# Patient Record
Sex: Male | Born: 1955 | Race: White | Hispanic: No | Marital: Married | Smoking: Never smoker
Health system: Southern US, Community
[De-identification: ages and names within clinical notes are randomized; demographics above are authoritative.]

## PROBLEM LIST (undated history)

## (undated) DIAGNOSIS — I219 Acute myocardial infarction, unspecified: Secondary | ICD-10-CM

## (undated) DIAGNOSIS — Z889 Allergy status to unspecified drugs, medicaments and biological substances status: Secondary | ICD-10-CM

## (undated) DIAGNOSIS — E785 Hyperlipidemia, unspecified: Secondary | ICD-10-CM

## (undated) DIAGNOSIS — T7840XA Allergy, unspecified, initial encounter: Secondary | ICD-10-CM

## (undated) DIAGNOSIS — I10 Essential (primary) hypertension: Secondary | ICD-10-CM

## (undated) DIAGNOSIS — F419 Anxiety disorder, unspecified: Secondary | ICD-10-CM

## (undated) DIAGNOSIS — I2510 Atherosclerotic heart disease of native coronary artery without angina pectoris: Secondary | ICD-10-CM

## (undated) HISTORY — DX: Allergy, unspecified, initial encounter: T78.40XA

## (undated) HISTORY — DX: Hyperlipidemia, unspecified: E78.5

## (undated) HISTORY — PX: CORONARY ANGIOPLASTY: SHX604

---

## 2008-03-01 DIAGNOSIS — I219 Acute myocardial infarction, unspecified: Secondary | ICD-10-CM

## 2008-03-01 HISTORY — DX: Acute myocardial infarction, unspecified: I21.9

## 2009-01-27 ENCOUNTER — Encounter: Payer: Self-pay | Admitting: Emergency Medicine

## 2009-01-27 ENCOUNTER — Inpatient Hospital Stay (HOSPITAL_COMMUNITY): Admission: EM | Admit: 2009-01-27 | Discharge: 2009-01-29 | Payer: Self-pay | Admitting: Cardiovascular Disease

## 2009-01-27 HISTORY — PX: CARDIAC CATHETERIZATION: SHX172

## 2009-02-24 ENCOUNTER — Encounter (HOSPITAL_COMMUNITY): Admission: RE | Admit: 2009-02-24 | Discharge: 2009-02-28 | Payer: Self-pay | Admitting: Cardiovascular Disease

## 2009-03-03 ENCOUNTER — Encounter (HOSPITAL_COMMUNITY): Admission: RE | Admit: 2009-03-03 | Discharge: 2009-04-02 | Payer: Self-pay | Admitting: Cardiovascular Disease

## 2009-04-03 ENCOUNTER — Encounter (HOSPITAL_COMMUNITY): Admission: RE | Admit: 2009-04-03 | Discharge: 2009-05-03 | Payer: Self-pay | Admitting: Cardiovascular Disease

## 2009-04-16 HISTORY — PX: TRANSTHORACIC ECHOCARDIOGRAM: SHX275

## 2009-04-16 HISTORY — PX: CARDIOVASCULAR STRESS TEST: SHX262

## 2009-05-05 ENCOUNTER — Encounter (HOSPITAL_COMMUNITY): Admission: RE | Admit: 2009-05-05 | Discharge: 2009-06-04 | Payer: Self-pay | Admitting: Cardiovascular Disease

## 2010-06-02 LAB — CARDIAC PANEL(CRET KIN+CKTOT+MB+TROPI)
Relative Index: 4.3 — ABNORMAL HIGH (ref 0.0–2.5)
Troponin I: 5.99 ng/mL (ref 0.00–0.06)

## 2010-06-03 LAB — POCT CARDIAC MARKERS
CKMB, poc: 1.1 ng/mL (ref 1.0–8.0)
Myoglobin, poc: 81.9 ng/mL (ref 12–200)
Myoglobin, poc: 85.1 ng/mL (ref 12–200)
Troponin i, poc: <0.05 ng/mL (ref 0.00–0.09)

## 2010-06-03 LAB — COMPREHENSIVE METABOLIC PANEL
ALT: 40 U/L (ref 0–53)
AST: 98 U/L — ABNORMAL HIGH (ref 0–37)
Albumin: 3.7 g/dL (ref 3.5–5.2)
Albumin: 4.5 g/dL (ref 3.5–5.2)
BUN: 13 mg/dL (ref 6–23)
Calcium: 8.7 mg/dL (ref 8.4–10.5)
Calcium: 9.1 mg/dL (ref 8.4–10.5)
Chloride: 106 mEq/L (ref 96–112)
Creatinine, Ser: 0.98 mg/dL (ref 0.4–1.5)
GFR calc Af Amer: >60 mL/min (ref >60)
GFR calc Af Amer: >60 mL/min (ref >60)
GFR calc non Af Amer: >60 mL/min (ref >60)
Sodium: 140 mEq/L (ref 135–145)
Total Bilirubin: 1 mg/dL (ref 0.3–1.2)
Total Protein: 5.7 g/dL — ABNORMAL LOW (ref 6.0–8.3)

## 2010-06-03 LAB — CARDIAC PANEL(CRET KIN+CKTOT+MB+TROPI)
CK, MB: 134.3 ng/mL — ABNORMAL HIGH (ref 0.3–4.0)
CK, MB: 143 ng/mL — ABNORMAL HIGH (ref 0.3–4.0)
Relative Index: 13.3 — ABNORMAL HIGH (ref 0.0–2.5)
Relative Index: 13.4 — ABNORMAL HIGH (ref 0.0–2.5)
Total CK: 1010 U/L — ABNORMAL HIGH (ref 7–232)
Total CK: 768 U/L — ABNORMAL HIGH (ref 7–232)
Troponin I: 14.34 ng/mL (ref 0.00–0.06)

## 2010-06-03 LAB — CBC
MCHC: 34.6 g/dL (ref 30.0–36.0)
MCHC: 35.1 g/dL (ref 30.0–36.0)
MCV: 95.7 fL (ref 78.0–100.0)
Platelets: 189 10*3/uL (ref 150–400)
RBC: 4.34 MIL/uL (ref 4.22–5.81)
RDW: 12.6 % (ref 11.5–15.5)
WBC: 7.5 10*3/uL (ref 4.0–10.5)

## 2010-06-03 LAB — CK TOTAL AND CKMB (NOT AT ARMC)
CK, MB: 49.7 ng/mL — ABNORMAL HIGH (ref 0.3–4.0)
Relative Index: 11.8 — ABNORMAL HIGH (ref 0.0–2.5)
Total CK: 420 U/L — ABNORMAL HIGH (ref 7–232)

## 2010-06-03 LAB — HEMOGLOBIN A1C
Hgb A1c MFr Bld: 5.7 % (ref 4.6–6.1)
Mean Plasma Glucose: 117 mg/dL

## 2010-06-03 LAB — BASIC METABOLIC PANEL
BUN: 16 mg/dL (ref 6–23)
Calcium: 8.6 mg/dL (ref 8.4–10.5)
Chloride: 105 mEq/L (ref 96–112)
Creatinine, Ser: 1.07 mg/dL (ref 0.4–1.5)
GFR calc Af Amer: >60 mL/min (ref >60)

## 2010-06-03 LAB — APTT: aPTT: 26 seconds (ref 24–37)

## 2010-06-03 LAB — DIFFERENTIAL
Basophils Relative: 0 % (ref 0–1)
Eosinophils Absolute: 0.1 10*3/uL (ref 0.0–0.7)
Lymphs Abs: 2.2 10*3/uL (ref 0.7–4.0)
Neutro Abs: 4.6 10*3/uL (ref 1.7–7.7)
Neutrophils Relative %: 61 % (ref 43–77)

## 2010-06-03 LAB — LIPID PANEL
HDL: 35 mg/dL — ABNORMAL LOW (ref >39)
Total CHOL/HDL Ratio: 5.3 RATIO
VLDL: 14 mg/dL (ref 0–40)

## 2010-06-03 LAB — TSH: TSH: 2.161 u[IU]/mL (ref 0.350–4.500)

## 2010-12-09 ENCOUNTER — Emergency Department (HOSPITAL_COMMUNITY)
Admission: EM | Admit: 2010-12-09 | Discharge: 2010-12-09 | Disposition: A | Discharge status: Home / Self Care | Payer: PRIVATE HEALTH INSURANCE | Attending: Emergency Medicine | Admitting: Emergency Medicine

## 2010-12-09 ENCOUNTER — Encounter: Payer: Self-pay | Admitting: *Deleted

## 2010-12-09 DIAGNOSIS — W268XXA Contact with other sharp object(s), not elsewhere classified, initial encounter: Secondary | ICD-10-CM | POA: Insufficient documentation

## 2010-12-09 DIAGNOSIS — I2510 Atherosclerotic heart disease of native coronary artery without angina pectoris: Secondary | ICD-10-CM | POA: Insufficient documentation

## 2010-12-09 DIAGNOSIS — S0100XA Unspecified open wound of scalp, initial encounter: Secondary | ICD-10-CM | POA: Insufficient documentation

## 2010-12-09 DIAGNOSIS — Z7982 Long term (current) use of aspirin: Secondary | ICD-10-CM | POA: Insufficient documentation

## 2010-12-09 DIAGNOSIS — S0101XA Laceration without foreign body of scalp, initial encounter: Secondary | ICD-10-CM

## 2010-12-09 DIAGNOSIS — I252 Old myocardial infarction: Secondary | ICD-10-CM | POA: Insufficient documentation

## 2010-12-09 DIAGNOSIS — R079 Chest pain, unspecified: Secondary | ICD-10-CM | POA: Insufficient documentation

## 2010-12-09 HISTORY — DX: Acute myocardial infarction, unspecified: I21.9

## 2010-12-09 HISTORY — DX: Atherosclerotic heart disease of native coronary artery without angina pectoris: I25.10

## 2010-12-09 MED ORDER — TETANUS-DIPHTH-ACELL PERTUSSIS 5-2.5-18.5 LF-MCG/0.5 IM SUSP
0.5000 mL | Freq: Once | INTRAMUSCULAR | Status: AC
  Administered 2010-12-09: 0.5 mL via INTRAMUSCULAR
  Filled 2010-12-09 (×2): qty 0.5

## 2010-12-09 NOTE — ED Provider Notes (Signed)
Medical screening examination/treatment/procedure(s) were performed by non-physician practitioner and as supervising physician I was immediately available for consultation/collaboration. Devoria Albe, MD, FACEP   Ward Givens, MD 12/09/10 [phone]

## 2010-12-09 NOTE — ED Provider Notes (Signed)
History     CSN: [id number] Arrival date & time: 12/09/2010  6:04 PM  Chief Complaint  Patient presents with  . Head Laceration    (Consider location/radiation/quality/duration/timing/severity/associated sxs/prior treatment) Patient is a 55 y.o. male presenting with scalp laceration. The history is provided by the patient.  Head Laceration This is a new problem. The current episode started today. The problem has been unchanged. Associated symptoms include chest pain. Pertinent negatives include no abdominal pain, arthralgias, coughing or neck pain. Associated symptoms comments: bleeding. The symptoms are aggravated by nothing. Treatments tried: direct pressure. The treatment provided mild relief.    Past Medical History  Diagnosis Date  . MI (myocardial infarction)   . Coronary artery disease     Past Surgical History  Procedure Date  . Coronary angioplasty with stent placement     No family history on file.  History  Substance Use Topics  . Smoking status: Never Smoker   . Smokeless tobacco: Not on file  . Alcohol Use: No      Review of Systems  Constitutional: Negative for activity change.       All ROS Neg except as noted in HPI  HENT: Negative for nosebleeds and neck pain.   Eyes: Negative for photophobia and discharge.  Respiratory: Negative for cough, shortness of breath and wheezing.   Cardiovascular: Positive for chest pain. Negative for palpitations.  Gastrointestinal: Negative for abdominal pain and blood in stool.  Genitourinary: Negative for dysuria, frequency and hematuria.  Musculoskeletal: Negative for back pain and arthralgias.  Skin: Negative.   Neurological: Negative for dizziness, seizures and speech difficulty.  Psychiatric/Behavioral: Negative for hallucinations and confusion.    Allergies  Review of patient's allergies indicates no known allergies.  Home Medications   Current Outpatient Rx  Name Route Sig Dispense Refill  . ASPIRIN EC  81 MG PO TBEC Oral Take 81 mg by mouth daily.      Marland Kitchen CETIRIZINE HCL 10 MG PO TABS Oral Take 10 mg by mouth daily.      Marland Kitchen CLOPIDOGREL BISULFATE 75 MG PO TABS Oral Take 75 mg by mouth at bedtime.      Marland Kitchen EZETIMIBE-SIMVASTATIN 10-40 MG PO TABS Oral Take 1 tablet by mouth at bedtime.      . IBUPROFEN 200 MG PO TABS Oral Take 400 mg by mouth as needed. For pain     . METOPROLOL TARTRATE 25 MG PO TABS Oral Take 6.25 mg by mouth 2 (two) times daily.      . CENTRUM PO LIQD Oral Take 30 mLs by mouth daily.        BP 136/81  Pulse 71  Temp(Src) 97.9 F (36.6 C) (Oral)  Resp 20  Ht 5\' 10"  (1.778 m)  Wt 212 lb (96.163 kg)  BMI 30.42 kg/m2  SpO2 98%  Physical Exam  Nursing note and vitals reviewed. Constitutional: He is oriented to person, place, and time. He appears well-developed and well-nourished.  Non-toxic appearance.  HENT:  Head: Normocephalic.  Right Ear: Tympanic membrane and external ear normal.  Left Ear: Tympanic membrane and external ear normal.       Laceration to the scalp (2.7cm)  Eyes: EOM and lids are normal. Pupils are equal, round, and reactive to light.  Neck: Normal range of motion. Neck supple. Carotid bruit is not present.  Cardiovascular: Normal rate, regular rhythm, normal heart sounds, intact distal pulses and normal pulses.   Pulmonary/Chest: Breath sounds normal. No respiratory distress.  Abdominal: Soft. Bowel  sounds are normal. There is no tenderness. There is no guarding.  Musculoskeletal: Normal range of motion.  Lymphadenopathy:       Head (right side): No submandibular adenopathy present.       Head (left side): No submandibular adenopathy present.    He has no cervical adenopathy.  Neurological: He is alert and oriented to person, place, and time. He has normal strength. No cranial nerve deficit or sensory deficit.  Skin: Skin is warm and dry.  Psychiatric: He has a normal mood and affect. His speech is normal.    ED Course  LACERATION  REPAIR Date/Time: 12/09/2010 7:28 PM Performed by: Kathie Dike Authorized by: Kathie Dike Consent: Verbal consent obtained. Risks and benefits: risks, benefits and alternatives were discussed Consent given by: patient Required items: required blood products, implants, devices, and special equipment available Patient identity confirmed: verbally with patient Time out: Immediately prior to procedure a "time out" was called to verify the correct patient, procedure, equipment, support staff and site/side marked as required. Body area: head/neck Location details: scalp Laceration length: 2.7 cm Vascular damage: no Patient sedated: no Preparation: Patient was prepped and draped in the usual sterile fashion. Irrigation solution: tap water Amount of cleaning: standard Debridement: none Degree of undermining: none Skin closure: glue Approximation: close Patient tolerance: Patient tolerated the procedure well with no immediate complications.   (including critical care time)  Labs Reviewed - No data to display No results found.   1. Laceration of scalp       MDM  I have reviewed nursing notes, vital signs, and all appropriate lab and imaging results for this patient.        Kathie Dike, [place] 12/09/10 [phone]

## 2010-12-09 NOTE — ED Notes (Signed)
Head laceration approx 2 cm to top of head-states was mowing grass and was cut with at tree limb. Bleeding controlled.

## 2012-08-15 ENCOUNTER — Telehealth: Payer: Self-pay | Admitting: Cardiovascular Disease

## 2012-08-15 NOTE — Telephone Encounter (Signed)
Wants to know if he can changer from Vytorin-Ins changed-it will cost more! Please call and let him know what he can take in the place of it!Please call him at --[phone]

## 2012-08-15 NOTE — Telephone Encounter (Signed)
Message forwarded to Dr. Berry for review and further instructions  

## 2012-08-18 MED ORDER — ATORVASTATIN CALCIUM 40 MG PO TABS: 40.0000 mg | ORAL_TABLET | Freq: Every day | ORAL | Status: DC

## 2012-08-18 NOTE — Telephone Encounter (Signed)
Can change to Atorvastatin      (message above received from Dr Allyson Sabal in staff message)  I called pt and advised him of the change to atorvastatin.  I spoke with Connor Sanford and dose should be atorvastatin 40mg  qd.Will send rx to [facility] pharmacy

## 2012-10-16 ENCOUNTER — Ambulatory Visit (INDEPENDENT_AMBULATORY_CARE_PROVIDER_SITE_OTHER): Payer: PRIVATE HEALTH INSURANCE | Admitting: Family Medicine

## 2012-10-16 ENCOUNTER — Encounter: Payer: Self-pay | Admitting: Family Medicine

## 2012-10-16 VITALS — BP 120/74 | HR 70 | Ht 70.0 in | Wt 218.2 lb

## 2012-10-16 DIAGNOSIS — Z125 Encounter for screening for malignant neoplasm of prostate: Secondary | ICD-10-CM

## 2012-10-16 DIAGNOSIS — Z79899 Other long term (current) drug therapy: Secondary | ICD-10-CM

## 2012-10-16 DIAGNOSIS — I2510 Atherosclerotic heart disease of native coronary artery without angina pectoris: Secondary | ICD-10-CM

## 2012-10-16 DIAGNOSIS — E785 Hyperlipidemia, unspecified: Secondary | ICD-10-CM

## 2012-10-16 NOTE — Progress Notes (Signed)
  Subjective:    Patient ID: Connor Sanford, male    DOB: 08/30/1955, 57 y.o.   MRN: [id number]  HPI Exercising regular 3 to 4 out of 7.  Stays active  Pretty good diet,  No colon scheduled--  Compliant with medications.  Overall doing well.  He uses Xanax sparingly for anxiety and/or trouble sleeping    Review of Systems  Constitutional: Negative for fever, activity change and appetite change.  HENT: Negative for congestion, rhinorrhea and neck pain.   Eyes: Negative for discharge.  Respiratory: Negative for cough and wheezing.   Cardiovascular: Negative for chest pain.  Gastrointestinal: Negative for vomiting, abdominal pain and blood in stool.  Genitourinary: Negative for frequency and difficulty urinating.  Skin: Negative for rash.  Allergic/Immunologic: Negative for environmental allergies and food allergies.  Neurological: Negative for weakness and headaches.  Psychiatric/Behavioral: Negative for agitation.       Objective:   Physical Exam  Vitals reviewed. Constitutional: He appears well-developed and well-nourished.  HENT:  Head: Normocephalic and atraumatic.  Right Ear: External ear normal.  Left Ear: External ear normal.  Nose: Nose normal.  Mouth/Throat: Oropharynx is clear and moist.  Eyes: EOM are normal. Pupils are equal, round, and reactive to light.  Neck: Normal range of motion. Neck supple. No thyromegaly present.  Cardiovascular: Normal rate, regular rhythm and normal heart sounds.   No murmur heard. Pulmonary/Chest: Effort normal and breath sounds normal. No respiratory distress. He has no wheezes.  Abdominal: Soft. Bowel sounds are normal. He exhibits no distension and no mass. There is no tenderness.  Genitourinary: Penis normal.  Musculoskeletal: Normal range of motion. He exhibits no edema.  Lymphadenopathy:    He has no cervical adenopathy.  Neurological: He is alert. He exhibits normal muscle tone.  Skin: Skin is warm and dry. No  erythema.  Psychiatric: He has a normal mood and affect. His behavior is normal. Judgment normal.          Assessment & Plan:  Impression wellness exam #2 coronary artery disease. #3 insomnia. #4 hyperlipidemia. Plan appropriate blood work. Given colonoscopy she patient states will call. Diet exercise discussed. Xanax refilled. 0.5 mg #30 one by mouth when necessary with 2 refills. Followup yearly of note cardiologist mainly follows lipids W[facility]

## 2012-10-20 LAB — LIPID PANEL
LDL Cholesterol: 76 mg/dL (ref 0–99)
Triglycerides: 87 mg/dL (ref <150)
VLDL: 17 mg/dL (ref 0–40)

## 2012-10-20 LAB — BASIC METABOLIC PANEL
BUN: 22 mg/dL (ref 6–23)
Chloride: 106 mEq/L (ref 96–112)
Potassium: 4.5 mEq/L (ref 3.5–5.3)

## 2012-10-20 LAB — HEPATIC FUNCTION PANEL
Bilirubin, Direct: 0.2 mg/dL (ref 0.0–0.3)
Indirect Bilirubin: 0.6 mg/dL (ref 0.0–0.9)
Total Bilirubin: 0.8 mg/dL (ref 0.3–1.2)
Total Protein: 6.2 g/dL (ref 6.0–8.3)

## 2012-10-22 ENCOUNTER — Encounter: Payer: Self-pay | Admitting: Family Medicine

## 2012-10-25 ENCOUNTER — Telehealth: Payer: Self-pay | Admitting: Cardiovascular Disease

## 2012-10-25 ENCOUNTER — Telehealth (INDEPENDENT_AMBULATORY_CARE_PROVIDER_SITE_OTHER): Payer: Self-pay | Admitting: *Deleted

## 2012-10-25 NOTE — Telephone Encounter (Signed)
Patient came by to schedule a screening TCS. His PCP is Dr. Gerda Diss. The return phone number is [phone].

## 2012-10-25 NOTE — Telephone Encounter (Signed)
Is calling to see if it is ok for Connor Sanford to hold his plavix for 5 days and aspirin for 2 days .Marland Kitchen Please Call .Marland Kitchen Patient is having a colonoscopy,.. Thanks

## 2012-10-25 NOTE — Telephone Encounter (Signed)
Message forwarded to Dr. Berry for review and further instructions  

## 2012-10-25 NOTE — Telephone Encounter (Signed)
TCS sch'd 12/27/12, patient aware 

## 2012-10-26 ENCOUNTER — Other Ambulatory Visit (INDEPENDENT_AMBULATORY_CARE_PROVIDER_SITE_OTHER): Payer: Self-pay | Admitting: *Deleted

## 2012-10-26 ENCOUNTER — Encounter (INDEPENDENT_AMBULATORY_CARE_PROVIDER_SITE_OTHER): Payer: Self-pay | Admitting: *Deleted

## 2012-10-26 ENCOUNTER — Telehealth (INDEPENDENT_AMBULATORY_CARE_PROVIDER_SITE_OTHER): Payer: Self-pay | Admitting: *Deleted

## 2012-10-26 DIAGNOSIS — Z1211 Encounter for screening for malignant neoplasm of colon: Secondary | ICD-10-CM

## 2012-10-26 MED ORDER — PEG-KCL-NACL-NASULF-NA ASC-C 100 G PO SOLR: 1.0000 | Freq: Once | ORAL | Status: DC

## 2012-10-26 NOTE — Telephone Encounter (Signed)
Patient needs movi prep -- nkda 

## 2012-11-02 NOTE — Telephone Encounter (Signed)
He can hold his aspirin and Plavix for his colonoscopy

## 2012-11-03 NOTE — Telephone Encounter (Signed)
Returned call.  Left message Dr. Allyson Sabal approved pt to hold ASA and Plavix for procedure and letter in Epic to [facility] and Dr. Karilyn Cota).  Call back before 4pm if questions.

## 2012-11-23 ENCOUNTER — Other Ambulatory Visit: Payer: Self-pay | Admitting: Cardiovascular Disease

## 2012-11-24 NOTE — Telephone Encounter (Signed)
Rx was sent to pharmacy electronically. 

## 2012-11-29 ENCOUNTER — Telehealth (INDEPENDENT_AMBULATORY_CARE_PROVIDER_SITE_OTHER): Payer: Self-pay | Admitting: *Deleted

## 2012-11-29 NOTE — Telephone Encounter (Signed)
  Procedure: tcs  Reason/Indication:  screening  Has patient had this procedure before?  no  If so, when, by whom and where?    Is there a family history of colon cancer?  no  Who?  What age when diagnosed?    Is patient diabetic?   no      Does patient have prosthetic heart valve?   no  Do you have a pacemaker?  no  Has patient ever had endocarditis? no  Has patient had joint replacement within last 12 months?  no  Does patient tend to be constipated or take laxatives?   Is patient on Coumadin, Plavix and/or Aspirin? yes  Medications: plavix 75 mg daily, asa 81 mg daily, atorvastatin 40 mg daily, metoprolol 25 mg 1/2 tab bid  Allergies: nkda  Medication Adjustment: plavix 5 days, asa 2 days  Procedure date & time: 12/27/12 at 730

## 2012-11-30 NOTE — Telephone Encounter (Signed)
agree

## 2012-12-12 ENCOUNTER — Encounter (HOSPITAL_COMMUNITY): Payer: Self-pay | Admitting: Pharmacy Technician

## 2012-12-27 ENCOUNTER — Ambulatory Visit (HOSPITAL_COMMUNITY)
Admission: RE | Admit: 2012-12-27 | Discharge: 2012-12-27 | Disposition: A | Discharge status: Home / Self Care | Payer: PRIVATE HEALTH INSURANCE | Source: Ambulatory Visit | Attending: Internal Medicine | Admitting: Internal Medicine

## 2012-12-27 ENCOUNTER — Encounter (HOSPITAL_COMMUNITY)
Admission: RE | Disposition: A | Discharge status: Home / Self Care | Payer: Self-pay | Source: Ambulatory Visit | Attending: Internal Medicine

## 2012-12-27 ENCOUNTER — Encounter (HOSPITAL_COMMUNITY): Payer: Self-pay | Admitting: *Deleted

## 2012-12-27 DIAGNOSIS — K648 Other hemorrhoids: Secondary | ICD-10-CM

## 2012-12-27 DIAGNOSIS — D126 Benign neoplasm of colon, unspecified: Secondary | ICD-10-CM | POA: Insufficient documentation

## 2012-12-27 DIAGNOSIS — K644 Residual hemorrhoidal skin tags: Secondary | ICD-10-CM | POA: Insufficient documentation

## 2012-12-27 DIAGNOSIS — K5730 Diverticulosis of large intestine without perforation or abscess without bleeding: Secondary | ICD-10-CM

## 2012-12-27 DIAGNOSIS — Z1211 Encounter for screening for malignant neoplasm of colon: Secondary | ICD-10-CM

## 2012-12-27 HISTORY — DX: Allergy status to unspecified drugs, medicaments and biological substances: Z88.9

## 2012-12-27 HISTORY — PX: COLONOSCOPY: SHX5424

## 2012-12-27 SURGERY — COLONOSCOPY
Anesthesia: Moderate Sedation

## 2012-12-27 MED ORDER — MEPERIDINE HCL 50 MG/ML IJ SOLN
INTRAMUSCULAR | Status: DC | PRN
  Administered 2012-12-27 (×2): 25 mg via INTRAVENOUS

## 2012-12-27 MED ORDER — MIDAZOLAM HCL 5 MG/5ML IJ SOLN
INTRAMUSCULAR | Status: AC
  Filled 2012-12-27: qty 10

## 2012-12-27 MED ORDER — SIMETHICONE 40 MG/0.6ML PO SUSP
ORAL | Status: DC | PRN
  Administered 2012-12-27: 08:00:00

## 2012-12-27 MED ORDER — SODIUM CHLORIDE 0.9 % IV SOLN
INTRAVENOUS | Status: DC
  Administered 2012-12-27: 08:00:00 via INTRAVENOUS

## 2012-12-27 MED ORDER — MEPERIDINE HCL 50 MG/ML IJ SOLN
INTRAMUSCULAR | Status: AC
  Filled 2012-12-27: qty 1

## 2012-12-27 MED ORDER — MIDAZOLAM HCL 5 MG/5ML IJ SOLN
INTRAMUSCULAR | Status: DC | PRN
  Administered 2012-12-27: 3 mg via INTRAVENOUS
  Administered 2012-12-27: 2 mg via INTRAVENOUS

## 2012-12-27 NOTE — H&P (Signed)
Connor Sanford is an 57 y.o. male.   Chief Complaint: Patient is here for colonoscopy. HPI: Patient is 57 year old Caucasian male who is here for average risk screening colonoscopy. This is patient's first exam. He denies abdominal pain change in his bowel habits or rectal bleeding. Family history is negative for CRC. He has been off his clopidogrel for 5 days  Past Medical History  Diagnosis Date  . Coronary artery disease   . MI (myocardial infarction) 2010  . H/O seasonal allergies     Past Surgical History  Procedure Laterality Date  . Coronary angioplasty with stent placement      Family History  Problem Relation Age of Onset  . Colon cancer Neg Hx    Social History:  reports that he has never smoked. He does not have any smokeless tobacco history on file. He reports that he does not drink alcohol or use illicit drugs.  Allergies: No Known Allergies  Medications Prior to Admission  Medication Sig Dispense Refill  . peg 3350 powder (MOVIPREP) 100 G SOLR Take 1 kit (200 g total) by mouth once.  1 kit  0  . ALPRAZolam (XANAX) 0.5 MG tablet Take 0.5 mg by mouth at bedtime as needed for sleep.      Marland Kitchen aspirin EC 81 MG tablet Take 81 mg by mouth daily.        Marland Kitchen atorvastatin (LIPITOR) 40 MG tablet Take 1 tablet (40 mg total) by mouth daily.  30 tablet  6  . cetirizine (ZYRTEC) 10 MG tablet Take 10 mg by mouth daily as needed for allergies.       Marland Kitchen clopidogrel (PLAVIX) 75 MG tablet Take 75 mg by mouth daily.      Marland Kitchen ibuprofen (ADVIL,MOTRIN) 200 MG tablet Take 400 mg by mouth as needed. For pain       . metoprolol tartrate (LOPRESSOR) 25 MG tablet Take 12.5 mg by mouth 2 (two) times daily.      . Multiple Vitamins-Minerals (MULTIVITAMIN WITH IRON-MINERALS) liquid Take 30 mLs by mouth daily.          No results found for this or any previous visit (from the past 48 hour(s)). No results found.  ROS  Blood pressure 119/82, pulse 62, temperature 97.9 F (36.6 C), temperature  source Oral, resp. rate 16, height 5\' 10"  (1.778 m), weight 215 lb (97.523 kg), SpO2 100.00%. Physical Exam  Constitutional: He appears well-developed and well-nourished.  HENT:  Mouth/Throat: Oropharynx is clear and moist.  Eyes: Conjunctivae are normal. No scleral icterus.  Neck: No thyromegaly present.  Cardiovascular: Normal rate, regular rhythm and normal heart sounds.   No murmur heard. Respiratory: Effort normal and breath sounds normal.  GI: Soft. He exhibits no distension and no mass. There is no tenderness.  Musculoskeletal: He exhibits no edema.  Lymphadenopathy:    He has no cervical adenopathy.  Neurological: He is alert.  Skin: Skin is warm and dry.     Assessment/Plan Average risk screening colonoscopy.  Connor Sanford U 12/27/2012, 7:36 AM

## 2012-12-27 NOTE — Op Note (Signed)
COLONOSCOPY PROCEDURE REPORT  PATIENT:  Connor Sanford  MR#:  [id number] Birthdate:  05-26-1955, 57 y.o., male Endoscopist:  Dr. Malissa Hippo, MD Referred By:  Dr. Vilinda Blanks. Gerda Diss, MD Procedure Date: 12/27/2012  Procedure:   Colonoscopy  Indications:  Patient is 57 year old Caucasian male who is undergoing average risk screening colonoscopy.  Informed Consent:  The procedure and risks were reviewed with the patient and informed consent was obtained.  Medications:  Demerol 50 mg IV Versed 5 mg IV  Description of procedure:  After a digital rectal exam was performed, that colonoscope was advanced from the anus through the rectum and colon to the area of the cecum, ileocecal valve and appendiceal orifice. The cecum was deeply intubated. These structures were well-seen and photographed for the record. From the level of the cecum and ileocecal valve, the scope was slowly and cautiously withdrawn. The mucosal surfaces were carefully surveyed utilizing scope tip to flexion to facilitate fold flattening as needed. The scope was pulled down into the rectum where a thorough exam including retroflexion was performed.  Findings:   Prep excellent. 5 mm polyp cold snare from transverse colon. Two small diverticula in sigmoid colon. Normal rectal mucosa. Small hemorrhoids below the dentate line.   Therapeutic/Diagnostic Maneuvers Performed:  See above  Complications:  None  Cecal Withdrawal Time:  10 minutes  Impression:  Examination performed to cecum. 5 mm polyp cold snare from the transverse colon. Two small diverticula and sigmoid colon. External hemorrhoids.  Recommendations:  Standard instructions given. I will contact patient with biopsy results and further recommendations.  REHMAN,NAJEEB U  12/27/2012 8:11 AM  CC: Dr. Harlow Asa, MD & Dr. Bonnetta Barry ref. provider found

## 2012-12-29 ENCOUNTER — Encounter (HOSPITAL_COMMUNITY): Payer: Self-pay | Admitting: Internal Medicine

## 2013-01-03 ENCOUNTER — Other Ambulatory Visit: Payer: Self-pay | Admitting: Cardiovascular Disease

## 2013-01-03 NOTE — Telephone Encounter (Signed)
Rx was sent to pharmacy electronically. 

## 2013-01-08 ENCOUNTER — Ambulatory Visit (INDEPENDENT_AMBULATORY_CARE_PROVIDER_SITE_OTHER): Payer: PRIVATE HEALTH INSURANCE | Admitting: Cardiovascular Disease

## 2013-01-08 ENCOUNTER — Encounter: Payer: Self-pay | Admitting: Cardiovascular Disease

## 2013-01-08 VITALS — BP 140/68 | HR 63 | Ht 70.0 in | Wt 213.8 lb

## 2013-01-08 DIAGNOSIS — I2510 Atherosclerotic heart disease of native coronary artery without angina pectoris: Secondary | ICD-10-CM

## 2013-01-08 DIAGNOSIS — E785 Hyperlipidemia, unspecified: Secondary | ICD-10-CM

## 2013-01-08 MED ORDER — CLOPIDOGREL BISULFATE 75 MG PO TABS: ORAL_TABLET | ORAL | Status: DC

## 2013-01-08 NOTE — Progress Notes (Signed)
01/08/2013 Connor Sanford   Nov 18, 1955  [id number]  Primary Physician Harlow Asa, MD Primary Cardiologist: Connor Gess MD Roseanne Reno   HPI:  The patient is a 57 year old mildly overweight married Caucasian male, father of 2, who recently retired on May 1 from working at the [employer]. He has a history of hyperlipidemia as well as a strong family history of heart disease and a father who had bypass surgery age 78. 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 He did have moderate inferolateral hypokinesia with an EF of 45% to 50%, with a peak CPK of 1000 and MB of 143. Echocardiogram performed April 06, 2009, showed normal LV systolic function without segmental wall motion abnormalities, and Myoview showed inferoapical scar without ischemia. He did participate in cardiac rehab. He has been asymptomatic since I last saw him . His most recent lipid profile performed 10/20/12 revealed a glucose of 126, LDL 76 HDL of 33.   Current Outpatient Prescriptions  Medication Sig Dispense Refill  . ALPRAZolam (XANAX) 0.5 MG tablet Take 0.5 mg by mouth at bedtime as needed for sleep.      Marland Kitchen aspirin EC 81 MG tablet Take 81 mg by mouth daily.        Marland Kitchen atorvastatin (LIPITOR) 40 MG tablet Take 1 tablet (40 mg total) by mouth daily.  30 tablet  6  . cetirizine (ZYRTEC) 10 MG tablet Take 10 mg by mouth daily as needed for allergies.       Marland Kitchen clopidogrel (PLAVIX) 75 MG tablet Take 1 tablet by mouth daily.  30 tablet  11  . ibuprofen (ADVIL,MOTRIN) 200 MG tablet Take 400 mg by mouth as needed. For pain       . metoprolol tartrate (LOPRESSOR) 25 MG tablet Take 12.5 mg by mouth 2 (two) times daily.      . Multiple Vitamins-Minerals (MULTIVITAMIN WITH IRON-MINERALS) liquid Take 30 mLs by mouth daily.         No current facility-administered medications for this visit.    No  Known Allergies  History   Social History  . Marital Status: Married    Spouse Name: N/A    Number of Children: N/A  . Years of Education: N/A   Occupational History  . Not on file.   Social History Main Topics  . Smoking status: Never Smoker   . Smokeless tobacco: Not on file  . Alcohol Use: No  . Drug Use: No  . Sexual Activity: Not on file   Other Topics Concern  . Not on file   Social History Narrative  . No narrative on file     Review of Systems: General: negative for chills, fever, night sweats or weight changes.  Cardiovascular: negative for chest pain, dyspnea on exertion, edema, orthopnea, palpitations, paroxysmal nocturnal dyspnea or shortness of breath Dermatological: negative for rash Respiratory: negative for cough or wheezing Urologic: negative for hematuria Abdominal: negative for nausea, vomiting, diarrhea, bright red blood per rectum, melena, or hematemesis Neurologic: negative for visual changes, syncope, or dizziness All other systems reviewed and are otherwise negative except as noted above.    Blood pressure 140/68, pulse 63, height 5\' 10"  (1.778 m), weight 213 lb 12.8 oz (96.979 kg).  General appearance: alert and no distress Neck: no adenopathy, no carotid bruit, no JVD, supple, symmetrical, trachea midline and thyroid not enlarged, symmetric, no tenderness/mass/nodules Lungs: clear  to auscultation bilaterally Heart: regular rate and rhythm, S1, S2 normal, no murmur, click, rub or gallop Extremities: extremities normal, atraumatic, no cyanosis or edema  EKG normal sinus rhythm at 63 without ST or T wave changes.  ASSESSMENT AND PLAN:   Atherosclerotic heart disease Status post a STEMI 01/09/09 secondary to occlusion of the large second diagonal branch which was stented using 2 overlapping bare metal stents. He did have moderate anterolateral hypokinesia with an EF of 45-50% with a peak CPK 1000 and MB of 143. Subsequent to the echo performed  04/06/09 showed normal other systolic function without segmental wall motion abnormalities. Myoview showed inferoapical scar without ischemia. He is currently asymptomatic.  Other and unspecified hyperlipidemia On statin therapy with recent lipid profile performed 10/20/12 revealing a glucose of 126 and LDL of 76 and HDL of 33      Connor Gess MD [facility], [facility] 01/08/2013 5:24 PM

## 2013-01-08 NOTE — Assessment & Plan Note (Signed)
Status post a STEMI 01/09/09 secondary to occlusion of the large second diagonal branch which was stented using 2 overlapping bare metal stents. He did have moderate anterolateral hypokinesia with an EF of 45-50% with a peak CPK 1000 and MB of 143. Subsequent to the echo performed 04/06/09 showed normal other systolic function without segmental wall motion abnormalities. Myoview showed inferoapical scar without ischemia. He is currently asymptomatic.

## 2013-01-08 NOTE — Patient Instructions (Signed)
Your physician wants you to follow-up in: 1 year with Dr Berry. You will receive a reminder letter in the mail two months in advance. If you don't receive a letter, please call our office to schedule the follow-up appointment.  

## 2013-01-08 NOTE — Assessment & Plan Note (Signed)
On statin therapy with recent lipid profile performed 10/20/12 revealing a glucose of 126 and LDL of 76 and HDL of 33

## 2013-01-09 ENCOUNTER — Encounter: Payer: Self-pay | Admitting: Cardiovascular Disease

## 2013-01-19 ENCOUNTER — Encounter (INDEPENDENT_AMBULATORY_CARE_PROVIDER_SITE_OTHER): Payer: Self-pay | Admitting: *Deleted

## 2013-03-14 ENCOUNTER — Emergency Department (HOSPITAL_COMMUNITY)
Admission: EM | Admit: 2013-03-14 | Discharge: 2013-03-14 | Disposition: A | Discharge status: Home / Self Care | Payer: Worker's Compensation | Attending: Emergency Medicine | Admitting: Emergency Medicine

## 2013-03-14 ENCOUNTER — Emergency Department (HOSPITAL_COMMUNITY): Payer: Worker's Compensation

## 2013-03-14 ENCOUNTER — Encounter (HOSPITAL_COMMUNITY): Payer: Self-pay | Admitting: Emergency Medicine

## 2013-03-14 DIAGNOSIS — Y9289 Other specified places as the place of occurrence of the external cause: Secondary | ICD-10-CM | POA: Insufficient documentation

## 2013-03-14 DIAGNOSIS — I10 Essential (primary) hypertension: Secondary | ICD-10-CM | POA: Insufficient documentation

## 2013-03-14 DIAGNOSIS — Z7902 Long term (current) use of antithrombotics/antiplatelets: Secondary | ICD-10-CM | POA: Insufficient documentation

## 2013-03-14 DIAGNOSIS — I252 Old myocardial infarction: Secondary | ICD-10-CM | POA: Insufficient documentation

## 2013-03-14 DIAGNOSIS — S40019A Contusion of unspecified shoulder, initial encounter: Secondary | ICD-10-CM | POA: Insufficient documentation

## 2013-03-14 DIAGNOSIS — I2510 Atherosclerotic heart disease of native coronary artery without angina pectoris: Secondary | ICD-10-CM | POA: Insufficient documentation

## 2013-03-14 DIAGNOSIS — S40012A Contusion of left shoulder, initial encounter: Secondary | ICD-10-CM

## 2013-03-14 DIAGNOSIS — Y990 Civilian activity done for income or pay: Secondary | ICD-10-CM | POA: Insufficient documentation

## 2013-03-14 DIAGNOSIS — W010XXA Fall on same level from slipping, tripping and stumbling without subsequent striking against object, initial encounter: Secondary | ICD-10-CM | POA: Insufficient documentation

## 2013-03-14 DIAGNOSIS — R6889 Other general symptoms and signs: Secondary | ICD-10-CM | POA: Insufficient documentation

## 2013-03-14 DIAGNOSIS — E785 Hyperlipidemia, unspecified: Secondary | ICD-10-CM | POA: Insufficient documentation

## 2013-03-14 DIAGNOSIS — Z7982 Long term (current) use of aspirin: Secondary | ICD-10-CM | POA: Insufficient documentation

## 2013-03-14 DIAGNOSIS — Z9861 Coronary angioplasty status: Secondary | ICD-10-CM | POA: Insufficient documentation

## 2013-03-14 DIAGNOSIS — Y9389 Activity, other specified: Secondary | ICD-10-CM | POA: Insufficient documentation

## 2013-03-14 DIAGNOSIS — Z79899 Other long term (current) drug therapy: Secondary | ICD-10-CM | POA: Insufficient documentation

## 2013-03-14 HISTORY — DX: Essential (primary) hypertension: I10

## 2013-03-14 MED ORDER — HYDROCODONE-ACETAMINOPHEN 5-325 MG PO TABS: ORAL_TABLET | ORAL | Status: DC

## 2013-03-14 MED ORDER — HYDROCODONE-ACETAMINOPHEN 5-325 MG PO TABS
2.0000 | ORAL_TABLET | Freq: Once | ORAL | Status: AC
  Administered 2013-03-14: 2 via ORAL
  Filled 2013-03-14: qty 2

## 2013-03-14 NOTE — ED Notes (Addendum)
L Radial pulse present.

## 2013-03-14 NOTE — ED Notes (Signed)
Pt's supervisor in room with pt, reports no drug screen required for workman's comp.

## 2013-03-14 NOTE — ED Notes (Signed)
Pt reports slipped on ice at work and fell.  C/O pain to left shoulder.  Pt says shoulder feels better when he raises his left arm.

## 2013-03-14 NOTE — ED Provider Notes (Signed)
Medical screening examination/treatment/procedure(s) were performed by non-physician practitioner and as supervising physician I was immediately available for consultation/collaboration.  EKG Interpretation   None         Ezequiel Essex, MD 03/14/13 1529

## 2013-03-14 NOTE — ED Provider Notes (Signed)
CSN: [id number]     Arrival date & time 03/14/13  0254 History   First MD Initiated Contact with Patient 03/14/13 [phone]     Chief Complaint  Patient presents with  . Shoulder Pain   (Consider location/radiation/quality/duration/timing/severity/associated sxs/prior Treatment) HPI Comments: Pt sustained a fall from a standing position. He slipped on ice at work. No c/o head, neck, chest, or abd/pelvis.Pt reports being on plavix and asprin.  Patient is a 58 y.o. male presenting with shoulder pain. The history is provided by the patient.  Shoulder Pain This is a new problem. The current episode started today. The problem occurs constantly. The problem has been gradually worsening. Associated symptoms include arthralgias. Pertinent negatives include no abdominal pain, chest pain, coughing, neck pain or numbness. Nothing aggravates the symptoms. He has tried nothing for the symptoms. The treatment provided moderate relief.    Past Medical History  Diagnosis Date  . Coronary artery disease   . MI (myocardial infarction) 2010  . H/O seasonal allergies   . Hyperlipidemia   . Hypertension    Past Surgical History  Procedure Laterality Date  . Coronary angioplasty with stent placement    . Colonoscopy N/A 12/27/2012    Procedure: COLONOSCOPY;  Surgeon: Rogene Houston, MD;  Location: [facility];  Service: Endoscopy;  Laterality: N/A;  730   Family History  Problem Relation Age of Onset  . Colon cancer Neg Hx    History  Substance Use Topics  . Smoking status: Never Smoker   . Smokeless tobacco: Not on file  . Alcohol Use: No    Review of Systems  Constitutional: Negative for activity change.       All ROS Neg except as noted in HPI  HENT: Positive for sneezing. Negative for nosebleeds.   Eyes: Negative for photophobia and discharge.  Respiratory: Negative for cough, shortness of breath and wheezing.   Cardiovascular: Negative for chest pain and palpitations.  Gastrointestinal:  Negative for abdominal pain and blood in stool.  Genitourinary: Negative for dysuria, frequency and hematuria.  Musculoskeletal: Positive for arthralgias. Negative for back pain and neck pain.  Skin: Negative.   Neurological: Negative for dizziness, seizures, speech difficulty and numbness.  Psychiatric/Behavioral: Negative for hallucinations and confusion.    Allergies  Review of patient's allergies indicates no known allergies.  Home Medications   Current Outpatient Rx  Name  Route  Sig  Dispense  Refill  . ALPRAZolam (XANAX) 0.5 MG tablet   Oral   Take 0.5 mg by mouth at bedtime as needed for sleep.         Marland Kitchen aspirin EC 81 MG tablet   Oral   Take 81 mg by mouth daily.           Marland Kitchen atorvastatin (LIPITOR) 40 MG tablet   Oral   Take 1 tablet (40 mg total) by mouth daily.   30 tablet   6   . cetirizine (ZYRTEC) 10 MG tablet   Oral   Take 10 mg by mouth daily as needed for allergies.          Marland Kitchen clopidogrel (PLAVIX) 75 MG tablet      Take 1 tablet by mouth daily.   30 tablet   11   . ibuprofen (ADVIL,MOTRIN) 200 MG tablet   Oral   Take 400 mg by mouth as needed. For pain          . metoprolol tartrate (LOPRESSOR) 25 MG tablet   Oral  Take 12.5 mg by mouth 2 (two) times daily.         . Multiple Vitamins-Minerals (MULTIVITAMIN WITH IRON-MINERALS) liquid   Oral   Take 30 mLs by mouth daily.            BP 136/86  Pulse 67  Temp(Src) 98 F (36.7 C) (Oral)  Resp 18  Ht 5\' 10"  (1.778 m)  Wt 215 lb (97.523 kg)  BMI 30.85 kg/m2  SpO2 96% Physical Exam  Nursing note and vitals reviewed. Constitutional: He is oriented to person, place, and time. He appears well-developed and well-nourished.  Non-toxic appearance.  HENT:  Head: Normocephalic.  Right Ear: Tympanic membrane and external ear normal.  Left Ear: Tympanic membrane and external ear normal.  Eyes: EOM and lids are normal. Pupils are equal, round, and reactive to light.  Neck: Normal range of  motion. Neck supple. Carotid bruit is not present.  Cardiovascular: Normal rate, regular rhythm, normal heart sounds, intact distal pulses and normal pulses.   Pulmonary/Chest: Breath sounds normal. No respiratory distress.  Abdominal: Soft. Bowel sounds are normal. There is no tenderness. There is no guarding.  Musculoskeletal:       Left shoulder: He exhibits decreased range of motion, tenderness, crepitus and pain. He exhibits no swelling, no effusion, no deformity and no laceration.  Lymphadenopathy:       Head (right side): No submandibular adenopathy present.       Head (left side): No submandibular adenopathy present.    He has no cervical adenopathy.  Neurological: He is alert and oriented to person, place, and time. He has normal strength. No cranial nerve deficit or sensory deficit.  Skin: Skin is warm and dry.  Psychiatric: He has a normal mood and affect. His speech is normal.    ED Course  Procedures (including critical care time) Labs Review Labs Reviewed - No data to display Imaging Review No results found.  EKG Interpretation   None       MDM  No diagnosis found. *I have reviewed nursing notes, vital signs, and all appropriate lab and imaging results for this patient.**  Patient sustained a fall from a standing position while at work. He slipped on some ice and injured the left shoulder. X-ray of the left shoulder is negative for fracture or dislocation. The patient is fitted a shoulder immobilizer. Prescription for Norco one every 4 hours is given for pain. Patient has been given instructions on observing for increase swelling. For changes in circulation. And reminded of increased bruising due to the fact that he is on Plavix and aspirin. Patient is advised to return immediately if any changes in the color of his hands or changes in his ability to use his hand and arm. Patient acknowledges understanding of instructions and need for orthopedic followup.  Lenox Ahr, PA-C 03/14/13 [phone]

## 2013-03-14 NOTE — Discharge Instructions (Signed)
The x-ray of your shoulder is negative for fracture or dislocation. Please apply ice today and tomorrow. Please use your shoulder immobilizer until seen by the orthopedic specialist. Please observe for any excessive swelling or bruising, as you are taking Plavix and aspirin. Please observe for any changes in the color of your hands and fingers, or function of your hands and wrist. Please return immediately if any of these changes occur. Contusion A contusion is a deep bruise. Contusions happen when an injury causes bleeding under the skin. Signs of bruising include pain, puffiness (swelling), and discolored skin. The contusion may turn blue, purple, or yellow. HOME CARE   Put ice on the injured area.  Put ice in a plastic bag.  Place a towel between your skin and the bag.  Leave the ice on for 15-20 minutes, 03-04 times a day.  Only take medicine as told by your doctor.  Rest the injured area.  If possible, raise (elevate) the injured area to lessen puffiness. GET HELP RIGHT AWAY IF:   You have more bruising or puffiness.  You have pain that is getting worse.  Your puffiness or pain is not helped by medicine. MAKE SURE YOU:   Understand these instructions.  Will watch your condition.  Will get help right away if you are not doing well or get worse. Document Released: 08/04/2007 Document Revised: 05/10/2011 Document Reviewed: 12/21/2010 [facility] Patient Information 2014 [place], [place].

## 2013-03-15 ENCOUNTER — Encounter: Payer: Self-pay | Admitting: Orthopedic Surgery

## 2013-03-15 ENCOUNTER — Ambulatory Visit (INDEPENDENT_AMBULATORY_CARE_PROVIDER_SITE_OTHER): Payer: Worker's Compensation | Admitting: Orthopedic Surgery

## 2013-03-15 VITALS — BP 133/69 | Ht 70.0 in | Wt 215.0 lb

## 2013-03-15 DIAGNOSIS — S40019A Contusion of unspecified shoulder, initial encounter: Secondary | ICD-10-CM

## 2013-03-15 DIAGNOSIS — M25512 Pain in left shoulder: Secondary | ICD-10-CM | POA: Insufficient documentation

## 2013-03-15 DIAGNOSIS — S40012A Contusion of left shoulder, initial encounter: Secondary | ICD-10-CM | POA: Insufficient documentation

## 2013-03-15 DIAGNOSIS — M25519 Pain in unspecified shoulder: Secondary | ICD-10-CM

## 2013-03-15 MED ORDER — HYDROCODONE-ACETAMINOPHEN 7.5-325 MG PO TABS: 1.0000 | ORAL_TABLET | ORAL | Status: DC | PRN

## 2013-03-15 MED ORDER — IBUPROFEN 800 MG PO TABS: 800.0000 mg | ORAL_TABLET | Freq: Three times a day (TID) | ORAL | Status: DC | PRN

## 2013-03-15 NOTE — Patient Instructions (Signed)
Pick up ibuprofen at pharmacy

## 2013-03-15 NOTE — Progress Notes (Signed)
Patient ID: Connor Sanford, male   DOB: September 27, 1955, 58 y.o.   MRN: [id number]  Chief Complaint  Patient presents with  . Shoulder Pain    Left shoulder contusion d/t injury 03/14/13- Workers Comp   This is a 58 year old male who who works as a Automotive engineer. On January 14 he went outside to chronic the truck he slipped fell on the ice onto his left shoulder. Evaluation at [facility] emergency room x-rays show no fracture or dislocation complains of severe pain he rates at a 10 he says it sharp he says it's constant he has some numbness and swelling in the left arm also his pain is over the proximal humerus  Review of systems history of vomiting swelling of the joints anxiety otherwise normal  Medical history heart attack in 2010, stents placed in 2010. Current medications metoprolol, Plavix, hydrocodone 5 mg, statin drug. Family history heart disease and cancer. 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    BP 133/69  Ht 5\' 10"  (1.778 m)  Wt 215 lb (97.523 kg)  BMI 30.85 kg/m2  General appearance is normal, the patient is alert and oriented x3 with normal mood and affect.  He doesn't have any gait disturbance  He has no bruising over his left shoulder but he is tender over the proximal humerus, a.c. joint nontender, coracoid nontender. Elbow wrist and hand nontender. We will he cannot test his range of motion his active range of motion is limited by pain. Stability test could not be done muscle tone was normal. I could not assess his rotator cuff because of the pain. There is no rash or laceration over the skin there is no ecchymosis under the skin or sensation is normal distally as was the pulse and there is no lymphadenopathy in the left arm axilla or clavicular region  X-ray hospital film negative  Impression shoulder contusion cannot rule out rotator cuff or labral tear  Recommend rest in a sling and immobilizer for 2 weeks. He should take  ibuprofen and Norco 7.5 mg and then come in for reassessment of his shoulder by examination. If we cannot examine them all he has not improved then I would recommend an MRI of the shoulder.  Encounter Diagnosis  Name Primary?  . Left shoulder pain Yes   Meds ordered this encounter  Medications  . ibuprofen (ADVIL,MOTRIN) 800 MG tablet    Sig: Take 1 tablet (800 mg total) by mouth every 8 (eight) hours as needed.    Dispense:  90 tablet    Refill:  5  . HYDROcodone-acetaminophen (NORCO) 7.5-325 MG per tablet    Sig: Take 1 tablet by mouth every 4 (four) hours as needed for moderate pain.    Dispense:  84 tablet    Refill:  0

## 2013-03-29 ENCOUNTER — Other Ambulatory Visit: Payer: Self-pay | Admitting: Cardiovascular Disease

## 2013-03-29 ENCOUNTER — Ambulatory Visit (INDEPENDENT_AMBULATORY_CARE_PROVIDER_SITE_OTHER): Payer: Worker's Compensation | Admitting: Orthopedic Surgery

## 2013-03-29 ENCOUNTER — Encounter: Payer: Self-pay | Admitting: Orthopedic Surgery

## 2013-03-29 VITALS — BP 146/81 | Ht 70.0 in | Wt 215.0 lb

## 2013-03-29 DIAGNOSIS — M75122 Complete rotator cuff tear or rupture of left shoulder, not specified as traumatic: Secondary | ICD-10-CM | POA: Insufficient documentation

## 2013-03-29 DIAGNOSIS — S43429A Sprain of unspecified rotator cuff capsule, initial encounter: Secondary | ICD-10-CM

## 2013-03-29 DIAGNOSIS — M75102 Unspecified rotator cuff tear or rupture of left shoulder, not specified as traumatic: Secondary | ICD-10-CM | POA: Insufficient documentation

## 2013-03-29 DIAGNOSIS — M25512 Pain in left shoulder: Secondary | ICD-10-CM

## 2013-03-29 DIAGNOSIS — M75120 Complete rotator cuff tear or rupture of unspecified shoulder, not specified as traumatic: Secondary | ICD-10-CM

## 2013-03-29 DIAGNOSIS — M25519 Pain in unspecified shoulder: Secondary | ICD-10-CM

## 2013-03-29 MED ORDER — HYDROCODONE-ACETAMINOPHEN 7.5-325 MG PO TABS: 1.0000 | ORAL_TABLET | ORAL | Status: DC | PRN

## 2013-03-29 NOTE — Progress Notes (Signed)
Patient ID: Connor Sanford, male   DOB: Nov 04, 1955, 58 y.o.   MRN: [id number]  Chief Complaint  Patient presents with  . Follow-up    2 week recheck left shoulder contusion DOI 03/14/13    BP 146/81  Ht 5\' 10"  (1.778 m)  Wt 215 lb (97.523 kg)  BMI 30.85 kg/m2  The patient is left shoulder on the 14th comes in after 2 weeks of rest and being out of work and [employer] for pain reports improvement in pain but no improvement in function  Review of systems the pain is now radiating into the forearm he has a bruise near his elbow  Physical Exam(6) GENERAL: normal development   CDV: pulses are normal   Skin: normal  Psychiatric: awake, alert and oriented  Neuro: normal sensation  MSK Gait:  Noncontributory  Left shoulder active range of motion 60 abduction and forward elevation with passive range of motion painful range of motion up to 100 and he has a positive drop test. The greater tuberosity area is tender. He has weakness in his supraspinatus tendon. He has only 40 of external rotation. Neurovascular exam is intact and there is bruising around the elbow of the left arm   Previous x-ray showed no fracture  Assessment: Probable rotator cuff tear left shoulder    Plan: Continue out of work status Continue Norco as needed MRI left shoulder Return after the MRI to discuss surgical options if torn tendon

## 2013-03-29 NOTE — Telephone Encounter (Signed)
Rx was sent to pharmacy electronically. 

## 2013-03-29 NOTE — Patient Instructions (Addendum)
MRI ordered Continue work restriction

## 2013-04-03 ENCOUNTER — Telehealth: Payer: Self-pay | Admitting: Orthopedic Surgery

## 2013-04-03 NOTE — Telephone Encounter (Signed)
Contacted Workers [employer], ph# [phone], received name of adjustor handling claim [id number]), Anderson Malta, her ext# is [phone].  Left detailed voice message requesting approval/authorization for MRI of left shoulder.  Follow up later today if no response.

## 2013-04-05 NOTE — Telephone Encounter (Signed)
04/04/13 Received call back from Ms. Anderson Malta, Workers Comp adjuster,[employer], acknowledging MRI and medical records fax.  She also provided her direct fax [phone], at ph [phone].  She will proceed with contacting their 3rd party contact, [facility] (ph [phone], regarding set up of MRI.  04/05/13 Received fax from [facility], MRI appointment has been scheduled for 04/12/13, 3:30PM, at:  [place], [place], [place], [place] [phone].  Copy of Order faxed to # [phone]. Called patient to notify - states he was also notified by Workers Comp.  He is to follow up here with results-appointment scheduled.

## 2013-04-17 ENCOUNTER — Ambulatory Visit: Payer: Worker's Compensation | Admitting: Orthopedic Surgery

## 2013-04-19 ENCOUNTER — Encounter: Payer: Self-pay | Admitting: Orthopedic Surgery

## 2013-04-19 ENCOUNTER — Ambulatory Visit (INDEPENDENT_AMBULATORY_CARE_PROVIDER_SITE_OTHER): Payer: Worker's Compensation | Admitting: Orthopedic Surgery

## 2013-04-19 VITALS — BP 131/81 | Ht 70.0 in | Wt 215.0 lb

## 2013-04-19 DIAGNOSIS — M75120 Complete rotator cuff tear or rupture of unspecified shoulder, not specified as traumatic: Secondary | ICD-10-CM

## 2013-04-19 DIAGNOSIS — M25512 Pain in left shoulder: Secondary | ICD-10-CM

## 2013-04-19 DIAGNOSIS — M25519 Pain in unspecified shoulder: Secondary | ICD-10-CM

## 2013-04-19 DIAGNOSIS — M75122 Complete rotator cuff tear or rupture of left shoulder, not specified as traumatic: Secondary | ICD-10-CM

## 2013-04-19 MED ORDER — HYDROCODONE-ACETAMINOPHEN 7.5-325 MG PO TABS: 1.0000 | ORAL_TABLET | ORAL | Status: DC | PRN

## 2013-04-19 NOTE — Progress Notes (Signed)
Patient ID: Connor Sanford, male   DOB: Jun 20, 1955, 58 y.o.   MRN: [id number]  Chief Complaint  Patient presents with  . Follow-up    MRI results of left shoulder. Worker's comp, DOI 03-14-13.    BP 131/81  Ht 5\' 10"  (1.778 m)  Wt 215 lb (97.523 kg)  BMI 30.85 kg/m2  Encounter Diagnoses  Name Primary?  . Left shoulder pain Yes  . 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 Patient completed rotator cuff tear the supraspinatus and infraspinatus tendon. This will need repair. There is no atrophy to suggest chronic injury MRI findings suggest acute injury and based on his history and lack of strength in his left arm and mobility it would be with strong degree of medical certainty that he tore this during his fall  I've explained the risks and benefits of surgery postoperative rehabilitation. He'll be out of work for 6 months. In the back to work on a light-duty at 6 months and will not have full strength until one year  There is a slight risk of re\re tear, nonhealing nerve and vascular injury   I reviewed the MRIs and the report I agree with the report. It also shows a large acromial clavicular spur which should be removed.  Her open rotator cuff repair with distal clavicle excision.

## 2013-04-19 NOTE — Patient Instructions (Signed)
Get approval from workers comp for [employer] and distal clavicle

## 2013-04-24 ENCOUNTER — Telehealth: Payer: Self-pay | Admitting: Orthopedic Surgery

## 2013-04-24 NOTE — Telephone Encounter (Signed)
Faxed notes (DOS 04/19/13) to Workers Comp, [employer], Attention Anderson Malta, Fax [phone] to [phone]) / [phone], for review and for pre-authorization of pending surgery.  Patient is aware of status.

## 2013-04-30 NOTE — Telephone Encounter (Signed)
Received faxed response from Workers Comp Utilization Review (ph# [phone]) regarding approval of 2 CPT codes (only):  25003 Repair of ruptured rotator cuff, and [phone] Arthroscopy, shoulder, with rotator cuff repair. Forwarded copy to Dr Aline Brochure to review and advise. Copied in nurse.

## 2013-05-01 ENCOUNTER — Encounter: Payer: Self-pay | Admitting: *Deleted

## 2013-05-01 NOTE — Telephone Encounter (Signed)
05/01/13  Response regarding surgery approval for CPT codes noted per Workers Comp.  Please advise patient of Surgery date, pre-op and post-op appointments, as he wishes to schedule as soon as possible.  Also, patient is asking about how many days he will need to be off of blood thinners prior to surgery.  This is copied pasted note from Staff Message:  RE: About pt's surgery  Connor Raineri, MD  MRN: [id number] DOB: 01/12/1956    Message    [name] so what is the issue ?  We have to use their codes if we want payment   ----- Message -----  From: [name]  Sent: 04/30/2013 6:35 PM  To: [name], MD

## 2013-05-02 NOTE — Telephone Encounter (Signed)
Note: Refer to 05/01/13 letter of medical clearance sent to patient's cardiologist, which will be required prior to scheduling a surgery date.  Patient notified by our nurse of status.

## 2013-05-09 ENCOUNTER — Telehealth: Payer: Self-pay | Admitting: Cardiovascular Disease

## 2013-05-09 DIAGNOSIS — Z01810 Encounter for preprocedural cardiovascular examination: Secondary | ICD-10-CM

## 2013-05-09 DIAGNOSIS — I2510 Atherosclerotic heart disease of native coronary artery without angina pectoris: Secondary | ICD-10-CM

## 2013-05-09 NOTE — Telephone Encounter (Signed)
CALLED INFORMED TAMMY- CLEARANCE IS HAS NOT BEEN RECEIVED. PLEASE RE-FAX TO [phone] SHE STATES SHE WILL FAX NOW    PLACE ON DR BERRY'S CART TO SIGN .

## 2013-05-09 NOTE — Telephone Encounter (Signed)
Note faxed to Tammy 

## 2013-05-09 NOTE — Addendum Note (Signed)
Addended byChauncy Lean. on: 05/09/2013 05:05 PM   Modules accepted: Orders

## 2013-05-09 NOTE — Telephone Encounter (Signed)
Dr Gwenlyn Found reviewed the chart.  He requests that the patient has a stress test and a follow up office visit with him.    Patient notified and stress test ordered

## 2013-05-09 NOTE — Telephone Encounter (Signed)
Wants to know if you received a fax on 05-07-13 to get  his cardiology clarence?

## 2013-05-10 ENCOUNTER — Encounter: Payer: Self-pay | Admitting: Cardiovascular Disease

## 2013-05-10 NOTE — Telephone Encounter (Signed)
Patient surgery status on hold until stress test results from cardiologist.

## 2013-05-14 ENCOUNTER — Encounter: Payer: Self-pay | Admitting: Orthopedic Surgery

## 2013-05-14 ENCOUNTER — Telehealth: Payer: Self-pay | Admitting: Orthopedic Surgery

## 2013-05-14 NOTE — Telephone Encounter (Signed)
Called patient regarding status, as previously noted, surgery on hold for cardiac clearance.  * Left voice message, and patient returned call immediately afterward.  He is aware of status.  Workers Comp is aware of status; spoke with nurse case manager, N[employer], ph# [phone], fax [phone]. Copy of notes faxed, as she was unable to see copy in her imaging from adjustor at Workers Comp, [employer].

## 2013-05-22 ENCOUNTER — Encounter: Payer: Self-pay | Admitting: Orthopedic Surgery

## 2013-05-23 ENCOUNTER — Ambulatory Visit (HOSPITAL_COMMUNITY)
Admission: RE | Admit: 2013-05-23 | Discharge: 2013-05-23 | Disposition: A | Discharge status: Home / Self Care | Payer: Worker's Compensation | Source: Ambulatory Visit | Attending: Cardiovascular Disease | Admitting: Cardiovascular Disease

## 2013-05-23 DIAGNOSIS — I2510 Atherosclerotic heart disease of native coronary artery without angina pectoris: Secondary | ICD-10-CM

## 2013-05-23 DIAGNOSIS — Z01818 Encounter for other preprocedural examination: Secondary | ICD-10-CM | POA: Insufficient documentation

## 2013-05-23 DIAGNOSIS — Z01810 Encounter for preprocedural cardiovascular examination: Secondary | ICD-10-CM

## 2013-05-23 MED ORDER — TECHNETIUM TC 99M SESTAMIBI GENERIC - CARDIOLITE
10.4000 | Freq: Once | INTRAVENOUS | Status: AC | PRN
  Administered 2013-05-23: 10 via INTRAVENOUS

## 2013-05-23 MED ORDER — TECHNETIUM TC 99M SESTAMIBI GENERIC - CARDIOLITE
30.7000 | Freq: Once | INTRAVENOUS | Status: AC | PRN
  Administered 2013-05-23: 30.7 via INTRAVENOUS

## 2013-05-23 MED ORDER — REGADENOSON 0.4 MG/5ML IV SOLN
0.4000 mg | Freq: Once | INTRAVENOUS | Status: AC
  Administered 2013-05-23: 0.4 mg via INTRAVENOUS

## 2013-05-23 NOTE — Procedures (Addendum)
[facility] [address] [place] [address] [phone]  Cardiology Nuclear Med Study  BAUDELIO KARNES is a 58 y.o. male     MRN : [id number]     DOB: [birth year]  Procedure Date: 05/23/2013  Nuclear Med Background Indication for Stress Test:  Evaluation for Ischemia, Surgical Clearance and Stent Patency History:  CAD;MI-01/09/2009;STENT/PTCA--01/09/2009;Prior stress MPI on 04/16/2009-nonischemic;EF=62%;ECHO on 05/04/2009 Cardiac Risk Factors: Family History - CAD, Hypertension, Lipids and Overweight  Symptoms:  Pt denies symptoms at this time.   Nuclear Pre-Procedure Caffeine/Decaff Intake:  1:00am NPO After: 11am   IV Site: R Forearm  IV 0.9% NS with Angio Cath:  22g  Chest Size (in):  46"  IV Started by: Azucena Cecil, RN  Height: 5\' 10"  (1.778 m)  Cup Size: n/a  BMI:  Body mass index is 31.57 kg/(m^2). Weight:  220 lb (99.791 kg)   Tech Comments:  n/a    Nuclear Med Study 1 or 2 day study: 1 day  Stress Test Type:  [place] Provider:  Quay Burow, MD   Resting Radionuclide: Technetium 17m Sestamibi  Resting Radionuclide Dose: 10.4 mCi   Stress Radionuclide:  Technetium 82m Sestamibi  Stress Radionuclide Dose: 30.7 mCi           Stress Protocol Rest HR: 65 Stress HR: 96  Rest BP: 143/103 Stress BP: 151/101  Exercise Time (min): n/a METS: n/a   Predicted Max HR: 164 bpm % Max HR: 58.54 bpm Rate Pressure Product: 14496  Dose of Adenosine (mg):  n/a Dose of Lexiscan: 0.4 mg  Dose of Atropine (mg): n/a Dose of Dobutamine: n/a mcg/kg/min (at max HR)  Stress Test Technologist: Leane Para, CCT Nuclear Technologist: Imagene Riches, CNMT   Rest Procedure:  Myocardial perfusion imaging was performed at rest 45 minutes following the intravenous administration of Technetium 50m Sestamibi. Stress Procedure:  The patient received IV Lexiscan 0.4 mg over 15-seconds.  Technetium 50m  Sestamibi injected at 30-seconds.  There were no significant changes with Lexiscan.  Quantitative spect images were obtained after a 45 minute delay.  Transient Ischemic Dilatation (Normal <1.22):  1.28 Lung/Heart Ratio (Normal <0.45):  0.31 QGS EDV:  90 ml QGS ESV:  35 ml LV Ejection Fraction: 61%       Rest ECG: NSR - Normal EKG  Stress ECG: No significant change from baseline ECG  QPS Raw Data Images:  Normal; no motion artifact; normal heart/lung ratio. Stress Images:  There is decreased uptake in the anterior wall. Rest Images:  There is decreased uptake in the anterior wall. Subtraction (SDS):  There is a fixed defect that is most consistent with a previous infarction.  Impression Exercise Capacity:  Lexiscan with no exercise. BP Response:  Normal blood pressure response. Clinical Symptoms:  No significant symptoms noted. ECG Impression:  No significant ST segment change suggestive of ischemia. Comparison with Prior Nuclear Study: No significant change from previous study  Overall Impression:  Low risk stress nuclear study Small antero apical scar w/o ischemia.  LV Wall Motion:  NL LV Function; NL Wall Motion   [name], MD  05/23/2013 5:15 PM

## 2013-05-29 ENCOUNTER — Telehealth: Payer: Self-pay | Admitting: *Deleted

## 2013-05-29 NOTE — Telephone Encounter (Signed)
Message copied by Chauncy Lean on Tue May 29, 2013 10:43 PM ------      Message from: [name]      Created: Sat May 26, 2013  5:32 PM       Low risk myoview with small anterapical scar w/o ischemia ------

## 2013-05-30 ENCOUNTER — Other Ambulatory Visit: Payer: Self-pay | Admitting: *Deleted

## 2013-05-31 NOTE — Telephone Encounter (Signed)
As of 05/29/13, per clearance letter received from cardiology (per letter in chart), surgery may be scheduled. On 05/30/13, Tammy Long, LPN has contacted patient, has scheduled surgery as previously noted and approved, CPT [phone], for 06/13/13 out-patient surgery at [facility]  On 05/31/13, I called back to Workers Comp([employer]), spoke with Andria Frames, [place], notified of surgery date. Approved as noted.

## 2013-05-31 NOTE — Telephone Encounter (Signed)
Verbal order received from Dr Gwenlyn Found that it is okay for rotator cuff repair.  Pt notified and letter sent

## 2013-06-01 ENCOUNTER — Encounter: Payer: Self-pay | Admitting: Cardiovascular Disease

## 2013-06-01 ENCOUNTER — Ambulatory Visit (INDEPENDENT_AMBULATORY_CARE_PROVIDER_SITE_OTHER): Payer: Worker's Compensation | Admitting: Cardiovascular Disease

## 2013-06-01 VITALS — BP 142/82 | HR 61 | Ht 70.0 in | Wt 219.7 lb

## 2013-06-01 DIAGNOSIS — I2510 Atherosclerotic heart disease of native coronary artery without angina pectoris: Secondary | ICD-10-CM

## 2013-06-01 DIAGNOSIS — E785 Hyperlipidemia, unspecified: Secondary | ICD-10-CM

## 2013-06-01 NOTE — Progress Notes (Signed)
06/01/2013 Connor Sanford   05/03/1955  [id number]  Primary Physician Connor Battiest, MD Primary Cardiologist: Connor Harp MD Connor Sanford   HPI:  The patient is a 58 year old mildly overweight married Caucasian male, father of 2, who recently retired on May 1 from working at the [employer]. He has a history of hyperlipidemia as well as a strong family history of heart disease and a father who had bypass surgery age 66. 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 He did have moderate inferolateral hypokinesia with an EF of 45% to 50%, with a peak CPK of 1000 and MB of 143. Echocardiogram performed April 06, 2009, showed normal LV systolic function without segmental wall motion abnormalities, and Myoview showed inferoapical scar without ischemia. He did participate in cardiac rehab. He has been asymptomatic since I last saw him . His most recent lipid profile performed 10/20/12 revealed a total cholesterol of 126, LDL 76 HDL of 33.he did fall down in January and injured his left shoulder and now requiring rotator cuff repair. A Myoview stress test performed on 05/23/13 for preoperative clearance revealed a small area of apical scar with preserved ejection fraction and no ischemia. Based on this he'll be cleared at low risk.    Current Outpatient Prescriptions  Medication Sig Dispense Refill  . ALPRAZolam (XANAX) 0.5 MG tablet Take 0.5 mg by mouth at bedtime as needed for sleep.      Marland Kitchen aspirin EC 81 MG tablet Take 81 mg by mouth daily.        Marland Kitchen atorvastatin (LIPITOR) 40 MG tablet Take 1 tablet (40 mg total) by mouth daily.  30 tablet  6  . cetirizine (ZYRTEC) 10 MG tablet Take 10 mg by mouth daily as needed for allergies.       Marland Kitchen clopidogrel (PLAVIX) 75 MG tablet Take 1 tablet by mouth daily.  30 tablet  11  . HYDROcodone-acetaminophen (NORCO) 7.5-325 MG per tablet  Take 1 tablet by mouth every 4 (four) hours as needed for moderate pain.  120 tablet  0  . ibuprofen (ADVIL,MOTRIN) 800 MG tablet Take 1 tablet (800 mg total) by mouth every 8 (eight) hours as needed.  90 tablet  5  . metoprolol tartrate (LOPRESSOR) 25 MG tablet TAKE ONE-HALF TABLET BY MOUTH TWICE A DAY  30 tablet  10  . Multiple Vitamins-Minerals (MULTIVITAMIN WITH IRON-MINERALS) liquid Take 30 mLs by mouth daily.         No current facility-administered medications for this visit.    No Known Allergies  History   Social History  . Marital Status: Married    Spouse Name: N/A    Number of Children: N/A  . Years of Education: N/A   Occupational History  . Not on file.   Social History Main Topics  . Smoking status: Never Smoker   . Smokeless tobacco: Not on file  . Alcohol Use: No  . Drug Use: No  . Sexual Activity: Not on file   Other Topics Concern  . Not on file   Social History Narrative  . No narrative on file     Review of Systems: General: negative for chills, fever, night sweats or weight changes.  Cardiovascular: negative for chest pain, dyspnea on exertion, edema, orthopnea, palpitations, paroxysmal nocturnal dyspnea or shortness of breath Dermatological: negative for rash Respiratory: negative for cough or wheezing Urologic: negative for  hematuria Abdominal: negative for nausea, vomiting, diarrhea, bright red blood per rectum, melena, or hematemesis Neurologic: negative for visual changes, syncope, or dizziness All other systems reviewed and are otherwise negative except as noted above.    Blood pressure 142/82, pulse 61, height 5\' 10"  (1.778 m), weight 219 lb 11.2 oz (99.655 kg).  General appearance: alert and no distress Neck: no adenopathy, no carotid bruit, no JVD, supple, symmetrical, trachea midline and thyroid not enlarged, symmetric, no tenderness/mass/nodules Lungs: clear to auscultation bilaterally Heart: regular rate and rhythm, S1, S2 normal,  no murmur, click, rub or gallop Extremities: extremities normal, atraumatic, no cyanosis or edema  EKG normal sinus rhythm at 61  With no ST or T wave changes  ASSESSMENT AND PLAN:   Atherosclerotic heart disease Myocardial infarction 01/27/09 secondary to an occluded large second diagonal branch which I stented using 2 overlapping bare metal stents. He did have moderate inferolateral hypokinesia with an ejection fraction of 45-50%, peak CPK of 1000 with MB of 143. Echocardiogram performed 04/06/09 showed normalization of his LV function. He had a recent Myoview that showed a small area of apical scar without ischemia and normal ejection fraction. Based on this, I am clearing him at low risk for his upcoming left rotator cuff surgery. He can stop his aspirin and Plavix one week prior.  Other and unspecified hyperlipidemia On statin therapy with his most recent lipid profile performed 10/20/12 revealing total cholesterol 126, LDL of 76 and HDL of [place] MD [facility], [facility] 06/01/2013 9:46 AM

## 2013-06-01 NOTE — Assessment & Plan Note (Signed)
On statin therapy with his most recent lipid profile performed 10/20/12 revealing total cholesterol 126, LDL of 76 and HDL of 33

## 2013-06-01 NOTE — Assessment & Plan Note (Signed)
Myocardial infarction 01/27/09 secondary to an occluded large second diagonal branch which I stented using 2 overlapping bare metal stents. He did have moderate inferolateral hypokinesia with an ejection fraction of 45-50%, peak CPK of 1000 with MB of 143. Echocardiogram performed 04/06/09 showed normalization of his LV function. He had a recent Myoview that showed a small area of apical scar without ischemia and normal ejection fraction. Based on this, I am clearing him at low risk for his upcoming left rotator cuff surgery. He can stop his aspirin and Plavix one week prior.

## 2013-06-01 NOTE — Patient Instructions (Signed)
Dr Gwenlyn Found wants you to follow-up in: 1 Year You will receive a reminder letter in the mail two months in advance. If you don't receive a letter, please call our office to schedule the follow-up appointment.  You are cleared for surgery with Dr Aline Brochure  Your physician has recommended that you Stop Plavix and Aspirin 3 days before surgery

## 2013-06-04 ENCOUNTER — Encounter: Payer: Self-pay | Admitting: Orthopedic Surgery

## 2013-06-04 ENCOUNTER — Telehealth: Payer: Self-pay | Admitting: Orthopedic Surgery

## 2013-06-04 NOTE — Telephone Encounter (Addendum)
**  Note, as previously noted (04/30/13-05/01/13):   ** 04/30/13, Per faxed response from [employer] (ph# [phone]) regarding approval of 2 CPT codes (only): 23343 Repair of ruptured rotator cuff, and 29827 Arthroscopy, shoulder, with rotator cuff repair.   Dr Aline Brochure aware.

## 2013-06-04 NOTE — Telephone Encounter (Signed)
**  NOTE:  Approved CPT codes are, as previously Noted in chart: **Per faxed response from Workers [employer] Utilization Review (ph# [phone]) regarding approval of 2 CPT codes (only): 76283 Repair of ruptured rotator cuff, and [phone] Arthroscopy, shoulder, with rotator cuff repair. Forwarded copy to Dr Aline Brochure to review and advise. Copied in nurse.

## 2013-06-05 ENCOUNTER — Encounter (HOSPITAL_COMMUNITY): Payer: Self-pay | Admitting: Pharmacy Technician

## 2013-06-08 ENCOUNTER — Encounter (HOSPITAL_COMMUNITY)
Admission: RE | Admit: 2013-06-08 | Discharge: 2013-06-08 | Disposition: A | Discharge status: Home / Self Care | Payer: Worker's Compensation | Source: Ambulatory Visit | Attending: Orthopedic Surgery | Admitting: Orthopedic Surgery

## 2013-06-08 ENCOUNTER — Encounter (HOSPITAL_COMMUNITY): Payer: Self-pay

## 2013-06-08 DIAGNOSIS — Z01812 Encounter for preprocedural laboratory examination: Secondary | ICD-10-CM | POA: Insufficient documentation

## 2013-06-08 DIAGNOSIS — Z01810 Encounter for preprocedural cardiovascular examination: Secondary | ICD-10-CM | POA: Insufficient documentation

## 2013-06-08 LAB — HEMOGLOBIN AND HEMATOCRIT, BLOOD
HCT: 41.7 % (ref 39.0–52.0)
HEMOGLOBIN: 14.9 g/dL (ref 13.0–17.0)

## 2013-06-08 LAB — BASIC METABOLIC PANEL
BUN: 18 mg/dL (ref 6–23)
CO2: 30 mEq/L (ref 19–32)
CREATININE: 0.99 mg/dL (ref 0.50–1.35)
Calcium: 9.3 mg/dL (ref 8.4–10.5)
Chloride: 102 mEq/L (ref 96–112)
GFR calc Af Amer: >90 mL/min (ref >90)
GFR, EST NON AFRICAN AMERICAN: 90 mL/min — AB (ref >90)
Glucose, Bld: 115 mg/dL — ABNORMAL HIGH (ref 70–99)
Potassium: 4.9 mEq/L (ref 3.7–5.3)
Sodium: 141 mEq/L (ref 137–147)

## 2013-06-08 NOTE — Patient Instructions (Signed)
Connor Sanford  06/08/2013   Your procedure is scheduled on:   06/13/2013  Report to [facility] at  73  AM.  Call this number if you have problems the morning of surgery: [phone]   Remember:   Do not eat food or drink liquids after midnight.   Take these medicines the morning of surgery with A SIP OF WATER:  Xanax, zyrtec, hydrocodone, metoprolol   Do not wear jewelry, make-up or nail polish.  Do not wear lotions, powders, or perfumes.   Do not shave 48 hours prior to surgery. Men may shave face and neck.  Do not bring valuables to the hospital.  [facility] is not responsible for any belongings or valuables.               Contacts, dentures or bridgework may not be worn into surgery.  Leave suitcase in the car. After surgery it may be brought to your room.  For patients admitted to the hospital, discharge time is determined by your treatment team.               Patients discharged the day of surgery will not be allowed to drive home.  Name and phone number of your driver: family  Special Instructions: Shower using CHG 2 nights before surgery and the night before surgery.  If you shower the day of surgery use CHG.  Use special wash - you have one bottle of CHG for all showers.  You should use approximately 1/3 of the bottle for each shower.   Please read over the following fact sheets that you were given: Pain Booklet, Coughing and Deep Breathing, Surgical Site Infection Prevention, Anesthesia Post-op Instructions and Care and Recovery After Surgery Rotator Cuff Injury Rotator cuff injury is any type of injury to the set of muscles and tendons that make up the stabilizing unit of your shoulder. This unit holds the ball of your upper arm bone (humerus) in the socket of your shoulder blade (scapula).  CAUSES Injuries to your rotator cuff most commonly come from sports or activities that cause your arm to be moved repeatedly over your head. Examples of this include throwing,  weight lifting, swimming, or racquet sports. Long lasting (chronic) irritation of your rotator cuff can cause soreness and swelling (inflammation), bursitis, and eventual damage to your tendons, such as a tear (rupture). SIGNS AND SYMPTOMS Acute rotator cuff tear:  Sudden tearing sensation followed by severe pain shooting from your upper shoulder down your arm toward your elbow.  Decreased range of motion of your shoulder because of pain and muscle spasm.  Severe pain.  Inability to raise your arm out to the side because of pain and loss of muscle power (large tears). Chronic rotator cuff tear:  Pain that usually is worse at night and may interfere with sleep.  Gradual weakness and decreased shoulder motion as the pain worsens.  Decreased range of motion. Rotator cuff tendinitis:  Deep ache in your shoulder and the outside upper arm over your shoulder.  Pain that comes on gradually and becomes worse when lifting your arm to the side or turning it inward. DIAGNOSIS Rotator cuff injury is diagnosed through a medical history, physical exam, and imaging exam. The medical history helps determine the type of rotator cuff injury. Your health care provider will look at your injured shoulder, feel the injured area, and ask you to move your shoulder in different positions. X-ray exams typically are done to rule  out other causes of shoulder pain, such as fractures. MRI is the exam of choice for the most severe shoulder injuries because the images show muscles and tendons.  TREATMENT  Chronic tear:  Medicine for pain, such as acetaminophen or ibuprofen.  Physical therapy and range-of-motion exercises may be helpful in maintaining shoulder function and strength.  Steroid injections into your shoulder joint.  Surgical repair of the rotator cuff if the injury does not heal with noninvasive treatment. Acute tear:  Anti-inflammatory medicines such as ibuprofen and naproxen to help reduce pain  and swelling.  A sling to help support your arm and rest your rotator cuff muscles. Long-term use of a sling is not advised. It may cause significant stiffening of the shoulder joint.  Surgery may be considered within a few weeks, especially in younger, active people, to return the shoulder to full function.  Indications for surgical treatment include the following:  Age younger than 19 years.  Rotator cuff tears that are complete.  Physical therapy, rest, and anti-inflammatory medicines have been used for 6 8 weeks, with no improvement.  Employment or sporting activity that requires constant shoulder use. Tendinitis:  Anti-inflammatory medicines such as ibuprofen and naproxen to help reduce pain and swelling.  A sling to help support your arm and rest your rotator cuff muscles. Long-term use of a sling is not advised. It may cause significant stiffening of the shoulder joint.  Severe tendinitis may require:  Steroid injections into your shoulder joint.  Physical therapy.  Surgery. HOME CARE INSTRUCTIONS   Apply ice to your injury:  Put ice in a plastic bag.  Place a towel between your skin and the bag.  Leave the ice on for 20 minutes, 2 3 times a day.  If you have a shoulder immobilizer (sling and straps), wear it until told otherwise by your health care provider.  You may want to sleep on several pillows or in a recliner at night to lessen swelling and pain.  Only take over-the-counter or prescription medicines for pain, discomfort, or fever as directed by your health care provider.  Do simple hand squeezing exercises with a soft rubber ball to decrease hand swelling. SEEK MEDICAL CARE IF:   Your shoulder pain increases, or new pain or numbness develops in your arm, hand, or fingers.  Your hand or fingers are colder than your other hand. SEEK IMMEDIATE MEDICAL CARE IF:   Your arm, hand, or fingers are numb or tingling.  Your arm, hand, or fingers are  increasingly swollen and painful, or they turn white or blue. MAKE SURE YOU:  Understand these instructions.  Will watch your condition.  Will get help right away if you are not doing well or get worse. Document Released: 02/13/2000 Document Revised: 12/06/2012 Document Reviewed: 09/27/2012 [facility] Patient Information 2014 [place]. PATIENT INSTRUCTIONS POST-ANESTHESIA  IMMEDIATELY FOLLOWING SURGERY:  Do not drive or operate machinery for the first twenty four hours after surgery.  Do not make any important decisions for twenty four hours after surgery or while taking narcotic pain medications or sedatives.  If you develop intractable nausea and vomiting or a severe headache please notify your doctor immediately.  FOLLOW-UP:  Please make an appointment with your surgeon as instructed. You do not need to follow up with anesthesia unless specifically instructed to do so.  WOUND CARE INSTRUCTIONS (if applicable):  Keep a dry clean dressing on the anesthesia/puncture wound site if there is drainage.  Once the wound has quit draining you may  leave it open to air.  Generally you should leave the bandage intact for twenty four hours unless there is drainage.  If the epidural site drains for more than 36-48 hours please call the anesthesia department.  QUESTIONS?:  Please feel free to call your physician or the hospital operator if you have any questions, and they will be happy to assist you.

## 2013-06-08 NOTE — Addendum Note (Signed)
Addended by: Diana Eves on: 06/08/2013 02:17 PM   Modules accepted: Orders

## 2013-06-08 NOTE — Pre-Procedure Instructions (Signed)
Patient given information to sign up for my chart at home. 

## 2013-06-12 NOTE — H&P (Signed)
Connor Sanford is an 58 y.o. male.   Chief Complaint: Pain left shoulder  HPI: This 58 year old male was injured at work on 03/14/2013 working for the [employer] slipped on ice. He was initially treated with a sling and immobilization with ibuprofen and Norco for pain did not improve an MRI showed rotator cuff tear now presents for surgery.  Past Medical History  Diagnosis Date  . Coronary artery disease   . MI (myocardial infarction) 2010  . H/O seasonal allergies   . Hyperlipidemia   . Hypertension     Past Surgical History  Procedure Laterality Date  . Colonoscopy N/A 12/27/2012    Procedure: COLONOSCOPY;  Surgeon: Rogene Houston, MD;  Location: [facility];  Service: Endoscopy;  Laterality: N/A;  730  . Cardiovascular stress test  04/16/2009    Mild perfusion defect seen in Mid Anterior, Apical Anterior, and Apical regions - consistent with infarct/scar. No scintigraphic evidence of inducible myocardial ischemia. EKG negative for ischemia. No ECG changes.  . Transthoracic echocardiogram  04/16/2009    EF >55%, no significant valvular abnormalities.  . Cardiac catheterization  01/27/2009    Occluded second diagonal branch-stented with a 2.25x75m Integrity Medtronic bare-metal stent at 12 atm (2.38-mm) resulting in reduction of 100% stenosis to 0% residual. Small fairly focal stenosis just distalof stented segment-stented with a 2.25x848mIntegrity Medtronic bare-metal stent at 12 atm, reduced to 0% residual with TIMI3 flow.  . Coronary angioplasty      2 stents    Family History  Problem Relation Age of Onset  . Colon cancer Neg Hx    Social History:  reports that he has never smoked. He does not have any smokeless tobacco history on file. He reports that he does not drink alcohol or use illicit drugs.  Allergies: No Known Allergies  No prescriptions prior to admission   Chief Complaint   Patient presents with   .  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 [facility] x-rays show no fracture or dislocation complains of severe pain he rates at a 10 he says it sharp he says it's constant he has some numbness and swelling in the left arm also his pain is over the proximal humerus  Review of systems history of vomiting swelling of the joints anxiety otherwise normal  Medical history heart attack in 2010, stents placed in 2010. Current medications metoprolol, Plavix, hydrocodone 5 mg, statin drug. Family history heart disease and cancer. 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    BP 133/69  Ht 5' 10"  (1.778 m)  Wt 215 lb (97.523 kg)  BMI 30.85 kg/m2  General appearance is normal, the patient is alert and oriented x3 with normal mood and affect.  He doesn't have any gait disturbance  He has no bruising over his left shoulder but he is tender over the proximal humerus, a.c. joint nontender, coracoid nontender. Elbow wrist and hand nontender. We will he cannot test his range of motion his active range of motion is limited by pain. Stability test could not be done muscle tone was normal. I could not assess his rotator cuff because of the pain. There is no rash or laceration  over the skin there is no ecchymosis under the skin or sensation is normal distally as was the pulse and there is no lymphadenopathy in the left arm axilla or clavicular region  X-ray hospital film negative  The MRI scan from an outside facility shows a large full-thickness full with retracted tear infraspinatus and supraspinatus or dislocation of the long head of the biceps tendon an intrasubstance tear of the subscapularis tendon with marked hypertrophy of the a.c. joint and glenohumeral joint effusion of note there is  superior head migration of the humerus with articulation of the humeral head with the acromion    No results found for this or any previous visit (from the past 48 hour(s)). No results found.  ROS  There were no vitals taken for this visit. Physical Exam   Assessment/Plan Torn left rotator cuff  Procedure open rotator cuff repair left shoulder, a.c. joint resection  [name] 06/12/2013, 3:46 PM

## 2013-06-13 ENCOUNTER — Emergency Department (HOSPITAL_COMMUNITY)
Admission: EM | Admit: 2013-06-13 | Discharge: 2013-06-13 | Disposition: A | Discharge status: Home / Self Care | Payer: Worker's Compensation | Attending: Emergency Medicine | Admitting: Emergency Medicine

## 2013-06-13 ENCOUNTER — Encounter (HOSPITAL_COMMUNITY): Payer: Worker's Compensation | Admitting: Anesthesiology

## 2013-06-13 ENCOUNTER — Encounter (HOSPITAL_COMMUNITY): Payer: Self-pay

## 2013-06-13 ENCOUNTER — Ambulatory Visit (HOSPITAL_COMMUNITY): Payer: Worker's Compensation | Admitting: Anesthesiology

## 2013-06-13 ENCOUNTER — Ambulatory Visit (HOSPITAL_COMMUNITY)
Admission: RE | Admit: 2013-06-13 | Discharge: 2013-06-13 | Disposition: A | Discharge status: Home / Self Care | Payer: Worker's Compensation | Source: Ambulatory Visit | Attending: Orthopedic Surgery | Admitting: Orthopedic Surgery

## 2013-06-13 ENCOUNTER — Encounter (HOSPITAL_COMMUNITY): Payer: Self-pay | Admitting: Emergency Medicine

## 2013-06-13 ENCOUNTER — Telehealth: Payer: Self-pay | Admitting: Orthopedic Surgery

## 2013-06-13 ENCOUNTER — Encounter (HOSPITAL_COMMUNITY)
Admission: RE | Disposition: A | Discharge status: Home / Self Care | Payer: Self-pay | Source: Ambulatory Visit | Attending: Orthopedic Surgery

## 2013-06-13 DIAGNOSIS — W010XXA Fall on same level from slipping, tripping and stumbling without subsequent striking against object, initial encounter: Secondary | ICD-10-CM | POA: Insufficient documentation

## 2013-06-13 DIAGNOSIS — E785 Hyperlipidemia, unspecified: Secondary | ICD-10-CM | POA: Insufficient documentation

## 2013-06-13 DIAGNOSIS — Z79899 Other long term (current) drug therapy: Secondary | ICD-10-CM | POA: Insufficient documentation

## 2013-06-13 DIAGNOSIS — I2510 Atherosclerotic heart disease of native coronary artery without angina pectoris: Secondary | ICD-10-CM | POA: Insufficient documentation

## 2013-06-13 DIAGNOSIS — I252 Old myocardial infarction: Secondary | ICD-10-CM | POA: Insufficient documentation

## 2013-06-13 DIAGNOSIS — I10 Essential (primary) hypertension: Secondary | ICD-10-CM | POA: Insufficient documentation

## 2013-06-13 DIAGNOSIS — M25519 Pain in unspecified shoulder: Secondary | ICD-10-CM

## 2013-06-13 DIAGNOSIS — M25512 Pain in left shoulder: Secondary | ICD-10-CM

## 2013-06-13 DIAGNOSIS — Y838 Other surgical procedures as the cause of abnormal reaction of the patient, or of later complication, without mention of misadventure at the time of the procedure: Secondary | ICD-10-CM | POA: Insufficient documentation

## 2013-06-13 DIAGNOSIS — Z7982 Long term (current) use of aspirin: Secondary | ICD-10-CM | POA: Insufficient documentation

## 2013-06-13 DIAGNOSIS — Z9861 Coronary angioplasty status: Secondary | ICD-10-CM | POA: Insufficient documentation

## 2013-06-13 DIAGNOSIS — M75122 Complete rotator cuff tear or rupture of left shoulder, not specified as traumatic: Secondary | ICD-10-CM

## 2013-06-13 DIAGNOSIS — Y9269 Other specified industrial and construction area as the place of occurrence of the external cause: Secondary | ICD-10-CM | POA: Insufficient documentation

## 2013-06-13 DIAGNOSIS — R112 Nausea with vomiting, unspecified: Secondary | ICD-10-CM | POA: Insufficient documentation

## 2013-06-13 DIAGNOSIS — M75102 Unspecified rotator cuff tear or rupture of left shoulder, not specified as traumatic: Secondary | ICD-10-CM

## 2013-06-13 DIAGNOSIS — IMO0002 Reserved for concepts with insufficient information to code with codable children: Secondary | ICD-10-CM | POA: Insufficient documentation

## 2013-06-13 DIAGNOSIS — S43429A Sprain of unspecified rotator cuff capsule, initial encounter: Secondary | ICD-10-CM | POA: Insufficient documentation

## 2013-06-13 DIAGNOSIS — M75120 Complete rotator cuff tear or rupture of unspecified shoulder, not specified as traumatic: Secondary | ICD-10-CM

## 2013-06-13 DIAGNOSIS — R1110 Vomiting, unspecified: Secondary | ICD-10-CM

## 2013-06-13 DIAGNOSIS — Z6830 Body mass index (BMI) 30.0-30.9, adult: Secondary | ICD-10-CM | POA: Insufficient documentation

## 2013-06-13 DIAGNOSIS — M19019 Primary osteoarthritis, unspecified shoulder: Secondary | ICD-10-CM | POA: Insufficient documentation

## 2013-06-13 HISTORY — PX: RESECTION DISTAL CLAVICAL: SHX5053

## 2013-06-13 HISTORY — PX: SHOULDER OPEN ROTATOR CUFF REPAIR: SHX2407

## 2013-06-13 SURGERY — REPAIR, ROTATOR CUFF, OPEN
Anesthesia: General | Site: Shoulder | Laterality: Left

## 2013-06-13 MED ORDER — FENTANYL CITRATE 0.05 MG/ML IJ SOLN
INTRAMUSCULAR | Status: AC
  Filled 2013-06-13: qty 5

## 2013-06-13 MED ORDER — ONDANSETRON HCL 4 MG/2ML IJ SOLN
4.0000 mg | Freq: Once | INTRAMUSCULAR | Status: AC
  Administered 2013-06-13: 4 mg via INTRAVENOUS

## 2013-06-13 MED ORDER — BUPIVACAINE-EPINEPHRINE PF 0.5-1:200000 % IJ SOLN
INTRAMUSCULAR | Status: AC
  Filled 2013-06-13: qty 20

## 2013-06-13 MED ORDER — FENTANYL CITRATE 0.05 MG/ML IJ SOLN
INTRAMUSCULAR | Status: DC | PRN
  Administered 2013-06-13 (×7): 50 ug via INTRAVENOUS

## 2013-06-13 MED ORDER — SUCCINYLCHOLINE CHLORIDE 20 MG/ML IJ SOLN
INTRAMUSCULAR | Status: AC
  Filled 2013-06-13: qty 1

## 2013-06-13 MED ORDER — SUCCINYLCHOLINE CHLORIDE 20 MG/ML IJ SOLN
INTRAMUSCULAR | Status: DC | PRN
Start: 2013-06-13 — End: 2013-06-13
  Administered 2013-06-13: 120 mg via INTRAVENOUS

## 2013-06-13 MED ORDER — PROPOFOL 10 MG/ML IV BOLUS
INTRAVENOUS | Status: DC | PRN
  Administered 2013-06-13: 175 mg via INTRAVENOUS

## 2013-06-13 MED ORDER — GLYCOPYRROLATE 0.2 MG/ML IJ SOLN
INTRAMUSCULAR | Status: DC | PRN
  Administered 2013-06-13: .7 mg via INTRAVENOUS

## 2013-06-13 MED ORDER — LACTATED RINGERS IV SOLN
INTRAVENOUS | Status: DC
  Administered 2013-06-13 (×2): via INTRAVENOUS

## 2013-06-13 MED ORDER — ONDANSETRON 8 MG PO TBDP
8.0000 mg | ORAL_TABLET | Freq: Once | ORAL | Status: AC
  Administered 2013-06-13: 8 mg via ORAL
  Filled 2013-06-13: qty 1

## 2013-06-13 MED ORDER — ONDANSETRON HCL 4 MG/2ML IJ SOLN
INTRAMUSCULAR | Status: AC
  Administered 2013-06-13: 8 mg
  Filled 2013-06-13: qty 4

## 2013-06-13 MED ORDER — ONDANSETRON HCL 4 MG/2ML IJ SOLN
INTRAMUSCULAR | Status: AC
  Administered 2013-06-13: 4 mg via INTRAVENOUS
  Filled 2013-06-13: qty 2

## 2013-06-13 MED ORDER — OXYCODONE-ACETAMINOPHEN 5-325 MG PO TABS
1.0000 | ORAL_TABLET | Freq: Once | ORAL | Status: AC
  Administered 2013-06-13: 1 via ORAL
  Filled 2013-06-13: qty 1

## 2013-06-13 MED ORDER — ONDANSETRON 8 MG/NS 50 ML IVPB
8.0000 mg | Freq: Once | INTRAVENOUS | Status: DC
  Filled 2013-06-13: qty 8

## 2013-06-13 MED ORDER — OXYCODONE HCL 5 MG PO TABS
5.0000 mg | ORAL_TABLET | Freq: Once | ORAL | Status: AC
  Administered 2013-06-13: 5 mg via ORAL

## 2013-06-13 MED ORDER — METHOCARBAMOL 500 MG PO TABS: 500.0000 mg | ORAL_TABLET | Freq: Four times a day (QID) | ORAL | Status: DC

## 2013-06-13 MED ORDER — CEFAZOLIN SODIUM-DEXTROSE 2-3 GM-% IV SOLR
2.0000 g | INTRAVENOUS | Status: AC
  Administered 2013-06-13: 2 g via INTRAVENOUS
  Filled 2013-06-13: qty 50

## 2013-06-13 MED ORDER — PREGABALIN 50 MG PO CAPS
ORAL_CAPSULE | ORAL | Status: AC
  Filled 2013-06-13: qty 1

## 2013-06-13 MED ORDER — LIDOCAINE HCL (PF) 1 % IJ SOLN
INTRAMUSCULAR | Status: AC
  Filled 2013-06-13: qty 5

## 2013-06-13 MED ORDER — MORPHINE SULFATE 4 MG/ML IJ SOLN
4.0000 mg | Freq: Once | INTRAMUSCULAR | Status: AC
  Administered 2013-06-13: 4 mg via INTRAVENOUS
  Filled 2013-06-13: qty 1

## 2013-06-13 MED ORDER — ONDANSETRON HCL 4 MG/2ML IJ SOLN
INTRAMUSCULAR | Status: AC
  Filled 2013-06-13: qty 2

## 2013-06-13 MED ORDER — BUPIVACAINE-EPINEPHRINE PF 0.5-1:200000 % IJ SOLN
INTRAMUSCULAR | Status: DC | PRN
  Administered 2013-06-13: 60 mL

## 2013-06-13 MED ORDER — FENTANYL CITRATE 0.05 MG/ML IJ SOLN
75.0000 ug | INTRAMUSCULAR | Status: DC | PRN
  Filled 2013-06-13: qty 2

## 2013-06-13 MED ORDER — MIDAZOLAM HCL 2 MG/2ML IJ SOLN
1.0000 mg | INTRAMUSCULAR | Status: DC | PRN
  Administered 2013-06-13: 2 mg via INTRAVENOUS

## 2013-06-13 MED ORDER — ROCURONIUM BROMIDE 50 MG/5ML IV SOLN
INTRAVENOUS | Status: AC
  Filled 2013-06-13: qty 1

## 2013-06-13 MED ORDER — ONDANSETRON 4 MG PO TBDP: 4.0000 mg | ORAL_TABLET | Freq: Three times a day (TID) | ORAL | Status: DC | PRN

## 2013-06-13 MED ORDER — OXYCODONE-ACETAMINOPHEN 5-325 MG PO TABS: 1.0000 | ORAL_TABLET | ORAL | Status: DC | PRN

## 2013-06-13 MED ORDER — MIDAZOLAM HCL 2 MG/2ML IJ SOLN
INTRAMUSCULAR | Status: AC
  Filled 2013-06-13: qty 2

## 2013-06-13 MED ORDER — ROCURONIUM BROMIDE 100 MG/10ML IV SOLN
INTRAVENOUS | Status: DC | PRN
  Administered 2013-06-13 (×3): 10 mg via INTRAVENOUS
  Administered 2013-06-13: 23 mg via INTRAVENOUS
  Administered 2013-06-13: 7 mg via INTRAVENOUS
  Administered 2013-06-13: 5 mg via INTRAVENOUS

## 2013-06-13 MED ORDER — SCOPOLAMINE 1 MG/3DAYS TD PT72
1.0000 | MEDICATED_PATCH | TRANSDERMAL | Status: DC
  Administered 2013-06-13: 1.5 mg via TRANSDERMAL
  Filled 2013-06-13: qty 1

## 2013-06-13 MED ORDER — PROMETHAZINE HCL 25 MG/ML IJ SOLN
25.0000 mg | Freq: Once | INTRAMUSCULAR | Status: AC
  Administered 2013-06-13: 25 mg via INTRAVENOUS
  Filled 2013-06-13: qty 1

## 2013-06-13 MED ORDER — FENTANYL CITRATE 0.05 MG/ML IJ SOLN
INTRAMUSCULAR | Status: AC
  Filled 2013-06-13: qty 2

## 2013-06-13 MED ORDER — PREGABALIN 50 MG PO CAPS
50.0000 mg | ORAL_CAPSULE | Freq: Once | ORAL | Status: AC
  Administered 2013-06-13: 50 mg via ORAL

## 2013-06-13 MED ORDER — ETOMIDATE 2 MG/ML IV SOLN
INTRAVENOUS | Status: AC
  Filled 2013-06-13: qty 10

## 2013-06-13 MED ORDER — PROPOFOL 10 MG/ML IV BOLUS
INTRAVENOUS | Status: AC
  Filled 2013-06-13: qty 20

## 2013-06-13 MED ORDER — KETOROLAC TROMETHAMINE 30 MG/ML IJ SOLN
30.0000 mg | Freq: Once | INTRAMUSCULAR | Status: AC
  Administered 2013-06-13: 30 mg via INTRAVENOUS

## 2013-06-13 MED ORDER — ONDANSETRON HCL 4 MG/2ML IJ SOLN: 4.0000 mg | Freq: Once | INTRAMUSCULAR | Status: DC | PRN

## 2013-06-13 MED ORDER — METHOCARBAMOL 100 MG/ML IJ SOLN
500.0000 mg | Freq: Once | INTRAVENOUS | Status: AC
  Administered 2013-06-13: 500 mg via INTRAVENOUS
  Filled 2013-06-13: qty 5

## 2013-06-13 MED ORDER — CHLORHEXIDINE GLUCONATE 4 % EX LIQD: 60.0000 mL | Freq: Once | CUTANEOUS | Status: DC

## 2013-06-13 MED ORDER — ALPRAZOLAM 1 MG PO TABS: 0.5000 mg | ORAL_TABLET | Freq: Every evening | ORAL | Status: DC | PRN

## 2013-06-13 MED ORDER — CLOPIDOGREL BISULFATE 75 MG PO TABS: ORAL_TABLET | ORAL | Status: DC

## 2013-06-13 MED ORDER — FENTANYL CITRATE 0.05 MG/ML IJ SOLN
25.0000 ug | INTRAMUSCULAR | Status: DC | PRN
  Administered 2013-06-13 (×4): 50 ug via INTRAVENOUS
  Filled 2013-06-13: qty 2

## 2013-06-13 MED ORDER — NEOSTIGMINE METHYLSULFATE 1 MG/ML IJ SOLN
INTRAMUSCULAR | Status: DC | PRN
  Administered 2013-06-13: 3.5 mg via INTRAVENOUS

## 2013-06-13 MED ORDER — LIDOCAINE HCL 1 % IJ SOLN
INTRAMUSCULAR | Status: DC | PRN
  Administered 2013-06-13: 50 mg via INTRADERMAL

## 2013-06-13 MED ORDER — FENTANYL CITRATE 0.05 MG/ML IJ SOLN
25.0000 ug | INTRAMUSCULAR | Status: AC
  Administered 2013-06-13: 25 ug via INTRAVENOUS
  Administered 2013-06-13: 07:00:00 via INTRAVENOUS

## 2013-06-13 MED ORDER — SODIUM CHLORIDE 0.9 % IV SOLN
Freq: Once | INTRAVENOUS | Status: AC
  Administered 2013-06-13: 17:00:00 via INTRAVENOUS

## 2013-06-13 MED ORDER — OXYCODONE HCL 5 MG PO TABS
ORAL_TABLET | ORAL | Status: AC
  Filled 2013-06-13: qty 1

## 2013-06-13 MED ORDER — KETOROLAC TROMETHAMINE 30 MG/ML IJ SOLN
INTRAMUSCULAR | Status: AC
  Filled 2013-06-13: qty 1

## 2013-06-13 MED ORDER — SCOPOLAMINE 1 MG/3DAYS TD PT72
MEDICATED_PATCH | TRANSDERMAL | Status: AC
  Filled 2013-06-13: qty 1

## 2013-06-13 MED ORDER — SODIUM CHLORIDE 0.9 % IR SOLN
Status: DC | PRN
  Administered 2013-06-13: 1000 mL

## 2013-06-13 SURGICAL SUPPLY — 87 items
ANCHOR SUT 5.5 SPEEDSCREW (Screw) ×3 IMPLANT
ANCHOR SUT CORKSCREW 5X15 (Anchor) ×2 IMPLANT
BAG HAMPER (MISCELLANEOUS) ×2 IMPLANT
BIT DRILL 2.0MX128MM (BIT) ×1 IMPLANT
BLADE HEX COATED 2.75 (ELECTRODE) ×2 IMPLANT
BLADE OSC/SAGITTAL MD 9X18.5 (BLADE) ×2 IMPLANT
BLADE SURG 15 STRL LF DISP TIS (BLADE) ×1 IMPLANT
BLADE SURG 15 STRL SS (BLADE) ×2
BLADE SURG SZ10 CARB STEEL (BLADE) ×2 IMPLANT
BNDG COHESIVE 4X5 TAN STRL (GAUZE/BANDAGES/DRESSINGS) ×2 IMPLANT
BUR FAST CUTTING (BURR)
BUR ROUND 5.0 (BURR) IMPLANT
BUR SRG 54X4.7X12 FLUT (BURR) IMPLANT
BURR SRG 54X4.7X12 FLUT (BURR)
CATH KIT ON Q 2.5IN SLV (PAIN MANAGEMENT) IMPLANT
CHLORAPREP W/TINT 26ML (MISCELLANEOUS) ×2 IMPLANT
CLOTH BEACON ORANGE TIMEOUT ST (SAFETY) ×2 IMPLANT
COOLER CRYO CUFF IC AND MOTOR (MISCELLANEOUS) ×2 IMPLANT
COVER LIGHT HANDLE STERIS (MISCELLANEOUS) ×6 IMPLANT
COVER MAYO STAND XLG (DRAPE) ×2 IMPLANT
COVER PROBE W GEL 5X96 (DRAPES) ×2 IMPLANT
CUFF CRYO UNI SHDR 32X48 (MISCELLANEOUS) ×2 IMPLANT
DECANTER SPIKE VIAL GLASS SM (MISCELLANEOUS) ×4 IMPLANT
DRAPE ORTHO 2.5IN SPLIT 77X108 (DRAPES) ×2 IMPLANT
DRAPE ORTHO SPLIT 77X108 STRL (DRAPES) ×4
DRAPE PROXIMA HALF (DRAPES) ×2 IMPLANT
DRAPE U-SHAPE 47X51 STRL (DRAPES) ×2 IMPLANT
DRESSING ALLEVYN BORDER 5X5 (GAUZE/BANDAGES/DRESSINGS) ×2 IMPLANT
DRESSING ALLEVYN BORDER HEEL (GAUZE/BANDAGES/DRESSINGS) ×2 IMPLANT
ELECT REM PT RETURN 9FT ADLT (ELECTROSURGICAL) ×2
ELECTRODE REM PT RTRN 9FT ADLT (ELECTROSURGICAL) ×1 IMPLANT
FORMALIN 10 PREFIL 120ML (MISCELLANEOUS) ×2 IMPLANT
GLOVE BIOGEL PI IND STRL 7.0 (GLOVE) IMPLANT
GLOVE BIOGEL PI INDICATOR 7.0 (GLOVE) ×2
GLOVE ECLIPSE 7.0 STRL STRAW (GLOVE) ×1 IMPLANT
GLOVE EXAM NITRILE PF MED BLUE (GLOVE) ×1 IMPLANT
GLOVE SKINSENSE NS SZ8.0 LF (GLOVE) ×1
GLOVE SKINSENSE STRL SZ8.0 LF (GLOVE) ×1 IMPLANT
GLOVE SS BIOGEL STRL SZ 6.5 (GLOVE) IMPLANT
GLOVE SS N UNI LF 8.5 STRL (GLOVE) ×2 IMPLANT
GLOVE SUPERSENSE BIOGEL SZ 6.5 (GLOVE) ×1
GOWN STRL REUS W/TWL LRG LVL3 (GOWN DISPOSABLE) ×8 IMPLANT
GOWN STRL REUS W/TWL XL LVL3 (GOWN DISPOSABLE) ×2 IMPLANT
INST SET MINOR BONE (KITS) ×2 IMPLANT
KIT BLADEGUARD II DBL (SET/KITS/TRAYS/PACK) ×2 IMPLANT
KIT ROOM TURNOVER APOR (KITS) ×2 IMPLANT
KIT SURGICAL DEVON (SET/KITS/TRAYS/PACK) ×2 IMPLANT
MANIFOLD NEPTUNE II (INSTRUMENTS) ×2 IMPLANT
MARKER SKIN DUAL TIP RULER LAB (MISCELLANEOUS) ×2 IMPLANT
NDL HYPO 21X1.5 SAFETY (NEEDLE) ×1 IMPLANT
NDL MA TROC 1/2 (NEEDLE) IMPLANT
NDL MAYO 6 CRC TAPER PT (NEEDLE) IMPLANT
NEEDLE HYPO 21X1.5 SAFETY (NEEDLE) ×2 IMPLANT
NEEDLE MA TROC 1/2 (NEEDLE) IMPLANT
NEEDLE MAYO 6 CRC TAPER PT (NEEDLE) ×2 IMPLANT
NS IRRIG 1000ML POUR BTL (IV SOLUTION) ×2 IMPLANT
PACK BASIC III (CUSTOM PROCEDURE TRAY) ×2
PACK SRG BSC III STRL LF ECLPS (CUSTOM PROCEDURE TRAY) ×1 IMPLANT
PAD ARMBOARD 7.5X6 YLW CONV (MISCELLANEOUS) ×2 IMPLANT
PASSER SUT CAPTURE FIRST (SUTURE) ×1 IMPLANT
PENCIL HANDSWITCHING (ELECTRODE) ×2 IMPLANT
RASP LG TEAR CROSS CUT (RASP) IMPLANT
RASP SM TEAR CROSS CUT (RASP) IMPLANT
SET BASIN LINEN APH (SET/KITS/TRAYS/PACK) ×2 IMPLANT
SLING ARM FOAM STRAP XLG (SOFTGOODS) IMPLANT
SLING ARM LRG ADULT FOAM STRAP (SOFTGOODS) ×1 IMPLANT
SLING ARM MED ADULT FOAM STRAP (SOFTGOODS) IMPLANT
SPONGE LAP 18X18 X RAY DECT (DISPOSABLE) ×3 IMPLANT
STAPLER VISISTAT 35W (STAPLE) ×1 IMPLANT
STOCKINETTE IMPERVIOUS LG (DRAPES) ×2 IMPLANT
STRIP CLOSURE SKIN 1/2X4 (GAUZE/BANDAGES/DRESSINGS) IMPLANT
SUT BONE WAX W31G (SUTURE) ×1 IMPLANT
SUT BRALON NAB BRD #1 30IN (SUTURE) IMPLANT
SUT ETHIBOND 5 LR DA (SUTURE) IMPLANT
SUT ETHIBOND NAB OS 4 #2 30IN (SUTURE) ×3 IMPLANT
SUT ETHILON 3 0 FSL (SUTURE) IMPLANT
SUT MON AB 0 CT1 (SUTURE) ×3 IMPLANT
SUT MON AB 2-0 CT1 36 (SUTURE) ×2 IMPLANT
SUT PERFECT PASSER SMRTSTITCH (MISCELLANEOUS) ×1 IMPLANT
SUT PERFECTPASSER WHITE CART (SUTURE) ×2 IMPLANT
SUT PROLENE 3 0 PS 1 (SUTURE) IMPLANT
SUT SMART STITCH CARTRIDGE (SUTURE) ×2 IMPLANT
SUT VIC AB 1 CT1 27 (SUTURE) ×2
SUT VIC AB 1 CT1 27XBRD ANTBC (SUTURE) ×1 IMPLANT
SYR 30ML LL (SYRINGE) ×2 IMPLANT
SYR BULB IRRIGATION 50ML (SYRINGE) ×4 IMPLANT
YANKAUER SUCT 12FT TUBE ARGYLE (SUCTIONS) ×2 IMPLANT

## 2013-06-13 NOTE — Progress Notes (Signed)
Dr Aline Brochure in to talk with pt. Cleared for d/c home.

## 2013-06-13 NOTE — Anesthesia Postprocedure Evaluation (Signed)
  Anesthesia Post-op Note  Patient: Connor Sanford  Procedure(s) Performed: Procedure(s): ROTATOR CUFF REPAIR SHOULDER OPEN (Left) RESECTION DISTAL CLAVICAL (Left)  Patient Location: PACU  Anesthesia Type:General  Level of Consciousness: awake, alert  and oriented  Airway and Oxygen Therapy: Patient Spontanous Breathing and Patient connected to face mask oxygen  Post-op Pain: mild  Post-op Assessment: Post-op Vital signs reviewed, Patient's Cardiovascular Status Stable, Respiratory Function Stable, Patent Airway and No signs of Nausea or vomiting  Post-op Vital Signs: Reviewed and stable  Last Vitals:  Filed Vitals:   06/13/13 0720  BP: 123/85  Pulse:   Temp:   Resp: 20    Complications: No apparent anesthesia complications

## 2013-06-13 NOTE — Op Note (Signed)
06/13/2013  10:19 AM  PATIENT:  Connor Sanford  58 y.o. male  PRE-OPERATIVE DIAGNOSIS:  Left Rotator Cuff Tear,acromioclavicular joint arthritis,  POST-OPERATIVE DIAGNOSIS:  Left massive Rotator Cuff Tear, acromioclavicular arthritis , biceps tendon subluxation  Operative findings Large inferior spur distal clavicle Large inferior spur anterior acromion Massive rotator cuff tear with acute tear of the superior edge of the subscapularis  Biceps tendon subluxation Supraspinatus and infraspinatus chronic tear Teres minor intact  poor tendon quality of the supraspinatus and infraspinatus with massive retraction just medial to the glenoid Significant scarring and stiffness of the shoulder with intraoperative range of motion under anesthesia abduction 75 flexion 90 external rotation 15  Implants 3 ArthroCare speed screws  2 Arthrex corkscrew anchors  PROCEDURE:  Procedure(s): ROTATOR CUFF REPAIR SHOULDER OPEN (Left) RESECTION DISTAL CLAVICAL (Left)  SURGEON:  Surgeon(s) and Role:    * [name], MD - Primary  PHYSICIAN ASSISTANT:   ASSISTANTS: katherine page    ANESTHESIA:   general  EBL:  Total I/O In: 1750 [I.V.:1750] Out: 100 [Blood:100]  BLOOD ADMINISTERED:none  DRAINS: none  LOCAL MEDICATIONS USED:  MARCAINE   , Amount: 60 ml and OTHER epi  SPECIMEN:  Distal clavicle   DISPOSITION OF SPECIMEN:  PATHOLOGY  COUNTS:  YES  TOURNIQUET:  * No tourniquets in log *  DICTATION: .dragon  PLAN OF CARE: TBD  PATIENT DISPOSITION:  PACU - hemodynamically stable.   Delay start of Pharmacological VTE agent (>24hrs) due to surgical blood loss or risk of bleeding: not applicable  Details of procedure The patient was identified in the preoperative holding area and the surgical site was confirmed as left shoulder marked. Chart update was completed patient was taken to the operating room for general anesthesia and appropriate antibiotics based on his weight of  220 pounds. He was placed in the beachchair position. After sterile prep and drape of the left upper extremity surgery was done in the following manner:  An anterior incision was made over the distal clavicle and extended across the anterior deltoid. Subcutaneous tissue was divided down to the trapezial deltoid fascia. Following the skin incision the deltotrapezial fascia was incised and medial and lateral flaps were created. An immediate blush of fluid was noted once we got under the deltoid. We then evaluated the distal clavicle and removed 78 mm of distal clavicle. We did an anterior acromioplasty. We used a rasp to contour the inferior and anterior edges of the distal clavicle and the acromion.  A large bursal accumulation was noted covering the entire humeral head which was actually bear. The tissue of the supraspinatus and the infraspinatus was of poor quality and the tendons were retracted past the glenoid. The biceps tendon was subluxated and had degeneration in the extra-articular portion. The subscapularis superior half was retracted and torn and appeared to be more acute.  Debridement of the supraspinatus and infraspinatus was performed in several sutures were placed in the superior and inferior edges were released. This did not give adequate excursion of the tendon therefore the supraspinatus was released from the coracoid. At this point advancement of the edge of the tendon was noted but not to the level of the tuberosity. Further release was performed using an interval slide between the supraspinatus and infraspinatus which gave excursion to the level of the articular margin.  We first repaired the infraspinatus using a speed screw anchor and then repaired the supraspinatus using 2 speed screw anchors. This was repaired with the arm and  20 abduction and after the repair the arm was brought down to the side and the tendon repair appeared to be stable. After thorough irrigation we then turned  our attention to the biceps tendon and the subscapularis. A corkscrew anchor was placed at the lesser tuberosity and 2 sutures from the anchor were placed to repair the subscapularis. A fascial sling was created to repair the biceps tendon back to its intertubercular groove  Range of motion of the shoulder allowed 20 of external rotation 70 of abduction and 70 of forward elevation. We will use these parameters to start range of motion exercises at 6 weeks.  The wounds were irrigated and closed with 0 Monocryl for the trapezial deltoid fascial layer; 0 Monocryl for the subcutaneous layer and skin staples were placed for the skin approximation. Subdeltoid injection was performed with 60 cc of Marcaine.  The patient was placed in a sling extubated and taken to recovery room in stable condition   [id number]

## 2013-06-13 NOTE — Addendum Note (Signed)
Addendum created 06/13/13 1040 by Ollen Bowl, CRNA   Modules edited: Charges VN

## 2013-06-13 NOTE — Telephone Encounter (Signed)
Patient called, states has just been discharged to home from out-patient surgery for left shoulder today at [facility] he is unable to keep anything down, is nauseated, and therefore, has been unable to eat anything.  His wife also came onto phone and states same, and that she assumes it is related to the anesthesia?   Patient is asking if something should be prescribed?  His pharmacy is [facility].  I relayed that Dr Aline Brochure is not in office, and nurse has left for the day as well.  He will also try reviewing his hospital instructions.  His post op 1 appointment is Monday, 06/18/13.   His ph#'s are [phone] [facility]) / [phone] [facility])

## 2013-06-13 NOTE — ED Provider Notes (Signed)
CSN: [id number]     Arrival date & time 06/13/13  1649 History  This chart was scribed for Tanna Furry, MD by Rolanda Lundborg, ED Scribe. This patient was seen in room APA07/APA07 and the patient's care was started at 5:15 PM.    Chief Complaint  Patient presents with  . Post-op Problem   The history is provided by the patient. No language interpreter was used.   HPI Comments: Connor Sanford is a 58 y.o. male who presents to the Emergency Department complaining of waxing and waning nausea since 12:30pm today after having surgery on his rotator cuff this morning here by Dr Aline Brochure. He states the nausea worsens when he tries to eat. States he has been unable to keep down Ginger ale, Sprite, or crackers. He has not been able to keep down his pain medicine and he is having pain in his shoulder. He states his nausea is mostly resolved since having Zofran here but he is worried it will come back when he tries to eat or drink. He denies abdominal pain or any other symptoms. He denies h/o ulcers or any stomach problems. He has no allergies to medications.    Past Medical History  Diagnosis Date  . Coronary artery disease   . MI (myocardial infarction) 2010  . H/O seasonal allergies   . Hyperlipidemia   . Hypertension    Past Surgical History  Procedure Laterality Date  . Colonoscopy N/A 12/27/2012    Procedure: COLONOSCOPY;  Surgeon: Rogene Houston, MD;  Location: [facility];  Service: Endoscopy;  Laterality: N/A;  730  . Cardiovascular stress test  04/16/2009    Mild perfusion defect seen in Mid Anterior, Apical Anterior, and Apical regions - consistent with infarct/scar. No scintigraphic evidence of inducible myocardial ischemia. EKG negative for ischemia. No ECG changes.  . Transthoracic echocardiogram  04/16/2009    EF >55%, no significant valvular abnormalities.  . Cardiac catheterization  01/27/2009    Occluded second diagonal branch-stented with a 2.25x70m Integrity Medtronic  bare-metal stent at 12 atm (2.38-mm) resulting in reduction of 100% stenosis to 0% residual. Small fairly focal stenosis just distalof stented segment-stented with a 2.25x877mIntegrity Medtronic bare-metal stent at 12 atm, reduced to 0% residual with TIMI3 flow.  . Coronary angioplasty      2 stents   Family History  Problem Relation Age of Onset  . Colon cancer Neg Hx    History  Substance Use Topics  . Smoking status: Never Smoker   . Smokeless tobacco: Not on file  . Alcohol Use: No    Review of Systems  Constitutional: Negative for fever, chills, diaphoresis, appetite change and fatigue.  HENT: Negative for mouth sores, sore throat and trouble swallowing.   Eyes: Negative for visual disturbance.  Respiratory: Negative for cough, chest tightness, shortness of breath and wheezing.   Cardiovascular: Negative for chest pain.  Gastrointestinal: Positive for nausea. Negative for vomiting, abdominal pain, diarrhea and abdominal distention.  Endocrine: Negative for polydipsia, polyphagia and polyuria.  Genitourinary: Negative for dysuria, frequency and hematuria.  Musculoskeletal: Negative for gait problem.  Skin: Negative for color change, pallor and rash.  Neurological: Negative for dizziness, syncope, light-headedness and headaches.  Hematological: Does not bruise/bleed easily.  Psychiatric/Behavioral: Negative for behavioral problems and confusion.      Allergies  Review of patient's allergies indicates no known allergies.  Home Medications   Prior to Admission medications   Medication Sig Start Date End Date Taking? Authorizing Provider  ALPRAZolam (XANAX) 1 MG tablet Take 0.5 tablets (0.5 mg total) by mouth at bedtime as needed for sleep. 06/13/13   [name], MD  aspirin EC 81 MG tablet Take 81 mg by mouth daily.      Historical Provider, MD  atorvastatin (LIPITOR) 40 MG tablet Take 1 tablet (40 mg total) by mouth daily. 08/18/12   [name], MD  cetirizine  (ZYRTEC) 10 MG tablet Take 10 mg by mouth daily as needed for allergies.     Historical Provider, MD  clopidogrel (PLAVIX) 75 MG tablet Take 1 tablet by mouth daily. 06/13/13   [name], MD  diphenhydramine-acetaminophen (TYLENOL PM) 25-500 MG TABS Take 1 tablet by mouth at bedtime as needed (for sleep).    Historical Provider, MD  Menthol-Methyl Salicylate (MUSCLE RUB) 10-15 % CREA Apply 1 application topically as needed for muscle pain.    Historical Provider, MD  methocarbamol (ROBAXIN) 500 MG tablet Take 1 tablet (500 mg total) by mouth 4 (four) times daily. 06/13/13   [name], MD  metoprolol tartrate (LOPRESSOR) 25 MG tablet TAKE ONE-HALF TABLET BY MOUTH TWICE A DAY 03/29/13   [name], MD  Multiple Vitamins-Minerals (MULTIVITAMIN WITH IRON-MINERALS) liquid Take 30 mLs by mouth daily.      Historical Provider, MD  oxyCODONE-acetaminophen (ROXICET) 5-325 MG per tablet Take 1 tablet by mouth every 4 (four) hours as needed for severe pain. 06/13/13   [name], MD   BP 135/83  Pulse 65  Temp(Src) 97.8 F (36.6 C) (Oral)  Resp 18  Ht 5' 10"  (1.778 m)  Wt 219 lb (99.338 kg)  BMI 31.42 kg/m2  SpO2 100% Physical Exam  Constitutional: He is oriented to person, place, and time. He appears well-developed and well-nourished. No distress.  HENT:  Head: Normocephalic.  Dry mouth  Eyes: Conjunctivae are normal. Pupils are equal, round, and reactive to light. No scleral icterus.  Neck: Normal range of motion. Neck supple. No thyromegaly present.  Cardiovascular: Normal rate and regular rhythm.  Exam reveals no gallop and no friction rub.   No murmur heard. Pulmonary/Chest: Effort normal and breath sounds normal. No respiratory distress. He has no wheezes. He has no rales.  Abdominal: Soft. Bowel sounds are normal. He exhibits no distension. There is no tenderness. There is no rebound.  Musculoskeletal: Normal range of motion.  Left upper extremity sling   Neurological: He is alert and oriented to person, place, and time.  Skin: Skin is warm and dry. No rash noted.  Psychiatric: He has a normal mood and affect. His behavior is normal.    ED Course  Procedures (including critical care time) Medications  scopolamine (TRANSDERM-SCOP) 1 MG/3DAYS 1.5 mg (1.5 mg Transdermal Patch Applied 06/13/13 1928)  fentaNYL (SUBLIMAZE) injection 75 mcg (not administered)  ondansetron (ZOFRAN) 8 mg/NS 50 ml IVPB (8 mg Intravenous Not Given 06/13/13 2203)  ondansetron (ZOFRAN-ODT) disintegrating tablet 8 mg (8 mg Oral Given 06/13/13 1658)  0.9 %  sodium chloride infusion ( Intravenous New Bag/Given 06/13/13 1729)  ondansetron (ZOFRAN) injection 4 mg (4 mg Intravenous Given 06/13/13 1726)  morphine 4 MG/ML injection 4 mg (4 mg Intravenous Given 06/13/13 1844)  oxyCODONE-acetaminophen (PERCOCET/ROXICET) 5-325 MG per tablet 1 tablet (1 tablet Oral Given 06/13/13 1928)  promethazine (PHENERGAN) injection 25 mg (25 mg Intravenous Given 06/13/13 2028)  ondansetron (ZOFRAN) 4 MG/2ML injection (8 mg  Given 06/13/13 2159)    DIAGNOSTIC STUDIES: Oxygen Saturation is 100% on RA, normal  by my interpretation.    COORDINATION OF CARE: 6:26 PM- Discussed treatment plan with pt which includes IV pain meds and PO trial. Pt agrees to plan.    Labs Review Labs Reviewed - No data to display  Imaging Review No results found.   EKG Interpretation None      MDM   Final diagnoses:  Postoperative vomiting    Patient remained fairly nauseated despite IV Phenergan and Zofran. Scopolamine patch was placed. He been given morphine by myself on his arrival. This seemed to make it somewhat worse. His nausea is improving. Given some IV fentanyl which he tolerated much better. Currently his symptoms are minimal with pain. Nausea is resolved. Discharged home. Zofran prescription. Date Zofran prior to food and food prior to pain medications.  I personally performed the services  described in this documentation, which was scribed in my presence. The recorded information has been reviewed and is accurate.   Tanna Furry, MD 06/13/13 [phone]

## 2013-06-13 NOTE — Brief Op Note (Addendum)
06/13/2013  10:19 AM  PATIENT:  Connor Sanford  58 y.o. male  PRE-OPERATIVE DIAGNOSIS:  Left Rotator Cuff Tear,acromioclavicular joint arthritis,  POST-OPERATIVE DIAGNOSIS:  Left massive Rotator Cuff Tear, acromioclavicular arthritis , biceps tendon subluxation  PROCEDURE:  Procedure(s): ROTATOR CUFF REPAIR SHOULDER OPEN (Left) RESECTION DISTAL CLAVICAL (Left)  SURGEON:  Surgeon(s) and Role:    * [name], MD - Primary  PHYSICIAN ASSISTANT:   ASSISTANTS: katherine page    ANESTHESIA:   general  EBL:  Total I/O In: 1750 [I.V.:1750] Out: 100 [Blood:100]  BLOOD ADMINISTERED:none  DRAINS: none  LOCAL MEDICATIONS USED:  MARCAINE   , Amount: 60 ml and OTHER epi  SPECIMEN:  Distal clavicle   DISPOSITION OF SPECIMEN:  PATHOLOGY  COUNTS:  YES  TOURNIQUET:  * No tourniquets in log *  DICTATION: .dragon  PLAN OF CARE: TBD  PATIENT DISPOSITION:  PACU - hemodynamically stable.   Delay start of Pharmacological VTE agent (>24hrs) due to surgical blood loss or risk of bleeding: not applicable

## 2013-06-13 NOTE — ED Notes (Signed)
Pt reporting some improvement in nausea.  Requesting crackers and something to drink.

## 2013-06-13 NOTE — Discharge Instructions (Signed)
Sleep in recliner  Keep arm in sling   Keep arm next to body  Incision Care An incision is when a surgeon cuts into your body tissues. After surgery, the incision needs to be cared for properly to prevent infection.  HOME CARE INSTRUCTIONS   Take all medicine as directed by your caregiver. Only take over-the-counter or prescription medicines for pain, discomfort, or fever as directed by your caregiver.  Do not remove your bandage (dressing) or get your incision wet until your surgeon gives you permission. In the event that your dressing becomes wet, dirty, or starts to smell, change the dressing and call your surgeon for instructions as soon as possible.  Take showers. Do not take tub baths, swim, or do anything that may soak the wound until it is healed.  Resume your normal diet and activities as directed or allowed.  Avoid lifting any weight until you are instructed otherwise.  Use anti-itch antihistamine medicine as directed by your caregiver. The wound may itch when it is healing. Do not pick or scratch at the wound.  Follow up with your caregiver for stitch (suture) or staple removal as directed.  Drink enough fluids to keep your urine clear or pale yellow. SEEK MEDICAL CARE IF:   You have redness, swelling, or increasing pain in the wound that is not controlled with medicine.  You have drainage, blood, or pus coming from the wound that lasts longer than 1 day.  You develop muscle aches, chills, or a general ill feeling.  You notice a bad smell coming from the wound or dressing.  Your wound edges separate after the sutures, staples, or skin adhesive strips have been removed.  You develop persistent nausea or vomiting. SEEK IMMEDIATE MEDICAL CARE IF:   You have a fever.  You develop a rash.  You develop dizzy episodes or faint while standing.  You have difficulty breathing.  You develop any reaction or side effects to medicine given. MAKE SURE YOU:   Understand  these instructions.  Will watch your condition.  Will get help right away if you are not doing well or get worse. Document Released: 09/04/2004 Document Revised: 05/10/2011 Document Reviewed: 06/21/2010 [facility] Patient Information 2014 [place], [place].

## 2013-06-13 NOTE — Discharge Instructions (Signed)
Take Zofran before you take any pain medications. Take the medications with food.  Nausea, Adult Nausea means you feel sick to your stomach or need to throw up (vomit). It may be a sign of a more serious problem. If nausea gets worse, you may throw up. If you throw up a lot, you may lose too much body fluid (dehydration). HOME CARE   Get plenty of rest.  Ask your doctor how to replace body fluid losses (rehydrate).  Eat small amounts of food. Sip liquids more often.  Take all medicines as told by your doctor. GET HELP RIGHT AWAY IF:  You have a fever.  You pass out (faint).  You keep throwing up or have blood in your throw up.  You are very weak, have dry lips or a dry mouth, or you are very thirsty (dehydrated).  You have dark or bloody poop (stool).  You have very bad chest or belly (abdominal) pain.  You do not get better after 2 days, or you get worse.  You have a headache. MAKE SURE YOU:  Understand these instructions.  Will watch your condition.  Will get help right away if you are not doing well or get worse. Document Released: 02/04/2011 Document Revised: 05/10/2011 Document Reviewed: 02/04/2011 [facility] Patient Information 2014 [place], [place].

## 2013-06-13 NOTE — Interval H&P Note (Signed)
History and Physical Interval Note:  06/13/2013 7:15 AM  Connor Sanford  has presented today for surgery, with the diagnosis of Left Rotator Cuff Tear  The various methods of treatment have been discussed with the patient and family. After consideration of risks, benefits and other options for treatment, the patient has consented to  Procedure(s): ROTATOR CUFF REPAIR SHOULDER OPEN (Left) RESECTION DISTAL CLAVICAL (Left) as a surgical intervention .  The patient's history has been reviewed, patient examined, no change in status, stable for surgery.  I have reviewed the patient's chart and labs.  Questions were answered to the patient's satisfaction.     [name]

## 2013-06-13 NOTE — Anesthesia Procedure Notes (Signed)
Procedure Name: Intubation Date/Time: 06/13/2013 7:40 AM Performed by: Tressie Stalker E Pre-anesthesia Checklist: Patient identified, Patient being monitored, Timeout performed, Emergency Drugs available and Suction available Patient Re-evaluated:Patient Re-evaluated prior to inductionOxygen Delivery Method: Circle System Utilized Preoxygenation: Pre-oxygenation with 100% oxygen Intubation Type: IV induction Ventilation: Mask ventilation without difficulty Laryngoscope Size: Mac and 3 Grade View: Grade I Tube type: Oral Tube size: 8.0 mm Number of attempts: 1 Airway Equipment and Method: stylet Placement Confirmation: ETT inserted through vocal cords under direct vision,  positive ETCO2 and breath sounds checked- equal and bilateral Secured at: 22 cm Tube secured with: Tape Dental Injury: Teeth and Oropharynx as per pre-operative assessment

## 2013-06-13 NOTE — Transfer of Care (Signed)
Immediate Anesthesia Transfer of Care Note  Patient: Connor Sanford  Procedure(s) Performed: Procedure(s): ROTATOR CUFF REPAIR SHOULDER OPEN (Left) RESECTION DISTAL CLAVICAL (Left)  Patient Location: PACU  Anesthesia Type:General  Level of Consciousness: awake and alert   Airway & Oxygen Therapy: Patient Spontanous Breathing  Post-op Assessment: Report given to PACU RN  Post vital signs: Reviewed and stable  Complications: No apparent anesthesia complications

## 2013-06-13 NOTE — ED Notes (Signed)
Pt had surgery on rotator cuff this morning, uncontrolled nausea, pt states he cant take his pain med or keep anything down.

## 2013-06-13 NOTE — Anesthesia Preprocedure Evaluation (Signed)
Anesthesia Evaluation  Patient identified by MRN, date of birth, ID band Patient awake    Reviewed: Allergy & Precautions, H&P , NPO status , Patient's Chart, lab work & pertinent test results, reviewed documented beta blocker date and time   Airway Mallampati: II TM Distance: >3 FB Neck ROM: Full    Dental  (+) Teeth Intact   Pulmonary neg pulmonary ROS,  breath sounds clear to auscultation        Cardiovascular hypertension, Pt. on medications and Pt. on home beta blockers - angina+ CAD, + Past MI and + Cardiac Stents Rhythm:Regular Rate:Normal     Neuro/Psych    GI/Hepatic negative GI ROS,   Endo/Other    Renal/GU      Musculoskeletal   Abdominal   Peds  Hematology   Anesthesia Other Findings   Reproductive/Obstetrics                           Anesthesia Physical Anesthesia Plan  ASA: III  Anesthesia Plan: General   Post-op Pain Management:    Induction: Intravenous  Airway Management Planned: Oral ETT  Additional Equipment:   Intra-op Plan:   Post-operative Plan: Extubation in OR  Informed Consent: I have reviewed the patients History and Physical, chart, labs and discussed the procedure including the risks, benefits and alternatives for the proposed anesthesia with the patient or authorized representative who has indicated his/her understanding and acceptance.     Plan Discussed with:   Anesthesia Plan Comments:         Anesthesia Quick Evaluation

## 2013-06-13 NOTE — ED Notes (Signed)
Pt reporting continued discomfort in shoulder following surgery today.  No nausea noted at this time.  Pt medicated.  No distress noted at this time.

## 2013-06-14 NOTE — Telephone Encounter (Signed)
You can call me if this happens to a patient again

## 2013-06-15 ENCOUNTER — Encounter (HOSPITAL_COMMUNITY): Payer: Self-pay | Admitting: Orthopedic Surgery

## 2013-06-15 NOTE — Telephone Encounter (Signed)
Noted, 06/14/13 .  As of 06/14/13, Tammy Long, LPN, called patient to follow up.  Patient relayed that he elected to go back to [facility], Emergency Department, and was treated and released to home with medication.  Patient's post op#1 appointment is Monday, 06/18/13.

## 2013-06-18 ENCOUNTER — Encounter: Payer: Self-pay | Admitting: Orthopedic Surgery

## 2013-06-18 ENCOUNTER — Ambulatory Visit (INDEPENDENT_AMBULATORY_CARE_PROVIDER_SITE_OTHER): Payer: Worker's Compensation | Admitting: Orthopedic Surgery

## 2013-06-18 VITALS — BP 154/90 | Ht 70.0 in | Wt 219.0 lb

## 2013-06-18 DIAGNOSIS — Z9889 Other specified postprocedural states: Secondary | ICD-10-CM | POA: Insufficient documentation

## 2013-06-18 DIAGNOSIS — M75122 Complete rotator cuff tear or rupture of left shoulder, not specified as traumatic: Secondary | ICD-10-CM

## 2013-06-18 DIAGNOSIS — M75120 Complete rotator cuff tear or rupture of unspecified shoulder, not specified as traumatic: Secondary | ICD-10-CM

## 2013-06-18 DIAGNOSIS — M19019 Primary osteoarthritis, unspecified shoulder: Secondary | ICD-10-CM | POA: Insufficient documentation

## 2013-06-18 MED ORDER — OXYCODONE-ACETAMINOPHEN 10-325 MG PO TABS
1.0000 | ORAL_TABLET | ORAL | Status: DC | PRN
Start: 2013-06-18 — End: 2013-06-26

## 2013-06-18 MED ORDER — OXYCODONE-ACETAMINOPHEN 5-325 MG PO TABS: 1.0000 | ORAL_TABLET | ORAL | Status: DC | PRN

## 2013-06-18 NOTE — Progress Notes (Signed)
Patient ID: Connor Sanford, male   DOB: August 04, 1955, 58 y.o.   MRN: [id number]  Chief Complaint  Patient presents with  . Follow-up    Post op # 1 Left RCR DOS 06/13/13    Postop day #5 The incision is clean dry and intact, mild swelling noted in the hand and forearm  No neurovascular deficits  The patient is placed in a sling shot with a pillow  This was a massive cuff tear. It appeared to be a chronically torn supraspinatus infraspinatus with a subscapularis tear acute with biceps tendon subluxation acute.  I expect 6 weeks in the shoulder immobilizer. Then 6 weeks of passive range of motion left shoulder  At 12 weeks we can start progressive resistance exercises  At 24 weeks we can start more aggressive strengthening exercises  I expect one year out of work before he can return to normal duty, expect maximal medical improvement at a year, possible light duty at 6 months

## 2013-06-18 NOTE — Patient Instructions (Signed)
Wear sling with pillow continuously even while sleeping

## 2013-06-19 ENCOUNTER — Telehealth: Payer: Self-pay | Admitting: Orthopedic Surgery

## 2013-06-19 NOTE — Telephone Encounter (Signed)
Faxed Operatve note + office visit 06/18/13, including work status note to Workers Comp, Key Risk, Attention Wilfred Curtis, nurse case [employer], fax # [phone], ph# [phone].  Patient aware.

## 2013-06-26 ENCOUNTER — Ambulatory Visit (INDEPENDENT_AMBULATORY_CARE_PROVIDER_SITE_OTHER): Payer: Worker's Compensation | Admitting: Orthopedic Surgery

## 2013-06-26 ENCOUNTER — Encounter: Payer: Self-pay | Admitting: Orthopedic Surgery

## 2013-06-26 VITALS — BP 144/94 | Ht 70.0 in | Wt 219.0 lb

## 2013-06-26 DIAGNOSIS — M75120 Complete rotator cuff tear or rupture of unspecified shoulder, not specified as traumatic: Secondary | ICD-10-CM

## 2013-06-26 DIAGNOSIS — Z9889 Other specified postprocedural states: Secondary | ICD-10-CM

## 2013-06-26 DIAGNOSIS — M75122 Complete rotator cuff tear or rupture of left shoulder, not specified as traumatic: Secondary | ICD-10-CM

## 2013-06-26 MED ORDER — OXYCODONE-ACETAMINOPHEN 10-325 MG PO TABS: 1.0000 | ORAL_TABLET | ORAL | Status: DC | PRN

## 2013-06-26 NOTE — Patient Instructions (Signed)
Continue brace. 

## 2013-06-26 NOTE — Progress Notes (Signed)
Patient ID: Connor Sanford, male   DOB: 20-Jan-1956, 58 y.o.   MRN: [id number] Postop  Chief Complaint  Patient presents with  . Follow-up    8 day recheck post op RCR , sutures removed DOS 06/13/13 Workers Comp    Ht 5\' 10"  (1.778 m)  Wt 219 lb (99.338 kg)  BMI 31.42 kg/m2  Status post repair of massive rotator cuff tear including subscapularis tear with retraction  Using an abduction immobilizer. His incision is clean dry and intact his staples were taken out. He remained in his abduction immobilizer for another 4 weeks. When he returns I will initiate air orders for occupational therapy.

## 2013-06-29 ENCOUNTER — Telehealth: Payer: Self-pay | Admitting: Orthopedic Surgery

## 2013-06-29 NOTE — Telephone Encounter (Signed)
Notes (Date of service 06/26/13) including work status note, faxed to Workers Comp, [employer], Attention Wilfred Curtis, nurse case manager, fax # [phone], ph# [phone]. Patient aware.

## 2013-07-03 NOTE — Telephone Encounter (Signed)
Following surgery as scheduled (06/13/13), it came to our attention that, as previously discussed with Workers Comp, that in event that additional or other procedures may be done, due to findings at time of surgery, additional CPT codes:  23420 + 23120 noted, and filed accordingly.

## 2013-07-10 ENCOUNTER — Telehealth: Payer: Self-pay | Admitting: Orthopedic Surgery

## 2013-07-10 NOTE — Telephone Encounter (Signed)
Connor Sanford wants a prescription for Oxycodone 10/325  His # [phone]

## 2013-07-10 NOTE — Telephone Encounter (Signed)
Routing to Dr Harrison 

## 2013-07-11 ENCOUNTER — Other Ambulatory Visit: Payer: Self-pay | Admitting: Orthopedic Surgery

## 2013-07-11 MED ORDER — OXYCODONE-ACETAMINOPHEN 5-325 MG PO TABS: 1.0000 | ORAL_TABLET | ORAL | Status: DC | PRN

## 2013-07-11 NOTE — Telephone Encounter (Signed)
Patient was contacted that prescription was ready for pick up.

## 2013-07-24 ENCOUNTER — Telehealth: Payer: Self-pay | Admitting: Orthopedic Surgery

## 2013-07-24 ENCOUNTER — Other Ambulatory Visit: Payer: Self-pay | Admitting: *Deleted

## 2013-07-24 DIAGNOSIS — M75122 Complete rotator cuff tear or rupture of left shoulder, not specified as traumatic: Secondary | ICD-10-CM

## 2013-07-24 MED ORDER — METHOCARBAMOL 500 MG PO TABS: 500.0000 mg | ORAL_TABLET | Freq: Four times a day (QID) | ORAL | Status: DC

## 2013-07-24 NOTE — Telephone Encounter (Signed)
yes

## 2013-07-24 NOTE — Telephone Encounter (Signed)
Sent in refill per Dr. Aline Brochure to [facility]. Patient aware to continue, and that refill was sent in.

## 2013-07-24 NOTE — Telephone Encounter (Signed)
Connor Sanford asked if you want him to continue taking the muscle relaxer - Methocarbamol 500 mg. He has an appointment this Thursday, but will be out before then.   He uses [facility]

## 2013-07-26 ENCOUNTER — Encounter: Payer: Self-pay | Admitting: Orthopedic Surgery

## 2013-07-26 ENCOUNTER — Ambulatory Visit (INDEPENDENT_AMBULATORY_CARE_PROVIDER_SITE_OTHER): Payer: Worker's Compensation | Admitting: Orthopedic Surgery

## 2013-07-26 ENCOUNTER — Telehealth: Payer: Self-pay | Admitting: Orthopedic Surgery

## 2013-07-26 VITALS — BP 145/83 | Ht 70.0 in | Wt 219.0 lb

## 2013-07-26 DIAGNOSIS — M75122 Complete rotator cuff tear or rupture of left shoulder, not specified as traumatic: Secondary | ICD-10-CM

## 2013-07-26 DIAGNOSIS — M75120 Complete rotator cuff tear or rupture of unspecified shoulder, not specified as traumatic: Secondary | ICD-10-CM

## 2013-07-26 DIAGNOSIS — Z9889 Other specified postprocedural states: Secondary | ICD-10-CM

## 2013-07-26 MED ORDER — ALPRAZOLAM 1 MG PO TABS
1.0000 mg | ORAL_TABLET | Freq: Every evening | ORAL | Status: DC | PRN
Start: 2013-07-26 — End: 2013-07-26

## 2013-07-26 MED ORDER — ALPRAZOLAM 1 MG PO TABS: 1.0000 mg | ORAL_TABLET | Freq: Every evening | ORAL | Status: DC | PRN

## 2013-07-26 NOTE — Telephone Encounter (Signed)
Office notes, including work status note, date of service today, 07/26/13 provided to Workers Comp, Key Risk, Attention Wilfred Curtis, nurse case manager, fax # [phone], ph# [phone], at time of office visit as she had accompanied him to the appointment.

## 2013-07-26 NOTE — Progress Notes (Signed)
Patient ID: Connor Sanford, male   DOB: [birth year], 58 y.o.   MRN: [id number]  Chief Complaint  Patient presents with  . Follow-up    4 week recheck left  RC repair DOS 06/13/13, DOI 04/16/13   Encounter Diagnoses  Name Primary?  . S/P rotator cuff repair Yes  . Complete rotator cuff tear of left shoulder     BP 145/83  Ht 5\' 10"  (1.778 m)  Wt 219 lb (99.338 kg)  BMI 31.42 kg/m2  Mr. Oran tells me his shoulder is feeling better he's taking 1 pain pill in the morning one at night. He is compliant with his shoulder immobilizer. He is having some difficulty sleeping which is relieved by Xanax 1 mg at bedtime. He takes his Robaxin 4 times a day.  He looks good in terms of his wound he has minimal swelling he is moving his elbow wrist and hand normally  At this point we would like to start him on a physical therapy program passive range of motion for the next 6 weeks. Continue shoulder immobilizer during the day he can remove it at nighttime when he is sleeping. We will continue his Percocet every 8 hours as needed although he takes it every 12. The refill of Xanax. He will be out of work until August 28 at which time he can go back light duty    Meds ordered this encounter  Medications  . DISCONTD: ALPRAZolam (XANAX) 1 MG tablet    Sig: Take 1 tablet (1 mg total) by mouth at bedtime as needed for sleep.    Dispense:  30 tablet    Refill:  0  . ALPRAZolam (XANAX) 1 MG tablet    Sig: Take 1 tablet (1 mg total) by mouth at bedtime as needed for sleep.    Dispense:  30 tablet    Refill:  0    Orders Placed This Encounter  Procedures  . Ambulatory referral to Physical Therapy

## 2013-07-26 NOTE — Patient Instructions (Addendum)
Start therapy at facility of choice per W/C  Continue shoulder IMMOB  Sling times 6 weeks OOW til 10/26/13

## 2013-07-30 ENCOUNTER — Telehealth: Payer: Self-pay | Admitting: Orthopedic Surgery

## 2013-07-30 NOTE — Telephone Encounter (Signed)
Physical therapy orders (per office visit 07/26/13) faxed as per request to Workers Comp's 3rd party contact for therapy requests, [employer], Fax [phone], ph# [phone].  Awaiting review, approval, and provider, as welll as appointment, for order to be faxed directly.  Patient aware.

## 2013-07-31 ENCOUNTER — Telehealth: Payer: Self-pay | Admitting: Orthopedic Surgery

## 2013-07-31 NOTE — Telephone Encounter (Signed)
Patient stopped in to request refill on pain medication - oxyCODONE-acetaminophen (ROXICET) 5-325 MG per tablet [[id number]].  He states Dr Aline Brochure had asked him the day of his recent office visit, but he had not needed it at that time, he states.  Please advise.  His cell ph# is [phone] [facility])

## 2013-07-31 NOTE — Telephone Encounter (Signed)
Routing to Dr Harrison 

## 2013-07-31 NOTE — Telephone Encounter (Signed)
Patient's physical therapy approved per Workers Comp's authorization contact, [employer], for provider: [facility], [place], [place], fax [phone] - order faxed, and appointment has been scheduled there, for today, 07/31/13 (at 1:00p.m) - patient aware.

## 2013-08-02 ENCOUNTER — Other Ambulatory Visit: Payer: Self-pay | Admitting: *Deleted

## 2013-08-02 MED ORDER — OXYCODONE-ACETAMINOPHEN 5-325 MG PO TABS: 1.0000 | ORAL_TABLET | ORAL | Status: DC | PRN

## 2013-08-02 NOTE — Telephone Encounter (Signed)
Refilled per Dr. Aline Brochure and patient picked up 08/02/13.

## 2013-08-14 ENCOUNTER — Other Ambulatory Visit: Payer: Self-pay | Admitting: Cardiovascular Disease

## 2013-08-14 NOTE — Telephone Encounter (Signed)
Rx refill sent to patient pharmacy   

## 2013-08-21 ENCOUNTER — Telehealth: Payer: Self-pay | Admitting: *Deleted

## 2013-08-21 NOTE — Telephone Encounter (Signed)
Call received from [facility], PT with [facility] in [place], she stated patient is doing great, and she wants to know if she can start Active Assist ROM?  She is aware that Dr. Aline Brochure is out of the office until 08/27/13. Please advise.

## 2013-08-26 NOTE — Telephone Encounter (Signed)
FOLLOW PROTOCOL  IF THEY DONT HAVE ONE I WILL SEND ONE

## 2013-08-30 NOTE — Telephone Encounter (Signed)
Left message with Dr. Ruthe Mannan reply.

## 2013-09-04 ENCOUNTER — Telehealth: Payer: Self-pay | Admitting: Orthopedic Surgery

## 2013-09-04 ENCOUNTER — Encounter: Payer: Self-pay | Admitting: Orthopedic Surgery

## 2013-09-04 ENCOUNTER — Ambulatory Visit (INDEPENDENT_AMBULATORY_CARE_PROVIDER_SITE_OTHER): Payer: Worker's Compensation | Admitting: Orthopedic Surgery

## 2013-09-04 VITALS — BP 113/98 | Ht 70.0 in | Wt 219.0 lb

## 2013-09-04 DIAGNOSIS — Z9889 Other specified postprocedural states: Secondary | ICD-10-CM

## 2013-09-04 DIAGNOSIS — M75120 Complete rotator cuff tear or rupture of unspecified shoulder, not specified as traumatic: Secondary | ICD-10-CM

## 2013-09-04 DIAGNOSIS — M75122 Complete rotator cuff tear or rupture of left shoulder, not specified as traumatic: Secondary | ICD-10-CM

## 2013-09-04 MED ORDER — METHOCARBAMOL 500 MG PO TABS: 500.0000 mg | ORAL_TABLET | Freq: Four times a day (QID) | ORAL | Status: DC | PRN

## 2013-09-04 MED ORDER — HYDROCODONE-ACETAMINOPHEN 10-325 MG PO TABS: 1.0000 | ORAL_TABLET | ORAL | Status: DC | PRN

## 2013-09-04 NOTE — Telephone Encounter (Signed)
Notes faxed (2 most recent dates of service 07/26/13 and 09/04/13, to Workers Comp, [employer], Attention Wilfred Curtis, nurse case manager, fax # [phone], ph# [phone]. Patient aware.

## 2013-09-04 NOTE — Patient Instructions (Addendum)
Continue therapy New pain med Muscle relaxer as needed only No boating and riding lawn mower only No working until August 28th

## 2013-09-04 NOTE — Progress Notes (Signed)
Patient ID: Connor Sanford, male   DOB: 06-03-1955, 58 y.o.   MRN: [id number]  Chief Complaint  Patient presents with  . Follow-up    5 week follow up post left RCR, DOS4/15/15    58 years old this is approximately 12 weeks post surgery massive rotator cuff tear and repair on 06/13/2013 he is in therapy at hand and rehabilitation progressing well with flexion now up to 100. His pain is well-controlled on Percocet on a when necessary basis is on a muscle relaxer and Xanax for insomnia.  His incision healed nicely he has no swelling. He has a lot of scapular substitution which we will work on the therapy. It is okay for him to ride a riding lawnmower. He will return in 6 weeks he should continue his therapy he continues out of work we anticipate light desk duty onlyduty on August 28

## 2013-09-18 ENCOUNTER — Other Ambulatory Visit: Payer: Self-pay | Admitting: *Deleted

## 2013-09-20 ENCOUNTER — Telehealth (HOSPITAL_COMMUNITY): Payer: Self-pay

## 2013-10-15 ENCOUNTER — Telehealth: Payer: Self-pay | Admitting: *Deleted

## 2013-10-15 NOTE — Telephone Encounter (Signed)
WORKMAN'S COMP NURSE REQUEST COPY OF NOTE FROM PT BE FAXED TO HER UPON DR Aline Brochure SIGNING OFF Workers Comp, Key Risk, Attention Wilfred Curtis, nurse Tourist information centre manager, fax # [phone]

## 2013-10-24 ENCOUNTER — Encounter: Payer: Self-pay | Admitting: Orthopedic Surgery

## 2013-10-24 ENCOUNTER — Ambulatory Visit (INDEPENDENT_AMBULATORY_CARE_PROVIDER_SITE_OTHER): Payer: Worker's Compensation | Admitting: Orthopedic Surgery

## 2013-10-24 VITALS — BP 144/84 | Ht 70.0 in | Wt 219.0 lb

## 2013-10-24 DIAGNOSIS — M75120 Complete rotator cuff tear or rupture of unspecified shoulder, not specified as traumatic: Secondary | ICD-10-CM

## 2013-10-24 DIAGNOSIS — M75122 Complete rotator cuff tear or rupture of left shoulder, not specified as traumatic: Secondary | ICD-10-CM

## 2013-10-24 DIAGNOSIS — Z9889 Other specified postprocedural states: Secondary | ICD-10-CM

## 2013-10-24 MED ORDER — METHOCARBAMOL 500 MG PO TABS: 500.0000 mg | ORAL_TABLET | Freq: Four times a day (QID) | ORAL | Status: DC | PRN

## 2013-10-24 NOTE — Progress Notes (Signed)
Chief Complaint  Patient presents with  . Follow-up    follow up Left RCR, DOS 06/13/13     The patient is now about 4-1/2 months post rotator cuff repair left shoulder. Making progress. He takes his pain medication when he goes to therapy. He has made gains now to include active forward elevation of approximately 80 and active abduction 70 active external rotation 40.  It is okay at this point for him to return to light duty with no lifting above 5 pounds with the left arm and just duty. We expect his recovery to take a full year based on his injury pattern and what was required to repair her shoulder  In the physical therapy department he was able to obtain 140 of flexion 57 extension and 90 abduction with internal rotation to L5 external rotation to the occiput. Passive range of motion 140 of flexion 142 abduction 72 internal rotation and 51 external.  Continue hydrocodone for pain as needed continue Robaxin as needed continue therapy additional 4 weeks followup with me x1 month  Return to work next available were for light duty as described addendum under review of systems, the patient complains of a mass on the anterior aspect of his left arm near the medial side of his biceps which is consistent with lipoma it is not painful it does not affect his elbow flexion.

## 2013-10-24 NOTE — Patient Instructions (Addendum)
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

## 2013-10-25 ENCOUNTER — Ambulatory Visit: Payer: Self-pay | Admitting: Orthopedic Surgery

## 2013-10-30 ENCOUNTER — Ambulatory Visit: Payer: Self-pay | Admitting: Orthopedic Surgery

## 2013-11-19 ENCOUNTER — Telehealth: Payer: Self-pay | Admitting: Orthopedic Surgery

## 2013-11-19 NOTE — Telephone Encounter (Signed)
Notes (Date of service 10/24/13) including work status note, and physical therapy order, faxed to Workers Comp, [employer], Attention Wilfred Curtis, nurse case manager, fax # [phone], ph# [phone].  Patient aware.

## 2013-11-20 NOTE — Telephone Encounter (Signed)
Spoke with [facility], Nena Jordan (ph# [phone]); a plan of care document was faxed and received, and is being signed and faxed back to [phone]; no new order is needed per Lorriane Shire.  Workers Comp third Medical sales representative, Psychiatrist aware of status.

## 2013-11-27 ENCOUNTER — Encounter: Payer: Self-pay | Admitting: Orthopedic Surgery

## 2013-11-27 ENCOUNTER — Telehealth: Payer: Self-pay | Admitting: Orthopedic Surgery

## 2013-11-27 ENCOUNTER — Ambulatory Visit (INDEPENDENT_AMBULATORY_CARE_PROVIDER_SITE_OTHER): Payer: Worker's Compensation | Admitting: Orthopedic Surgery

## 2013-11-27 VITALS — BP 121/76 | Ht 70.0 in | Wt 219.0 lb

## 2013-11-27 DIAGNOSIS — M75120 Complete rotator cuff tear or rupture of unspecified shoulder, not specified as traumatic: Secondary | ICD-10-CM

## 2013-11-27 DIAGNOSIS — Z9889 Other specified postprocedural states: Secondary | ICD-10-CM

## 2013-11-27 DIAGNOSIS — M75122 Complete rotator cuff tear or rupture of left shoulder, not specified as traumatic: Secondary | ICD-10-CM

## 2013-11-27 MED ORDER — METHOCARBAMOL 500 MG PO TABS: 500.0000 mg | ORAL_TABLET | Freq: Four times a day (QID) | ORAL | Status: DC | PRN

## 2013-11-27 NOTE — Progress Notes (Signed)
Chief Complaint  Patient presents with  . Follow-up    recheck Left RCR, DOS 06/13/13   BP 121/76  Ht 5\' 10"  (1.778 m)  Wt 219 lb (99.338 kg)  BMI 31.42 kg/m2  Review of systems the lipoma/mass in the front of his arm is not causing any problems at this point Operative report PRE-OPERATIVE DIAGNOSIS:  Left Rotator Cuff Tear,acromioclavicular joint arthritis,   POST-OPERATIVE DIAGNOSIS:  Left massive Rotator Cuff Tear, acromioclavicular arthritis , biceps tendon subluxation  Operative findings Large inferior spur distal clavicle Large inferior spur anterior acromion Massive rotator cuff tear with acute tear of the superior edge of the subscapularis  Biceps tendon subluxation Supraspinatus and infraspinatus chronic tear Teres minor intact  poor tendon quality of the supraspinatus and infraspinatus with massive retraction just medial to the glenoid Significant scarring and stiffness of the shoulder with intraoperative range of motion under anesthesia abduction 75 flexion 90 external rotation 15  Implants 3 [employer] speed screws   2 [employer]rex corkscrew anchors  PROCEDURE:  Procedure(s): ROTATOR CUFF REPAIR SHOULDER OPEN (Left) RESECTION DISTAL CLAVICAL (Left)   There was some issue with an order for therapy so he missed about a week and a half of therapy. He will require therapy for up to a years time he'll not return to full work until years time he will probably have a permanent partial impairment  Is awake alert and oriented x3 mood and affect are normal. His gait and station are normal. Has no tenderness around his incision. He has a normal well-healed incision. No swelling. Neurovascular exam intact. He currently has passive range of motion forward elevation 140 external rotation 40 abduction 100  He has some pain maneuvering his arm down from forward elevation he has a lot of scapular substitution  Recommend continued physical therapy return in a month he should get  it go back to work light duty if they have something but last time they didn't we will resubmit for light duty.  Meds ordered this encounter  Medications  . methocarbamol (ROBAXIN) 500 MG tablet    Sig: Take 1 tablet (500 mg total) by mouth every 6 (six) hours as needed for muscle spasms.    Dispense:  60 tablet    Refill:  0    Not requiring any pain medication at this time does muscle relaxers and ibuprofen 800 mg

## 2013-11-27 NOTE — Patient Instructions (Signed)
Continue therapy  Light duty at work, Left arm lift less than 5 lbs, desk duty only

## 2013-11-27 NOTE — Telephone Encounter (Signed)
Notes faxed, date of service 11/27/13, to Workers Comp, Key Risk, Attention Wilfred Curtis, nurse case manager, fax # [phone], ph# [phone]. Patient aware

## 2013-12-27 ENCOUNTER — Encounter: Payer: Self-pay | Admitting: Orthopedic Surgery

## 2013-12-27 ENCOUNTER — Telehealth: Payer: Self-pay | Admitting: Orthopedic Surgery

## 2013-12-27 ENCOUNTER — Ambulatory Visit (INDEPENDENT_AMBULATORY_CARE_PROVIDER_SITE_OTHER): Payer: Worker's Compensation | Admitting: Orthopedic Surgery

## 2013-12-27 VITALS — BP 137/72 | Ht 70.0 in | Wt 219.0 lb

## 2013-12-27 DIAGNOSIS — Z9889 Other specified postprocedural states: Secondary | ICD-10-CM

## 2013-12-27 DIAGNOSIS — M75102 Unspecified rotator cuff tear or rupture of left shoulder, not specified as traumatic: Secondary | ICD-10-CM

## 2013-12-27 MED ORDER — IBUPROFEN 800 MG PO TABS: 800.0000 mg | ORAL_TABLET | Freq: Three times a day (TID) | ORAL | Status: AC | PRN

## 2013-12-27 NOTE — Patient Instructions (Signed)
Continue therapy, increase weights with therapy  Continue Ibuprofen and Robaxin  Work- office work only

## 2013-12-27 NOTE — Progress Notes (Signed)
Patient ID: Connor Sanford, male   DOB: [birth year], 58 y.o.   MRN: [id number] Chief Complaint  Patient presents with  . Follow-up    1 month recheck left shoulder RCR DOS 06/13/13    28 weeks status post rotator cuff repair left shoulder  Procedure rotator cuff repair open left shoulder with resection of distal clavicle repair of massive rotator cuff tear which included subscapularis tear and biceps tendon subluxation along with chronic supraspinatus infraspinatus tear with poor tendon quality and significant retraction  Therapy at hand in rehabilitation he can now elevate his arm 120 he still having a lot of scapular substitution issues.  However he is pain is much better and controlled with Robaxin and ibuprofen on an intermittent basis  He denies any numbness or tingling  I agree with therapy assessment to continue therapy I will write him an order for 6 weeks of therapy 3 times a week  Increase weights while he is doing therapy continue to work on scapulothoracic dysrhythmia  We will consider work hardening at a later date  Continue work restrictions office duty only

## 2013-12-27 NOTE — Telephone Encounter (Signed)
Notes faxed, date of service 12/27/13, to Workers Comp, Key Risk, Attention Wilfred Curtis, nurse case manager, fax # [phone], ph# [phone]. Patient aware

## 2014-01-11 ENCOUNTER — Encounter: Payer: Self-pay | Admitting: Nurse Practitioner

## 2014-01-11 ENCOUNTER — Ambulatory Visit (INDEPENDENT_AMBULATORY_CARE_PROVIDER_SITE_OTHER): Payer: PRIVATE HEALTH INSURANCE | Admitting: Nurse Practitioner

## 2014-01-11 VITALS — BP 122/74 | Temp 98.4°F | Ht 70.0 in | Wt 227.0 lb

## 2014-01-11 DIAGNOSIS — J069 Acute upper respiratory infection, unspecified: Secondary | ICD-10-CM

## 2014-01-11 MED ORDER — AMOXICILLIN-POT CLAVULANATE 875-125 MG PO TABS: 1.0000 | ORAL_TABLET | Freq: Two times a day (BID) | ORAL | Status: DC

## 2014-01-11 NOTE — Patient Instructions (Signed)
Robitussin CF as directed

## 2014-01-15 ENCOUNTER — Encounter: Payer: Self-pay | Admitting: Nurse Practitioner

## 2014-01-15 NOTE — Progress Notes (Signed)
Subjective:  Presents for c/o cough x 1 week. No further fever. Slight headache. Frequent nonproductive cough. Head congestion mainly in the AM. No sore throat or ear pain. No wheezing. No reflux. Minimal relief with Delsym.   Objective:   BP 122/74 mmHg  Temp(Src) 98.4 F (36.9 C) (Oral)  Ht 5\' 10"  (1.778 m)  Wt 227 lb (102.967 kg)  BMI 32.57 kg/m2 NAD. Alert, oriented. TMs clear effusion. Pharynx mildly injected. Neck supple with mild anterior adenopathy. Lungs clear. Heart RRR.  Assessment: Acute upper respiratory infection  Plan:  Meds ordered this encounter  Medications  . amoxicillin-clavulanate (AUGMENTIN) 875-125 MG per tablet    Sig: Take 1 tablet by mouth 2 (two) times daily.    Dispense:  20 tablet    Refill:  0    Order Specific Question:  Supervising Provider    Answer:  Mikey Kirschner [[id number]]   OTC meds as directed. Call back if worsens or persists.

## 2014-01-28 ENCOUNTER — Ambulatory Visit (INDEPENDENT_AMBULATORY_CARE_PROVIDER_SITE_OTHER): Payer: PRIVATE HEALTH INSURANCE | Admitting: Family Medicine

## 2014-01-28 ENCOUNTER — Encounter: Payer: Self-pay | Admitting: Family Medicine

## 2014-01-28 VITALS — BP 120/88 | Temp 98.5°F | Ht 70.0 in | Wt 228.0 lb

## 2014-01-28 DIAGNOSIS — R21 Rash and other nonspecific skin eruption: Secondary | ICD-10-CM

## 2014-01-28 DIAGNOSIS — J440 Chronic obstructive pulmonary disease with acute lower respiratory infection: Secondary | ICD-10-CM

## 2014-01-28 MED ORDER — BENZONATATE 100 MG PO CAPS: 100.0000 mg | ORAL_CAPSULE | Freq: Four times a day (QID) | ORAL | Status: DC | PRN

## 2014-01-28 MED ORDER — DOXYCYCLINE HYCLATE 100 MG PO TABS: 100.0000 mg | ORAL_TABLET | Freq: Two times a day (BID) | ORAL | Status: DC

## 2014-01-28 NOTE — Progress Notes (Signed)
   Subjective:    Patient ID: Connor Sanford, male    DOB: 1956/01/01, 58 y.o.   MRN: [id number]  HPI Comments: Also pulled a tick off of his back yesterday.   Cough This is a new problem. Episode onset: was seen here on 11/13. The problem has been gradually improving. The cough is non-productive. Associated symptoms include nasal congestion. Treatments tried: antibiotics. The treatment provided mild relief.   Very little prod  Worse in the morn, bronch cough worse daytime too  Reg tick, Persist duration unknown Cough now for several weeks.  Had a tick bite. Concerned about the enlarging red spot on his shoulder.    Review of Systems  Respiratory: Positive for cough.    no vomiting no diarrhea no rash     Objective:   Physical Exam Alert no apparent distress. Lungs clear. Heart regular in rhythm. H&T moderate nasal congestion. Intermittent bronchial cough. Right shoulder enlarging erythematous patch at site of tick bite. Nontender       Assessment & Plan:  Impression tick bite with localized reaction #2 persistent bronchitis plan doxy 100 twice a day 10 days. Tessalon Perles one every 6 when necessary for cough. Symptomatic care discussed. WSL

## 2014-02-11 ENCOUNTER — Encounter: Payer: Self-pay | Admitting: Orthopedic Surgery

## 2014-02-11 ENCOUNTER — Ambulatory Visit (INDEPENDENT_AMBULATORY_CARE_PROVIDER_SITE_OTHER): Payer: Worker's Compensation | Admitting: Orthopedic Surgery

## 2014-02-11 VITALS — BP 133/78 | Ht 70.0 in | Wt 228.0 lb

## 2014-02-11 DIAGNOSIS — M75122 Complete rotator cuff tear or rupture of left shoulder, not specified as traumatic: Secondary | ICD-10-CM

## 2014-02-11 DIAGNOSIS — M75102 Unspecified rotator cuff tear or rupture of left shoulder, not specified as traumatic: Secondary | ICD-10-CM

## 2014-02-11 DIAGNOSIS — Z9889 Other specified postprocedural states: Secondary | ICD-10-CM

## 2014-02-11 MED ORDER — METHOCARBAMOL 500 MG PO TABS: 500.0000 mg | ORAL_TABLET | Freq: Four times a day (QID) | ORAL | Status: DC | PRN

## 2014-02-11 NOTE — Patient Instructions (Signed)
WORK NOTE, OFFICE WORK ONLY

## 2014-02-11 NOTE — Progress Notes (Signed)
Chief Complaint  Patient presents with  . Follow-up    6 week recheck left shoulder, RCR, DOS 06/13/13    Encounter Diagnoses  Name Primary?  . Rotator cuff tear, left Yes  . Complete rotator cuff tear of left shoulder   . S/P rotator cuff repair     8 months post massive rotator cuff repair the patient's motion is improving his scapular substitution though present is less so today. His passive motion in the office was 115 of abduction 45 of external rotation with his arm at his side and 130 of forward elevation  He will continue his therapy I will see him in 6 weeks he should continue work status as office only and when he comes back we can start work conditioning for the month of February and March.

## 2014-02-15 ENCOUNTER — Telehealth: Payer: Self-pay | Admitting: Family Medicine

## 2014-02-15 MED ORDER — BENZONATATE 100 MG PO CAPS: 100.0000 mg | ORAL_CAPSULE | Freq: Four times a day (QID) | ORAL | Status: DC | PRN

## 2014-02-15 MED ORDER — AMOXICILLIN-POT CLAVULANATE 875-125 MG PO TABS: 1.0000 | ORAL_TABLET | Freq: Two times a day (BID) | ORAL | Status: AC

## 2014-02-15 NOTE — Telephone Encounter (Signed)
Left message on voicemail notifying patient that medication has been sent to pharmacy.

## 2014-02-15 NOTE — Telephone Encounter (Signed)
Pt seen 11/30 and 12/14 for a bronchitis/cough symptoms He is still coughing and wants to know if you will call him in some  Antibiotics at this point he feels he needs them an some more of the Tessalon pearls   No fever present, little sore in the chest from the cough other than that No new symptoms, just the chronic cough   reids pharm

## 2014-02-15 NOTE — Telephone Encounter (Signed)
Aug 875 bid ten d and ref tess perles num b thirty 100mg  one q 6 hrs prn

## 2014-02-15 NOTE — Telephone Encounter (Signed)
Patient seen 01/28/14, diagnosed with bronchitis, prescribed Doxycycline 100 mg BID x 10 days and Tessalon Perles

## 2014-02-27 ENCOUNTER — Telehealth: Payer: Self-pay | Admitting: Orthopedic Surgery

## 2014-02-27 NOTE — Telephone Encounter (Signed)
Notes, date of service 02/11/14,to Workers Comp, Key Risk, Attention Wilfred Curtis, nurse case manager, fax # [phone], ph# [phone] as per request. Patient aware. Also faxed ROI request - sent all notes, operative report, imaging reports, to [employer], attention: Nash Mantis, fax # [phone], ph# [phone], as per request.  Patient aware.

## 2014-03-25 ENCOUNTER — Ambulatory Visit (INDEPENDENT_AMBULATORY_CARE_PROVIDER_SITE_OTHER): Payer: Worker's Compensation | Admitting: Orthopedic Surgery

## 2014-03-25 ENCOUNTER — Encounter: Payer: Self-pay | Admitting: Orthopedic Surgery

## 2014-03-25 VITALS — BP 112/65 | Ht 70.0 in | Wt 228.0 lb

## 2014-03-25 DIAGNOSIS — M75102 Unspecified rotator cuff tear or rupture of left shoulder, not specified as traumatic: Secondary | ICD-10-CM

## 2014-03-25 DIAGNOSIS — Z9889 Other specified postprocedural states: Secondary | ICD-10-CM

## 2014-03-25 NOTE — Patient Instructions (Signed)
Start work Immunologist duty only x 2 months

## 2014-03-25 NOTE — Progress Notes (Signed)
Chief Complaint  Patient presents with  . Follow-up    6 week recheck on left shoulder after therapy. DOS 06-13-13. Worker's compensation.    Encounter Diagnoses  Name Primary?  . Rotator cuff tear, left Yes  . S/P rotator cuff repair     The patient is doing well status post repair of massive rotator cuff tear he now can actively forward elevate his shoulder in the scapular plane of 120. He still has weakness when holding things such as a gallon of milk with his arm fully extended.  Review of systems negative he takes ibuprofen and Robaxin on an as-needed basis  BP 112/65 mmHg  Ht 5\' 10"  (1.778 m)  Wt 228 lb (103.42 kg)  BMI 32.71 kg/m2 Today he is in great spirits he is oriented 3 mood and affect are good is appearance is normal for elevation again is 120 of forward flexion he has mild scapular substitution the shoulder is stable neurovascular exam is intact  Improving  Recommend work conditioning  Follow-up 2 months  Continue office duty only; rating next visit

## 2014-03-26 ENCOUNTER — Telehealth: Payer: Self-pay | Admitting: Orthopedic Surgery

## 2014-03-26 NOTE — Telephone Encounter (Signed)
Order/prescription for work conditioning faxed to Workers Comp's third party contact: [employer], fax [phone], ph# [phone], as per request.

## 2014-03-29 ENCOUNTER — Encounter: Payer: Self-pay | Admitting: Family Medicine

## 2014-03-29 ENCOUNTER — Ambulatory Visit (INDEPENDENT_AMBULATORY_CARE_PROVIDER_SITE_OTHER): Payer: PRIVATE HEALTH INSURANCE | Admitting: Family Medicine

## 2014-03-29 VITALS — BP 102/78 | Temp 98.3°F | Ht 70.0 in | Wt 224.0 lb

## 2014-03-29 DIAGNOSIS — R05 Cough: Secondary | ICD-10-CM

## 2014-03-29 DIAGNOSIS — R059 Cough, unspecified: Secondary | ICD-10-CM

## 2014-03-29 DIAGNOSIS — J321 Chronic frontal sinusitis: Secondary | ICD-10-CM

## 2014-03-29 MED ORDER — ALPRAZOLAM 1 MG PO TABS: 1.0000 mg | ORAL_TABLET | Freq: Every evening | ORAL | Status: DC | PRN

## 2014-03-29 MED ORDER — ALBUTEROL SULFATE HFA 108 (90 BASE) MCG/ACT IN AERS: 2.0000 | INHALATION_SPRAY | Freq: Four times a day (QID) | RESPIRATORY_TRACT | Status: DC | PRN

## 2014-03-29 MED ORDER — LEVOFLOXACIN 500 MG PO TABS: 500.0000 mg | ORAL_TABLET | Freq: Every day | ORAL | Status: AC

## 2014-03-29 MED ORDER — METHYLPREDNISOLONE ACETATE 40 MG/ML IJ SUSP
80.0000 mg | Freq: Once | INTRAMUSCULAR | Status: AC
  Administered 2014-03-29: 80 mg via INTRAMUSCULAR

## 2014-03-29 MED ORDER — BENZONATATE 100 MG PO CAPS
100.0000 mg | ORAL_CAPSULE | Freq: Four times a day (QID) | ORAL | Status: DC | PRN
Start: 2014-03-29 — End: 2014-06-06

## 2014-03-29 NOTE — Progress Notes (Signed)
   Subjective:    Patient ID: Connor Sanford, male    DOB: 09/25/1955, 59 y.o.   MRN: [id number]  Cough This is a recurrent problem. Episode onset: November. The problem has been waxing and waning. The cough is productive of sputum. Associated symptoms include chest pain and nasal congestion. Nothing aggravates the symptoms. He has tried OTC cough suppressant (3 rounds of antibiotics) for the symptoms. The treatment provided no relief.   Persistent cough nd cong  Off and on the whole winter  This wk got  A lot worse  No f4ever, cong, and drainage  Got the flu shot    Review of Systems  Respiratory: Positive for cough.   Cardiovascular: Positive for chest pain.   No vomiting no diarrhea no chills    Objective:   Physical Exam Alert moderate malaise. H&T moderate his congestion frontal tenderness. Pharynx normal neck supple. Lungs positive wheezes with cough heart regular in rhythm.       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 Tessalon Perles when necessary. Levaquin daily for 14 days. Depo-Medrol injection. WSL

## 2014-05-04 ENCOUNTER — Other Ambulatory Visit: Payer: Self-pay | Admitting: Cardiovascular Disease

## 2014-05-06 NOTE — Telephone Encounter (Signed)
Rx(s) sent to pharmacy electronically.  

## 2014-05-08 ENCOUNTER — Ambulatory Visit (INDEPENDENT_AMBULATORY_CARE_PROVIDER_SITE_OTHER): Payer: Worker's Compensation | Admitting: Orthopedic Surgery

## 2014-05-08 ENCOUNTER — Encounter: Payer: Self-pay | Admitting: Orthopedic Surgery

## 2014-05-08 VITALS — BP 141/81 | Ht 70.0 in | Wt 220.4 lb

## 2014-05-08 DIAGNOSIS — Z9889 Other specified postprocedural states: Secondary | ICD-10-CM

## 2014-05-08 DIAGNOSIS — M75102 Unspecified rotator cuff tear or rupture of left shoulder, not specified as traumatic: Secondary | ICD-10-CM

## 2014-05-08 NOTE — Progress Notes (Signed)
Office visit for rating  Chief Complaint  Patient presents with  . 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 He slipped on ice. His initial treatment was a sling immobilization and ibuprofen with Norco for pain. He did not improve and had an MRI which showed a rotator cuff tear.  The MRI scan showed a large full-thickness tear with retracted tear of the infraspinatus and supraspinatus dislocation the long head of the biceps tendon and intrasubstance tear of the subscapularis with marked hypertrophy of the acromioclavicular joint with glenohumeral joint effusion and superior migration of the head of the humerus and articulation of the head with the acromion  The patient consented for surgical intervention and underwent open left rotator cuff repair and resection of the distal clavicle.date of surgery 06/12/2013  Operative findings large inferior spur of the distal clavicle, large inferior spur anterior acromion, massive rotator cuff tear with acute tear of the superior edge of the subscapularis with biceps tendon subluxation and supraspinatus and infraspinatus chronic tearing with teres minor intact. We noted poor tendon quality supraspinatus and infraspinatus with retraction medial to the glenoid. There was significant scarring and stiffness of the shoulder with intraoperative range of motion anesthesia: Abduction 75, flexion 90, external rotation 15.  A 5 anchor cuff repair was performed  Distal clavicle was removed    The patient has undergone physical therapy and is currently in a work conditioning/work hardening type program.  His pain at rest is 0 out of 10, with activities such as exercises to rehabilitation his shoulder he has mild discomfort and still rates his pain 0 out of 10.  Functional deficits he has difficulty reaching  behind his back. He has 10 pound forward elevation ability with his left surgically repaired arm , he has no trouble with routine activities of daily living.  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  His physical exam today was external rotation with his arm at his side 65 right 50 left Forward was 135 left 180 right Abduction external rotation 90-50 left 90-105 right  External rotation strength to manual muscle testing grade 5 right 5 minus left Internal rotation grade 5 right grade 5 left Supraspinatus testing and the empty can position grade 5 right grade 5 minus left   We will reexamine him First of April when his work conditioning has finished  We expect return to work after that date  [facility] allows for ankylosis in optimal position as well as suboptimal in position and also allows for 5% plus limitation for distal clavicle excision  We will complete this report and include it in our next visit

## 2014-05-09 ENCOUNTER — Telehealth: Payer: Self-pay | Admitting: Orthopedic Surgery

## 2014-05-09 NOTE — Telephone Encounter (Signed)
Notes faxed to Workers [employer], to attention: nurse case manager, [place], fax# [phone]

## 2014-05-22 ENCOUNTER — Ambulatory Visit: Payer: Worker's Compensation | Admitting: Orthopedic Surgery

## 2014-05-23 ENCOUNTER — Ambulatory Visit: Payer: Worker's Compensation | Admitting: Orthopedic Surgery

## 2014-05-27 ENCOUNTER — Encounter: Payer: Self-pay | Admitting: *Deleted

## 2014-06-04 ENCOUNTER — Ambulatory Visit: Payer: Self-pay | Admitting: Cardiovascular Disease

## 2014-06-06 ENCOUNTER — Ambulatory Visit (INDEPENDENT_AMBULATORY_CARE_PROVIDER_SITE_OTHER): Payer: Worker's Compensation | Admitting: Orthopedic Surgery

## 2014-06-06 ENCOUNTER — Encounter: Payer: Self-pay | Admitting: Orthopedic Surgery

## 2014-06-06 VITALS — BP 120/76 | Ht 70.0 in | Wt 220.4 lb

## 2014-06-06 DIAGNOSIS — Z9889 Other specified postprocedural states: Secondary | ICD-10-CM | POA: Diagnosis not present

## 2014-06-06 NOTE — Progress Notes (Addendum)
Chief Complaint  Patient presents with  . Follow-up    1 month follow up left shoulder, DOS 06/13/13    The patient comes in for his final checkup prior to going back to work on April 18. He's finished all of his work conditioning and he is doing very well.  He's not taking anything other than ibuprofen on occasion. He is confident now that he can do his job requirements. System review related to the shoulder no numbness or tingling mild weakness mild rotation loss   On physical exam we note that he awake alert and oriented 3 mood and affect normal no tenderness around the shoulder joint external rotation loss is noted no instability  he has 90 abduction 50 external rotation on the left which is different from his 105 external rotation on the right.  He has very slight weakness to manual muscle testing of the supraspinatus and the empty can position.  As far as her rating goes as noted in his previous note [employer] commission allows 5% for removing the distal clavicle It also gives physician discretion regarding other parameters such as pain. He is not having any pain so we rate 0% for that He does have some loss of motion approximately 55 of external rotation we rate 10% for that We also note weakness to manual muscle testing supraspinatus we rate 5% for that  We give a total rating of 20% left shoulder  Follow-up as needed patient released

## 2014-06-06 NOTE — Addendum Note (Signed)
Addended by: [name] on: 06/06/2014 03:18 PM   Modules accepted: Level of Service

## 2014-06-06 NOTE — Patient Instructions (Signed)
RETURN TO WORK FULL DUTY April 18, NO RESTRICTIONS

## 2014-06-07 ENCOUNTER — Telehealth: Payer: Self-pay | Admitting: Orthopedic Surgery

## 2014-06-07 NOTE — Telephone Encounter (Signed)
Notes for date of service 06/06/14 faxed to Workers [employer], to attention: nurse case manager, [place], fax# [phone]

## 2014-06-11 ENCOUNTER — Telehealth: Payer: Self-pay | Admitting: Orthopedic Surgery

## 2014-06-11 NOTE — Telephone Encounter (Signed)
Faxed form to patient's employer's accident insurance, [employer], to fax# [phone]/ph# [phone]; patient aware.

## 2014-06-21 ENCOUNTER — Ambulatory Visit (INDEPENDENT_AMBULATORY_CARE_PROVIDER_SITE_OTHER): Payer: PRIVATE HEALTH INSURANCE | Admitting: Cardiovascular Disease

## 2014-06-21 ENCOUNTER — Encounter: Payer: Self-pay | Admitting: Cardiovascular Disease

## 2014-06-21 VITALS — BP 130/82 | HR 55 | Ht 70.0 in | Wt 221.0 lb

## 2014-06-21 DIAGNOSIS — I2510 Atherosclerotic heart disease of native coronary artery without angina pectoris: Secondary | ICD-10-CM

## 2014-06-21 DIAGNOSIS — E785 Hyperlipidemia, unspecified: Secondary | ICD-10-CM | POA: Insufficient documentation

## 2014-06-21 MED ORDER — NITROGLYCERIN 0.4 MG SL SUBL: 0.4000 mg | SUBLINGUAL_TABLET | SUBLINGUAL | Status: DC | PRN

## 2014-06-21 NOTE — Progress Notes (Signed)
06/21/2014 Connor Sanford   Jul 07, 1955  [id number]  Primary Physician Mickie Hillier, MD Primary Cardiologist: [name] MD [name]   HPI: The patient is a 59 year old mildly overweight married Caucasian male, father of 2, who retired on Jun 29 2012 from working at the [employer]. I last saw him in the office 06/01/13.He has a history of hyperlipidemia as well as a strong family history of heart disease and a father who had bypass surgery age 15. 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 He did have moderate inferolateral hypokinesia with an EF of 45% to 50%, with a peak CPK of 1000 and MB of 143. Echocardiogram performed April 06, 2009, showed normal LV systolic function without segmental wall motion abnormalities, and Myoview showed inferoapical scar without ischemia. He did participate in cardiac rehab. He has been asymptomatic since I last saw him . His most recent lipid profile performed 10/20/12 revealed a total cholesterol 126, LDL 76 and HDL of 33. He did fall down in January and injured his left shoulder and now requiring rotator cuff repair. A Myoview stress test performed on 05/23/13 for preoperative clearance revealed a small area of apical scar with preserved ejection fraction and no ischemia. He underwent rotator cuff surgery successfully and is about to release to go back to work. He denies chest pain or shortness of breath.  Current Outpatient Prescriptions  Medication Sig Dispense Refill  . ALPRAZolam (XANAX) 1 MG tablet Take 1 tablet (1 mg total) by mouth at bedtime as needed for sleep. 30 tablet 2  . aspirin EC 81 MG tablet Take 81 mg by mouth daily.      Marland Kitchen atorvastatin (LIPITOR) 40 MG tablet TAKE ONE (1) TABLET BY MOUTH EVERY DAY 30 tablet 6  . clopidogrel (PLAVIX) 75 MG tablet Take 1 tablet by mouth daily. 30 tablet 11  .  diphenhydramine-acetaminophen (TYLENOL PM) 25-500 MG TABS Take 1 tablet by mouth at bedtime as needed (for sleep).    . fexofenadine-pseudoephedrine (ALLEGRA-D) 60-120 MG per tablet Take 1 tablet by mouth 2 (two) times daily.    . metoprolol tartrate (LOPRESSOR) 25 MG tablet Take 0.5 tablets (12.5 mg total) by mouth 2 (two) times daily. 30 tablet 0  . Multiple Vitamins-Minerals (MULTIVITAMIN WITH IRON-MINERALS) liquid Take 30 mLs by mouth daily.       No current facility-administered medications for this visit.    No Known Allergies  History   Social History  . Marital Status: Married    Spouse Name: N/A  . Number of Children: N/A  . Years of Education: N/A   Occupational History  . Not on file.   Social History Main Topics  . Smoking status: Never Smoker   . Smokeless tobacco: Not on file  . Alcohol Use: No  . Drug Use: No  . Sexual Activity: Yes    Birth Control/ Protection: None   Other Topics Concern  . Not on file   Social History Narrative     Review of Systems: General: negative for chills, fever, night sweats or weight changes.  Cardiovascular: negative for chest pain, dyspnea on exertion, edema, orthopnea, palpitations, paroxysmal nocturnal dyspnea or shortness of breath Dermatological: negative for rash Respiratory: negative for cough or wheezing Urologic: negative for hematuria Abdominal: negative for nausea, vomiting, diarrhea, bright red blood per rectum, melena, or hematemesis Neurologic: negative for visual changes, syncope, or  dizziness All other systems reviewed and are otherwise negative except as noted above.    Blood pressure 130/82, pulse 55, height 5\' 10"  (1.778 m), weight 221 lb (100.245 kg).  General appearance: alert and no distress Neck: no adenopathy, no carotid bruit, no JVD, supple, symmetrical, trachea midline and thyroid not enlarged, symmetric, no tenderness/mass/nodules Lungs: clear to auscultation bilaterally Heart: regular rate and  rhythm, S1, S2 normal, no murmur, click, rub or gallop Extremities: extremities normal, atraumatic, no cyanosis or edema  EKG sinus bradycardia 55 without ST or T-wave changes. Procedure reviewed this EKG  ASSESSMENT AND PLAN:   Atherosclerotic heart disease History of CAD status post myocardial infarction 01/27/09 secondary to an occluded large second diagonal branch which I stented using 2 overlapping bare metal stents. He did have moderate inferolateral hypokinesia with an EF of 45-50% and a peak CPK of 1000 with an MB of 143. 2-D echo performed 04/06/09 showed normal LV systolic function without wall motion abnormalities . Myoview at that time showed inferoapical scar without ischemia. He did have a stress test performed 05/23/13 done for preoperative clearance before left shoulder rotator cuff surgery that was low risk as well. He denies chest pain or shortness of breath.   Hyperlipidemia History of hyperlipidemia on atorvastatin 40 mg a day followed by his PCP       [name] MD [facility], [facility] 06/21/2014 12:14 PM

## 2014-06-21 NOTE — Assessment & Plan Note (Signed)
History of hyperlipidemia on atorvastatin 40 mg a day followed by his PCP 

## 2014-06-21 NOTE — Patient Instructions (Signed)
Dr Berry recommends that you schedule a follow-up appointment in 1 year. You will receive a reminder letter in the mail two months in advance. If you don't receive a letter, please call our office to schedule the follow-up appointment. 

## 2014-06-21 NOTE — Assessment & Plan Note (Signed)
History of CAD status post myocardial infarction 01/27/09 secondary to an occluded large second diagonal branch which I stented using 2 overlapping bare metal stents. He did have moderate inferolateral hypokinesia with an EF of 45-50% and a peak CPK of 1000 with an MB of 143. 2-D echo performed 04/06/09 showed normal LV systolic function without wall motion abnormalities . Myoview at that time showed inferoapical scar without ischemia. He did have a stress test performed 05/23/13 done for preoperative clearance before left shoulder rotator cuff surgery that was low risk as well. He denies chest pain or shortness of breath.

## 2014-07-11 ENCOUNTER — Telehealth: Payer: Self-pay | Admitting: Orthopedic Surgery

## 2014-07-11 NOTE — Telephone Encounter (Signed)
Patient stopped by office, and said has questions for Dr Aline Brochure, related to his rating; asking if he can consult with him either in office or by phone. His ph#'s are [phone] [facility]) [phone] [facility])

## 2014-07-12 NOTE — Telephone Encounter (Signed)
SURE BUT TELL HIM RATINGS ARE DETERMINED BY [place] [employer]

## 2014-07-12 NOTE — Telephone Encounter (Addendum)
Called patient, left voice message on cell phone# to return call (offer Wed am, 07/17/14). Called back to patient Monday, 07/15/14 - left voice message on home phone.

## 2014-07-22 ENCOUNTER — Other Ambulatory Visit: Payer: Self-pay | Admitting: Orthopedic Surgery

## 2014-07-22 ENCOUNTER — Other Ambulatory Visit: Payer: Self-pay | Admitting: Cardiovascular Disease

## 2014-07-22 ENCOUNTER — Other Ambulatory Visit: Payer: Self-pay | Admitting: *Deleted

## 2014-07-22 NOTE — Telephone Encounter (Signed)
Rx(s) sent to pharmacy electronically.  

## 2014-07-23 ENCOUNTER — Telehealth: Payer: Self-pay | Admitting: Cardiovascular Disease

## 2014-07-23 MED ORDER — METOPROLOL TARTRATE 25 MG PO TABS: 12.5000 mg | ORAL_TABLET | Freq: Two times a day (BID) | ORAL | Status: DC

## 2014-07-23 NOTE — Telephone Encounter (Signed)
Advised pharmacist no change to dose noted on last office visit, refill error probably occurred. Reverted to original dosing.

## 2014-07-23 NOTE — Telephone Encounter (Signed)
Connor Sanford called in to get clarification on a prescription written by Dr. Gwenlyn Found . He is inquiring about the prescription for Metoprolol 25 mg. He says the directions read 1 tab po BID #30. The original prescription was 1/2 tab BID #30. He would like to know which one is the correct one. Please call  Thanks

## 2014-07-24 ENCOUNTER — Other Ambulatory Visit: Payer: Self-pay | Admitting: Cardiovascular Disease

## 2014-07-25 NOTE — Telephone Encounter (Signed)
Rx(s) sent to pharmacy electronically.  

## 2014-09-23 ENCOUNTER — Other Ambulatory Visit: Payer: Self-pay | Admitting: Cardiovascular Disease

## 2015-03-04 ENCOUNTER — Ambulatory Visit (INDEPENDENT_AMBULATORY_CARE_PROVIDER_SITE_OTHER): Payer: PRIVATE HEALTH INSURANCE | Admitting: Family Medicine

## 2015-03-04 ENCOUNTER — Encounter: Payer: Self-pay | Admitting: Family Medicine

## 2015-03-04 VITALS — BP 120/68 | Temp 98.1°F | Ht 70.0 in | Wt 225.2 lb

## 2015-03-04 DIAGNOSIS — R05 Cough: Secondary | ICD-10-CM

## 2015-03-04 DIAGNOSIS — J321 Chronic frontal sinusitis: Secondary | ICD-10-CM | POA: Diagnosis not present

## 2015-03-04 DIAGNOSIS — J440 Chronic obstructive pulmonary disease with acute lower respiratory infection: Secondary | ICD-10-CM

## 2015-03-04 DIAGNOSIS — R059 Cough, unspecified: Secondary | ICD-10-CM

## 2015-03-04 MED ORDER — ALBUTEROL SULFATE HFA 108 (90 BASE) MCG/ACT IN AERS: 2.0000 | INHALATION_SPRAY | Freq: Four times a day (QID) | RESPIRATORY_TRACT | Status: DC | PRN

## 2015-03-04 MED ORDER — METHYLPREDNISOLONE ACETATE 80 MG/ML IJ SUSP: 80.0000 mg | Freq: Once | INTRAMUSCULAR | Status: DC

## 2015-03-04 MED ORDER — LEVOFLOXACIN 500 MG PO TABS: ORAL_TABLET | ORAL | Status: DC

## 2015-03-04 MED ORDER — METHYLPREDNISOLONE ACETATE 80 MG/ML IJ SUSP
80.0000 mg | Freq: Once | INTRAMUSCULAR | Status: AC
  Administered 2015-03-04: 80 mg via INTRAMUSCULAR

## 2015-03-04 MED ORDER — BENZONATATE 100 MG PO CAPS: ORAL_CAPSULE | ORAL | Status: DC

## 2015-03-04 NOTE — Progress Notes (Signed)
   Subjective:    Patient ID: Connor Sanford, male    DOB: November 13, 1955, 60 y.o.   MRN: [id number]  Cough This is a new problem. The current episode started in the past 7 days. The cough is productive of sputum. Associated symptoms include headaches and a sore throat. Treatments tried: Allegra D, Delsyum.   Patient states no other concerns this visit.  Son had similar  Cough and cong and coguh  Headache  Some productive cough and cong and junky Cough rough yest  Last yre had sinilar'  achey and sore  Review of Systems  HENT: Positive for sore throat.   Respiratory: Positive for cough.   Neurological: Positive for headaches.       Objective:   Physical Exam Alert mild malaise. H&T moderate his congestion bronchial tenderness trace normal lungs bronchial cough heart regular in rhythm       Assessment & Plan:  Impression subacute rhinosinusitis plan/bronchitis patient wants aggressive therapy plan steroid injection per patient request, antibiotics symptom care WSL

## 2015-05-01 ENCOUNTER — Encounter: Payer: Self-pay | Admitting: Family Medicine

## 2015-05-01 ENCOUNTER — Ambulatory Visit (INDEPENDENT_AMBULATORY_CARE_PROVIDER_SITE_OTHER): Payer: PRIVATE HEALTH INSURANCE | Admitting: Family Medicine

## 2015-05-01 VITALS — BP 120/74 | Ht 70.0 in | Wt 219.5 lb

## 2015-05-01 DIAGNOSIS — Z0000 Encounter for general adult medical examination without abnormal findings: Secondary | ICD-10-CM | POA: Diagnosis not present

## 2015-05-01 DIAGNOSIS — IMO0001 Reserved for inherently not codable concepts without codable children: Secondary | ICD-10-CM

## 2015-05-01 DIAGNOSIS — Z139 Encounter for screening, unspecified: Secondary | ICD-10-CM

## 2015-05-01 DIAGNOSIS — E785 Hyperlipidemia, unspecified: Secondary | ICD-10-CM

## 2015-05-01 MED ORDER — ALPRAZOLAM 1 MG PO TABS: 1.0000 mg | ORAL_TABLET | Freq: Every evening | ORAL | Status: DC | PRN

## 2015-05-01 NOTE — Progress Notes (Signed)
   Subjective:    Patient ID: Connor Sanford, male    DOB: 1955-10-11, 60 y.o.   MRN: [id number]  HPI The patient comes in today for a wellness visit.    A review of their health history was completed.  A review of medications was also completed.  Any needed refills; Yes  Eating habits: Patient states eating habits are good. Tries to eat healthy. States he probably eats more than he should  Falls/  MVA accidents in past few months: None   Regular exercise: Patient states does treadmill sparingly.  Specialist pt sees on regular basis: Technical sales engineer once a year.  Preventative health issues were discussed.   Additional concerns:Patien states no concerns this visit.  Flu shot already   Shoulder better and doing better ea yr    Patient states Xanax once or twice per month as needed for sleep.  Review of Systems  Constitutional: Negative for fever, activity change and appetite change.  HENT: Negative for congestion and rhinorrhea.   Eyes: Negative for discharge.  Respiratory: Negative for cough and wheezing.   Cardiovascular: Negative for chest pain.  Gastrointestinal: Negative for vomiting, abdominal pain and blood in stool.  Genitourinary: Negative for frequency and difficulty urinating.  Musculoskeletal: Negative for neck pain.  Skin: Negative for rash.  Allergic/Immunologic: Negative for environmental allergies and food allergies.  Neurological: Negative for weakness and headaches.  Psychiatric/Behavioral: Negative for agitation.  All other systems reviewed and are negative.      Objective:   Physical Exam  Constitutional: He appears well-developed and well-nourished.  HENT:  Head: Normocephalic and atraumatic.  Right Ear: External ear normal.  Left Ear: External ear normal.  Nose: Nose normal.  Mouth/Throat: Oropharynx is clear and moist.  Eyes: EOM are normal. Pupils are equal, round, and reactive to light.  Neck: Normal range of motion. Neck  supple. No thyromegaly present.  Cardiovascular: Normal rate, regular rhythm and normal heart sounds.   No murmur heard. Pulmonary/Chest: Effort normal and breath sounds normal. No respiratory distress. He has no wheezes.  Abdominal: Soft. Bowel sounds are normal. He exhibits no distension and no mass. There is no tenderness.  Genitourinary: Penis normal.  Musculoskeletal: Normal range of motion. He exhibits no edema.  Lymphadenopathy:    He has no cervical adenopathy.  Neurological: He is alert. He exhibits normal muscle tone.  Skin: Skin is warm and dry. No erythema.  Psychiatric: He has a normal mood and affect. His behavior is normal. Judgment normal.  Vitals reviewed.         Assessment & Plan:   impression 1 well adult exam. Colonoscopy just 2 years ago #2 coronary artery disease asymptomatic sees a cardiologist yearly #3 hyperlipidemia status uncertain. No blood work in the system since 2014 plan diet exercise discussed. Encouraged to get regular blood work. Patient explored sitting can do it if so he will have them do it and get results to [place] diet exercise discussed in encourage WSL

## 2015-07-18 ENCOUNTER — Encounter: Payer: Self-pay | Admitting: Cardiovascular Disease

## 2015-07-18 ENCOUNTER — Ambulatory Visit (INDEPENDENT_AMBULATORY_CARE_PROVIDER_SITE_OTHER): Payer: PRIVATE HEALTH INSURANCE | Admitting: Cardiovascular Disease

## 2015-07-18 VITALS — BP 118/72 | HR 63 | Ht 70.0 in

## 2015-07-18 DIAGNOSIS — E785 Hyperlipidemia, unspecified: Secondary | ICD-10-CM | POA: Diagnosis not present

## 2015-07-18 DIAGNOSIS — I2510 Atherosclerotic heart disease of native coronary artery without angina pectoris: Secondary | ICD-10-CM | POA: Diagnosis not present

## 2015-07-18 NOTE — Patient Instructions (Signed)
Your physician recommends that you continue on your current medications as directed. Please refer to the Current Medication list given to you today.  Dr Gwenlyn Found recommends that you schedule a follow-up appointment in 1 year. You will receive a reminder letter in the mail two months in advance. If you don't receive a letter, please call our office to schedule the follow-up appointment.  If you need a refill on your cardiac medications before your next appointment, please call your pharmacy.

## 2015-07-18 NOTE — Progress Notes (Signed)
07/18/2015 Connor Sanford   October 16, 1955  [id number]  Primary Physician Mickie Hillier, MD Primary Cardiologist: [name] MD [name]   HPI:  The patient is a 60 year old mildly overweight married Caucasian male, father of 2, who retired on Jun 29 2012 from working at the [employer]. I last saw him in the office 4 /22/16. He has a history of hyperlipidemia as well as a strong family history of heart disease and a father who had bypass surgery age 31. 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 He did have moderate inferolateral hypokinesia with an EF of 45% to 50%, with a peak CPK of 1000 and MB of 143. Echocardiogram performed April 06, 2009, showed normal LV systolic function without segmental wall motion abnormalities, and Myoview showed inferoapical scar without ischemia. He did participate in cardiac rehab. He has been asymptomatic since I last saw him . His most recent lipid profile performed 10/20/12 revealed a total cholesterol 126, LDL 76 and HDL of 33. He did fall down in January and injured his left shoulder and now requiring rotator cuff repair. A Myoview stress test performed on 05/23/13 for preoperative clearance revealed a small area of apical scar with preserved ejection fraction and no ischemia. He underwent rotator cuff surgery successfully and is about to release to go back to work. He denies chest pain or shortness of breath.   Current Outpatient Prescriptions  Medication Sig Dispense Refill  . ALPRAZolam (XANAX) 1 MG tablet Take 1 tablet (1 mg total) by mouth at bedtime as needed for sleep. 30 tablet 2  . aspirin EC 81 MG tablet Take 81 mg by mouth daily.      Marland Kitchen atorvastatin (LIPITOR) 40 MG tablet TAKE ONE (1) TABLET BY MOUTH EVERY DAY 30 tablet 9  . clopidogrel (PLAVIX) 75 MG tablet Take 1 tablet (75 mg total) by mouth daily. 30 tablet  11  . diphenhydramine-acetaminophen (TYLENOL PM) 25-500 MG TABS Take 1 tablet by mouth at bedtime as needed (for sleep). Reported on 05/01/2015    . fexofenadine-pseudoephedrine (ALLEGRA-D) 60-120 MG per tablet Take 1 tablet by mouth 2 (two) times daily.    . metoprolol tartrate (LOPRESSOR) 25 MG tablet Take 0.5 tablets (12.5 mg total) by mouth 2 (two) times daily. 30 tablet 9  . Multiple Vitamins-Minerals (MULTIVITAMIN WITH IRON-MINERALS) liquid Take 30 mLs by mouth daily.      . nitroGLYCERIN (NITROSTAT) 0.4 MG SL tablet Place 1 tablet (0.4 mg total) under the tongue every 5 (five) minutes as needed for chest pain. 25 tablet 3   No current facility-administered medications for this visit.    No Known Allergies  Social History   Social History  . Marital Status: Married    Spouse Name: N/A  . Number of Children: N/A  . Years of Education: N/A   Occupational History  . Not on file.   Social History Main Topics  . Smoking status: Never Smoker   . Smokeless tobacco: Not on file  . Alcohol Use: No  . Drug Use: No  . Sexual Activity: Yes    Birth Control/ Protection: None   Other Topics Concern  . Not on file   Social History Narrative     Review of Systems: General: negative for chills, fever, night sweats or weight changes.  Cardiovascular: negative for chest pain, dyspnea on exertion, edema, orthopnea, palpitations, paroxysmal nocturnal  dyspnea or shortness of breath Dermatological: negative for rash Respiratory: negative for cough or wheezing Urologic: negative for hematuria Abdominal: negative for nausea, vomiting, diarrhea, bright red blood per rectum, melena, or hematemesis Neurologic: negative for visual changes, syncope, or dizziness All other systems reviewed and are otherwise negative except as noted above.    Blood pressure 118/72, pulse 63, height 5\' 10"  (1.778 m), SpO2 96 %.  General appearance: alert and no distress Neck: no adenopathy, no carotid bruit, no  JVD, supple, symmetrical, trachea midline and thyroid not enlarged, symmetric, no tenderness/mass/nodules Lungs: clear to auscultation bilaterally Heart: regular rate and rhythm, S1, S2 normal, no murmur, click, rub or gallop Extremities: extremities normal, atraumatic, no cyanosis or edema  EKG normal sinus rhythm at 63 with right axis deviation. I personally reviewed this EKG  ASSESSMENT AND PLAN:   Atherosclerotic heart disease History of CAD status post myocardial infarction 01/27/09 secondary to an occluded large second diagonal branch which I stented using 2 overlapping bare metal stents. He did have moderate inferolateral hypokinesia with an EF of 45-50% with a peak CPK of 1000 and MB of 143. Echocardiogram performed 04/06/09 showed normal LV systolic function without segmental wall motion abnormalities Hear Myoview performed 05/23/13 for preoperative clearance revealing a small area of apical scar without ischemia. He denies chest pain or shortness of breath.  Hyperlipidemia History of hyperlipidemia on statin therapy with recent lipid profile performed 07/10/15 revealed total cholesterol 113, LDL 61 and HDL of 38.      [name] MD FACP,FACC,FAHA, [facility] 07/18/2015 2:37 PM

## 2015-07-18 NOTE — Assessment & Plan Note (Signed)
History of hyperlipidemia on statin therapy with recent lipid profile performed 07/10/15 revealed total cholesterol 113, LDL 61 and HDL of 38.

## 2015-07-18 NOTE — Assessment & Plan Note (Signed)
History of CAD status post myocardial infarction 01/27/09 secondary to an occluded large second diagonal branch which I stented using 2 overlapping bare metal stents. He did have moderate inferolateral hypokinesia with an EF of 45-50% with a peak CPK of 1000 and MB of 143. Echocardiogram performed 04/06/09 showed normal LV systolic function without segmental wall motion abnormalities Hear Myoview performed 05/23/13 for preoperative clearance revealing a small area of apical scar without ischemia. He denies chest pain or shortness of breath.

## 2015-08-29 ENCOUNTER — Other Ambulatory Visit: Payer: Self-pay | Admitting: Cardiovascular Disease

## 2015-10-03 ENCOUNTER — Other Ambulatory Visit: Payer: Self-pay | Admitting: Cardiovascular Disease

## 2015-10-03 ENCOUNTER — Encounter: Payer: Self-pay | Admitting: Family Medicine

## 2015-10-03 ENCOUNTER — Ambulatory Visit (INDEPENDENT_AMBULATORY_CARE_PROVIDER_SITE_OTHER): Payer: PRIVATE HEALTH INSURANCE | Admitting: Family Medicine

## 2015-10-03 VITALS — BP 120/80 | Temp 98.1°F | Ht 70.0 in | Wt 224.1 lb

## 2015-10-03 DIAGNOSIS — J321 Chronic frontal sinusitis: Secondary | ICD-10-CM

## 2015-10-03 MED ORDER — AMOXICILLIN-POT CLAVULANATE 875-125 MG PO TABS: 1.0000 | ORAL_TABLET | Freq: Two times a day (BID) | ORAL | 0 refills | Status: AC

## 2015-10-03 NOTE — Progress Notes (Signed)
   Subjective:    Patient ID: Connor Sanford, male    DOB: 1955/10/29, 60 y.o.   MRN: [id number]  Sinusitis  This is a new problem. The current episode started in the past 7 days. The problem is unchanged. There has been no fever. The pain is moderate. Associated symptoms include coughing and a sore throat. Treatments tried: OTC med. The treatment provided no relief.   Patient has no other concerns at this time.   Early weekk end watarrte  clms dow n in the summer+ +  Cough prod at times  gtreen disch charge this n  Slight headache little bit first of the wk    Review of Systems  HENT: Positive for sore throat.   Respiratory: Positive for cough.        Objective:   Physical Exam Alert, mild malaise. Hydration good Vitals stable. frontal/ maxillary tenderness evident positive nasal congestion. pharynx normal neck supple  lungs clear/no crackles or wheezes. heart regular in rhythm        Assessment & Plan:  Impression rhinosinusitis likely post viral, discussed with patient. plan antibiotics prescribed. Questions answered. Symptomatic care discussed. warning signs discussed. WSL

## 2015-10-03 NOTE — Telephone Encounter (Signed)
Rx(s) sent to pharmacy electronically.  

## 2015-10-09 ENCOUNTER — Other Ambulatory Visit: Payer: Self-pay | Admitting: Cardiovascular Disease

## 2015-10-31 ENCOUNTER — Ambulatory Visit (INDEPENDENT_AMBULATORY_CARE_PROVIDER_SITE_OTHER): Payer: PRIVATE HEALTH INSURANCE | Admitting: Family Medicine

## 2015-10-31 ENCOUNTER — Encounter: Payer: Self-pay | Admitting: Family Medicine

## 2015-10-31 VITALS — BP 128/76 | Temp 99.1°F | Ht 70.0 in | Wt 227.0 lb

## 2015-10-31 DIAGNOSIS — J4520 Mild intermittent asthma, uncomplicated: Secondary | ICD-10-CM | POA: Diagnosis not present

## 2015-10-31 DIAGNOSIS — J321 Chronic frontal sinusitis: Secondary | ICD-10-CM | POA: Diagnosis not present

## 2015-10-31 DIAGNOSIS — J683 Other acute and subacute respiratory conditions due to chemicals, gases, fumes and vapors: Secondary | ICD-10-CM

## 2015-10-31 MED ORDER — ALBUTEROL SULFATE HFA 108 (90 BASE) MCG/ACT IN AERS: 2.0000 | INHALATION_SPRAY | Freq: Four times a day (QID) | RESPIRATORY_TRACT | 0 refills | Status: DC | PRN

## 2015-10-31 MED ORDER — METHOCARBAMOL 500 MG PO TABS: 500.0000 mg | ORAL_TABLET | Freq: Four times a day (QID) | ORAL | 0 refills | Status: DC | PRN

## 2015-10-31 MED ORDER — PREDNISONE 20 MG PO TABS: ORAL_TABLET | ORAL | 0 refills | Status: DC

## 2015-10-31 MED ORDER — LEVOFLOXACIN 500 MG PO TABS
500.0000 mg | ORAL_TABLET | Freq: Every day | ORAL | 0 refills | Status: DC
Start: 2015-10-31 — End: 2016-07-30

## 2015-10-31 NOTE — Progress Notes (Signed)
   Subjective:    Patient ID: Connor Sanford, male    DOB: 1955-09-16, 60 y.o.   MRN: [id number]  Sinusitis  This is a new problem. Episode onset: 3 weeks. Associated symptoms include coughing, ear pain and headaches. Treatments tried: sudafed, cough meds.    Coughing a lot, some fall allergies  Has used inhaler in past. Feels tight at times.  No fever. Cough positive productive at times  Review of Systems  HENT: Positive for ear pain.   Respiratory: Positive for cough.   Neurological: Positive for headaches.       Objective:   Physical Exam Alert, mild malaise. Hydration good Vitals stable. frontal/ maxillary tenderness evident positive nasal congestion. pharynx normal neck supple  lungs clear/no crackles or wheezes. heart regular in rhythm Plus reactive airways evident during cough       Assessment & Plan:  Impression rhinosinusitis/bronchitis with element of reactive airways plan prednisone taper. Antibiotics prescribed. Inhaler when necessary WSL

## 2016-05-11 ENCOUNTER — Encounter: Payer: PRIVATE HEALTH INSURANCE | Admitting: Family Medicine

## 2016-06-12 ENCOUNTER — Other Ambulatory Visit: Payer: Self-pay | Admitting: Cardiovascular Disease

## 2016-07-20 ENCOUNTER — Other Ambulatory Visit: Payer: Self-pay | Admitting: Cardiovascular Disease

## 2016-07-30 ENCOUNTER — Encounter: Payer: Self-pay | Admitting: Cardiovascular Disease

## 2016-07-30 ENCOUNTER — Ambulatory Visit (INDEPENDENT_AMBULATORY_CARE_PROVIDER_SITE_OTHER): Payer: PRIVATE HEALTH INSURANCE | Admitting: Cardiovascular Disease

## 2016-07-30 VITALS — BP 110/70 | HR 58 | Ht 70.0 in | Wt 223.0 lb

## 2016-07-30 DIAGNOSIS — I2510 Atherosclerotic heart disease of native coronary artery without angina pectoris: Secondary | ICD-10-CM

## 2016-07-30 DIAGNOSIS — E7800 Pure hypercholesterolemia, unspecified: Secondary | ICD-10-CM | POA: Diagnosis not present

## 2016-07-30 MED ORDER — METOPROLOL TARTRATE 25 MG PO TABS: 12.5000 mg | ORAL_TABLET | Freq: Two times a day (BID) | ORAL | 10 refills | Status: DC

## 2016-07-30 MED ORDER — CLOPIDOGREL BISULFATE 75 MG PO TABS: 75.0000 mg | ORAL_TABLET | Freq: Every day | ORAL | 10 refills | Status: DC

## 2016-07-30 MED ORDER — ATORVASTATIN CALCIUM 40 MG PO TABS: 40.0000 mg | ORAL_TABLET | Freq: Every day | ORAL | 11 refills | Status: DC

## 2016-07-30 NOTE — Progress Notes (Signed)
07/30/2016 SAFIR MICHALEC   [birth year]  [id number]  Primary Physician Mikey Kirschner, MD Primary Cardiologist: [name] MD Lupe Carney, [place]  HPI:  The patient is a 61 year old mildly overweight married Caucasian male, father of 2, who retired on Jun 29 2012 from working at the [employer]. I last saw him in the office 4 07/18/15. He has a history of hyperlipidemia as well as a strong family history of heart disease and a father who had bypass surgery age 66. 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 He did have moderate inferolateral hypokinesia with an EF of 45% to 50%, with a peak CPK of 1000 and MB of 143. Echocardiogram performed April 06, 2009, showed normal LV systolic function without segmental wall motion abnormalities, and Myoview showed inferoapical scar without ischemia. He did participate in cardiac .  He did fall down in January and injured his left shoulder and now requiring rotator cuff repair. A Myoview stress test performed on 05/23/13 for preoperative clearance revealed a small area of apical scar with preserved ejection fract. Since I saw him a year ago he remained stable.   Current Outpatient Prescriptions  Medication Sig Dispense Refill  . albuterol (PROVENTIL HFA;VENTOLIN HFA) 108 (90 Base) MCG/ACT inhaler Inhale 2 puffs into the lungs every 6 (six) hours as needed for wheezing or shortness of breath. 1 Inhaler 0  . ALPRAZolam (XANAX) 1 MG tablet Take 1 tablet (1 mg total) by mouth at bedtime as needed for sleep. 30 tablet 2  . aspirin EC 81 MG tablet Take 81 mg by mouth daily.      Marland Kitchen atorvastatin (LIPITOR) 40 MG tablet Take 1 tablet (40 mg total) by mouth daily at 6 PM. 30 tablet 11  . clopidogrel (PLAVIX) 75 MG tablet Take 1 tablet (75 mg total) by mouth daily. 30 tablet 10  . diphenhydramine-acetaminophen (TYLENOL PM) 25-500 MG TABS  Take 1 tablet by mouth at bedtime as needed (for sleep). Reported on 05/01/2015    . fexofenadine-pseudoephedrine (ALLEGRA-D) 60-120 MG per tablet Take 1 tablet by mouth 2 (two) times daily.    . metoprolol tartrate (LOPRESSOR) 25 MG tablet Take 0.5 tablets (12.5 mg total) by mouth 2 (two) times daily. 30 tablet 10  . Multiple Vitamins-Minerals (MULTIVITAMIN WITH IRON-MINERALS) liquid Take 30 mLs by mouth daily.      . nitroGLYCERIN (NITROSTAT) 0.4 MG SL tablet Place 1 tablet (0.4 mg total) under the tongue every 5 (five) minutes as needed for chest pain. 25 tablet 3   No current facility-administered medications for this visit.     No Known Allergies  Social History   Social History  . Marital status: Married    Spouse name: N/A  . Number of children: N/A  . Years of education: N/A   Occupational History  . Not on file.   Social History Main Topics  . Smoking status: Never Smoker  . Smokeless tobacco: Never Used  . Alcohol use No  . Drug use: No  . Sexual activity: Yes    Birth control/ protection: None   Other Topics Concern  . Not on file   Social History Narrative  . No narrative on file     Review of Systems: General: negative for chills, fever, night sweats or weight changes.  Cardiovascular: negative for chest pain, dyspnea on exertion, edema, orthopnea, palpitations, paroxysmal nocturnal dyspnea  or shortness of breath Dermatological: negative for rash Respiratory: negative for cough or wheezing Urologic: negative for hematuria Abdominal: negative for nausea, vomiting, diarrhea, bright red blood per rectum, melena, or hematemesis Neurologic: negative for visual changes, syncope, or dizziness All other systems reviewed and are otherwise negative except as noted above.    Blood pressure 110/70, pulse (!) 58, height 5\' 10"  (1.778 m), weight 223 lb (101.2 kg), SpO2 98 %.  General appearance: alert and no distress Neck: no adenopathy, no carotid bruit, no JVD,  supple, symmetrical, trachea midline and thyroid not enlarged, symmetric, no tenderness/mass/nodules Lungs: clear to auscultation bilaterally Heart: regular rate and rhythm, S1, S2 normal, no murmur, click, rub or gallop Extremities: extremities normal, atraumatic, no cyanosis or edema  EKG sinus rhythm at 64 without ST or T-wave changes. I personally reviewed this EKG  ASSESSMENT AND PLAN:   Atherosclerotic heart disease History of CAD status post acute myocardial infarction 01/27/09 secondary to an occluded large second diagonal branch which I stented using 2 overlapping bare metal stents. He did have moderate inferolateral hypokinesia with EF of 45-50%. His peak CPK was felt MB of 143. Echo performed 2/6//11 revealed normalization of his LV function without segmental wall motion abdomen obese. The Myoview showed inferoapical scar without ischemia. Myoview performed 05/23/13 for preoperative clearance showed a small area of apical scar with preserved ejection fraction and no ischemia. He denies chest pain or shortness of breath.  Hyperlipidemia History of hyperlipidemia on statin therapy followed by his PCP      [name] MD [facility], [facility] 07/30/2016 9:19 AM

## 2016-07-30 NOTE — Assessment & Plan Note (Signed)
History of hyperlipidemia on statin therapy followed by his PCP 

## 2016-07-30 NOTE — Patient Instructions (Signed)

## 2016-07-30 NOTE — Assessment & Plan Note (Signed)
History of CAD status post acute myocardial infarction 01/27/09 secondary to an occluded large second diagonal branch which I stented using 2 overlapping bare metal stents. He did have moderate inferolateral hypokinesia with EF of 45-50%. His peak CPK was felt MB of 143. Echo performed 2/6//11 revealed normalization of his LV function without segmental wall motion abdomen obese. The Myoview showed inferoapical scar without ischemia. Myoview performed 05/23/13 for preoperative clearance showed a small area of apical scar with preserved ejection fraction and no ischemia. He denies chest pain or shortness of breath.

## 2016-09-27 ENCOUNTER — Other Ambulatory Visit: Payer: Self-pay | Admitting: *Deleted

## 2016-09-27 DIAGNOSIS — I2510 Atherosclerotic heart disease of native coronary artery without angina pectoris: Secondary | ICD-10-CM

## 2016-09-27 MED ORDER — ATORVASTATIN CALCIUM 40 MG PO TABS: 40.0000 mg | ORAL_TABLET | Freq: Every day | ORAL | 3 refills | Status: DC

## 2016-10-27 ENCOUNTER — Other Ambulatory Visit: Payer: Self-pay | Admitting: *Deleted

## 2016-10-27 ENCOUNTER — Other Ambulatory Visit: Payer: Self-pay

## 2016-10-27 DIAGNOSIS — I2510 Atherosclerotic heart disease of native coronary artery without angina pectoris: Secondary | ICD-10-CM

## 2016-10-27 MED ORDER — CLOPIDOGREL BISULFATE 75 MG PO TABS: 75.0000 mg | ORAL_TABLET | Freq: Every day | ORAL | 3 refills | Status: DC

## 2016-11-11 ENCOUNTER — Telehealth: Payer: Self-pay | Admitting: Family Medicine

## 2016-11-11 DIAGNOSIS — Z0000 Encounter for general adult medical examination without abnormal findings: Secondary | ICD-10-CM

## 2016-11-11 NOTE — Telephone Encounter (Signed)
Spoke with patient and informed him per Dr.Scott Luking- Labs have been ordered. Patient verbalized understanding.  

## 2016-11-11 NOTE — Telephone Encounter (Signed)
Lip liv m7 PSA

## 2016-11-11 NOTE — Telephone Encounter (Signed)
Patient has physical 10/2 and needing labs done.

## 2016-11-24 LAB — BASIC METABOLIC PANEL
BUN/Creatinine Ratio: 16 (ref 10–24)
BUN: 16 mg/dL (ref 8–27)
CO2: 26 mmol/L (ref 20–29)
CREATININE: 1.03 mg/dL (ref 0.76–1.27)
Calcium: 9.5 mg/dL (ref 8.6–10.2)
Chloride: 102 mmol/L (ref 96–106)
GFR, EST AFRICAN AMERICAN: 91 mL/min/{1.73_m2} (ref >59)
GFR, EST NON AFRICAN AMERICAN: 79 mL/min/{1.73_m2} (ref >59)
GLUCOSE: 99 mg/dL (ref 65–99)
POTASSIUM: 4.9 mmol/L (ref 3.5–5.2)
Sodium: 141 mmol/L (ref 134–144)

## 2016-11-24 LAB — LIPID PANEL
Chol/HDL Ratio: 2.9 ratio (ref 0.0–5.0)
Cholesterol, Total: 115 mg/dL (ref 100–199)
HDL: 40 mg/dL (ref >39)
LDL Calculated: 61 mg/dL (ref 0–99)
Triglycerides: 70 mg/dL (ref 0–149)
VLDL Cholesterol Cal: 14 mg/dL (ref 5–40)

## 2016-11-24 LAB — HEPATIC FUNCTION PANEL
ALT: 20 IU/L (ref 0–44)
AST: 17 IU/L (ref 0–40)
Albumin: 4.7 g/dL (ref 3.6–4.8)
Alkaline Phosphatase: 96 IU/L (ref 39–117)
BILIRUBIN, DIRECT: 0.2 mg/dL (ref 0.00–0.40)
Bilirubin Total: 0.7 mg/dL (ref 0.0–1.2)
Total Protein: 6.7 g/dL (ref 6.0–8.5)

## 2016-11-24 LAB — PSA: Prostate Specific Ag, Serum: 0.6 ng/mL (ref 0.0–4.0)

## 2016-11-30 ENCOUNTER — Encounter: Payer: Self-pay | Admitting: Family Medicine

## 2016-11-30 ENCOUNTER — Ambulatory Visit (INDEPENDENT_AMBULATORY_CARE_PROVIDER_SITE_OTHER): Payer: PRIVATE HEALTH INSURANCE | Admitting: Family Medicine

## 2016-11-30 VITALS — BP 110/68 | Ht 70.0 in | Wt 219.0 lb

## 2016-11-30 DIAGNOSIS — Z23 Encounter for immunization: Secondary | ICD-10-CM

## 2016-11-30 DIAGNOSIS — Z0000 Encounter for general adult medical examination without abnormal findings: Secondary | ICD-10-CM

## 2016-11-30 MED ORDER — ALPRAZOLAM 1 MG PO TABS: 1.0000 mg | ORAL_TABLET | Freq: Every evening | ORAL | 2 refills | Status: DC | PRN

## 2016-11-30 MED ORDER — NITROGLYCERIN 0.4 MG SL SUBL: 0.4000 mg | SUBLINGUAL_TABLET | SUBLINGUAL | 2 refills | Status: DC | PRN

## 2016-11-30 NOTE — Patient Instructions (Addendum)
Results for orders placed or performed in visit on 65/79/03  Basic metabolic panel  Result Value Ref Range   Glucose 99 65 - 99 mg/dL   BUN 16 8 - 27 mg/dL   Creatinine, Ser 1.03 0.76 - 1.27 mg/dL   GFR calc non Af Amer 79 >59 mL/min/1.73   GFR calc Af Amer 91 >59 mL/min/1.73   BUN/Creatinine Ratio 16 10 - 24   Sodium 141 134 - 144 mmol/L   Potassium 4.9 3.5 - 5.2 mmol/L   Chloride 102 96 - 106 mmol/L   CO2 26 20 - 29 mmol/L   Calcium 9.5 8.6 - 10.2 mg/dL  Hepatic function panel  Result Value Ref Range   Total Protein 6.7 6.0 - 8.5 g/dL   Albumin 4.7 3.6 - 4.8 g/dL   Bilirubin Total 0.7 0.0 - 1.2 mg/dL   Bilirubin, Direct 0.20 0.00 - 0.40 mg/dL   Alkaline Phosphatase 96 39 - 117 IU/L   AST 17 0 - 40 IU/L   ALT 20 0 - 44 IU/L  Lipid panel  Result Value Ref Range   Cholesterol, Total 115 100 - 199 mg/dL   Triglycerides 70 0 - 149 mg/dL   HDL 40 >39 mg/dL   VLDL Cholesterol Cal 14 5 - 40 mg/dL   LDL Calculated 61 0 - 99 mg/dL   Chol/HDL Ratio 2.9 0.0 - 5.0 ratio  PSA  Result Value Ref Range   Prostate Specific Ag, Serum 0.6 0.0 - 4.0 ng/mL

## 2016-11-30 NOTE — Progress Notes (Signed)
Subjective:    Patient ID: Connor Sanford, male    DOB: [birth year], 61 y.o.   MRN: [id number]  HPI  The patient comes in today for a wellness visit.  Exercise reg reg walking, soe extra lately     A review of their health history was completed.  A review of medications was also completed.  Any needed refills; Xanax, and Nitroglycerin  Eating habits:  Patient states eating habits are pretty good.   Falls/  MVA accidents in past few months: None   Regular exercise: Patient states exercises regularly. Walks 3-4 times per day.   Specialist pt sees on regular basis: Cardiologist (Dr.Berry)  Diet good but not perfect  .this Results for orders placed or performed in visit on 93/71/69  Basic metabolic panel  Result Value Ref Range   Glucose 99 65 - 99 mg/dL   BUN 16 8 - 27 mg/dL   Creatinine, Ser 1.03 0.76 - 1.27 mg/dL   GFR calc non Af Amer 79 >59 mL/min/1.73   GFR calc Af Amer 91 >59 mL/min/1.73   BUN/Creatinine Ratio 16 10 - 24   Sodium 141 134 - 144 mmol/L   Potassium 4.9 3.5 - 5.2 mmol/L   Chloride 102 96 - 106 mmol/L   CO2 26 20 - 29 mmol/L   Calcium 9.5 8.6 - 10.2 mg/dL  Hepatic function panel  Result Value Ref Range   Total Protein 6.7 6.0 - 8.5 g/dL   Albumin 4.7 3.6 - 4.8 g/dL   Bilirubin Total 0.7 0.0 - 1.2 mg/dL   Bilirubin, Direct 0.20 0.00 - 0.40 mg/dL   Alkaline Phosphatase 96 39 - 117 IU/L   AST 17 0 - 40 IU/L   ALT 20 0 - 44 IU/L  Lipid panel  Result Value Ref Range   Cholesterol, Total 115 100 - 199 mg/dL   Triglycerides 70 0 - 149 mg/dL   HDL 40 >39 mg/dL   VLDL Cholesterol Cal 14 5 - 40 mg/dL   LDL Calculated 61 0 - 99 mg/dL   Chol/HDL Ratio 2.9 0.0 - 5.0 ratio  PSA  Result Value Ref Range   Prostate Specific Ag, Serum 0.6 0.0 - 4.0 ng/mL    colonsocpy done 2014 due  2021  Flu shot will do today      Preventative health issues were discussed.   Additional concerns: Patient states no other concerns this visit.  Review of  Systems  Constitutional: Negative for activity change, appetite change and fever.  HENT: Negative for congestion and rhinorrhea.   Eyes: Negative for discharge.  Respiratory: Negative for cough and wheezing.   Cardiovascular: Negative for chest pain.  Gastrointestinal: Negative for abdominal pain, blood in stool and vomiting.  Genitourinary: Negative for difficulty urinating and frequency.  Musculoskeletal: Negative for neck pain.  Skin: Negative for rash.  Allergic/Immunologic: Negative for environmental allergies and food allergies.  Neurological: Negative for weakness and headaches.  Psychiatric/Behavioral: Negative for agitation.  All other systems reviewed and are negative.      Objective:   Physical Exam  Constitutional: He appears well-developed and well-nourished.  HENT:  Head: Normocephalic and atraumatic.  Right Ear: External ear normal.  Left Ear: External ear normal.  Nose: Nose normal.  Mouth/Throat: Oropharynx is clear and moist.  Eyes: Pupils are equal, round, and reactive to light. EOM are normal.  Neck: Normal range of motion. Neck supple. No thyromegaly present.  Cardiovascular: Normal rate, regular rhythm and normal heart sounds.   No  murmur heard. Pulmonary/Chest: Effort normal and breath sounds normal. No respiratory distress. He has no wheezes.  Abdominal: Soft. Bowel sounds are normal. He exhibits no distension and no mass. There is no tenderness.  Genitourinary: Penis normal.  Musculoskeletal: Normal range of motion. He exhibits no edema.  Lymphadenopathy:    He has no cervical adenopathy.  Neurological: He is alert. He exhibits normal muscle tone.  Skin: Skin is warm and dry. No erythema.  Psychiatric: He has a normal mood and affect. His behavior is normal. Judgment normal.  Vitals reviewed.         Assessment & Plan:  mpression 1 well adult exam up to date on colonoscopy.Diet discussed. Exercise discussed #2 coronary artery disease. Followed  by specialist #3 hyperlipidemia managed by specialist plan nitroglycerin refilled at patient request. Xanax refilled for rare use.blood work discussed/flu shot today

## 2017-01-19 ENCOUNTER — Encounter: Payer: Self-pay | Admitting: Family Medicine

## 2017-01-19 ENCOUNTER — Ambulatory Visit: Payer: PRIVATE HEALTH INSURANCE | Admitting: Family Medicine

## 2017-01-19 ENCOUNTER — Telehealth: Payer: Self-pay | Admitting: Family Medicine

## 2017-01-19 VITALS — BP 126/70 | Ht 70.0 in | Wt 224.0 lb

## 2017-01-19 DIAGNOSIS — M7062 Trochanteric bursitis, left hip: Secondary | ICD-10-CM

## 2017-01-19 MED ORDER — PREDNISONE 20 MG PO TABS: ORAL_TABLET | ORAL | 0 refills | Status: DC

## 2017-01-19 NOTE — Progress Notes (Signed)
   Subjective:    Patient ID: Connor Sanford, male    DOB: February 25, 1956, 61 y.o.   MRN: [id number]  Hip Pain   Incident onset: one week. There was no injury mechanism. The pain is present in the left hip and left leg. Treatments tried: aleve, advil, heating pad.    Dull pain in hip worsening progressibely, recalls no sudden injury.  The pain in the region of the posterior left buttock.  Radiates down to lateral hip and mid thigh.  Not below the knee.  No weakness in the leg.  No overuse  ibu helping some  Pos pain and hurting     Review of Systems No headache, no major weight loss or weight gain, no chest pain no back pain abdominal pain no change in bowel habits complete ROS otherwise negative     Objective:   Physical Exam Alert vitals stable, NAD. Blood pressure good on repeat. HEENT normal. Lungs clear. Heart regular rate and rhythm. Negative true sciatica left side.  Negative sciatic notch tenderness some deep tenderness palpation lateral hip positive pain with internal rotation       Assessment & Plan:  Impression hip bursitis.  Plan local measures discussed.  Prednisone taper.  Hold off on skin-normal

## 2017-01-19 NOTE — Telephone Encounter (Signed)
Pt having left hip pain. Radiates down left leg. No injury. Pt made appt today

## 2017-01-19 NOTE — Telephone Encounter (Signed)
Pt is having hip pain for the past couple days that he hasnt had in the past. Pt is either wanting to be seen today or Friday or have something called in.

## 2017-06-29 ENCOUNTER — Other Ambulatory Visit: Payer: Self-pay | Admitting: *Deleted

## 2017-06-29 ENCOUNTER — Telehealth: Payer: Self-pay | Admitting: Family Medicine

## 2017-06-29 MED ORDER — PREDNISONE 20 MG PO TABS: ORAL_TABLET | ORAL | 0 refills | Status: DC

## 2017-06-29 NOTE — Telephone Encounter (Signed)
Patient has poison oak on his back near his waist  And requesting prednisone can you call into [employer] [facility]

## 2017-06-29 NOTE — Telephone Encounter (Signed)
Adult pred taper 

## 2017-06-29 NOTE — Telephone Encounter (Signed)
Med sent to pharm. Pt notified.  

## 2017-08-31 ENCOUNTER — Telehealth: Payer: Self-pay | Admitting: Family Medicine

## 2017-08-31 MED ORDER — PREDNISONE 20 MG PO TABS
ORAL_TABLET | ORAL | 0 refills | Status: DC
Start: 2017-08-31 — End: 2017-11-30

## 2017-08-31 NOTE — Telephone Encounter (Signed)
Pt is calling in for a refill on predniSONE (DELTASONE) 20 MG tablet. He believes his sciatic nerve is acting up again and he is at work and cant come in today and is aware the office is closed tomorrow. He is hoping this could be called in today to [place], [place] would like to be called if this is able to be called in today.

## 2017-08-31 NOTE — Telephone Encounter (Signed)
Pt notified and med sent in to pharmacy

## 2017-08-31 NOTE — Telephone Encounter (Signed)
Adult pred taper 

## 2017-11-01 ENCOUNTER — Telehealth: Payer: Self-pay | Admitting: Cardiovascular Disease

## 2017-11-01 DIAGNOSIS — I2510 Atherosclerotic heart disease of native coronary artery without angina pectoris: Secondary | ICD-10-CM

## 2017-11-01 MED ORDER — METOPROLOL TARTRATE 25 MG PO TABS
12.5000 mg | ORAL_TABLET | Freq: Two times a day (BID) | ORAL | 3 refills | Status: DC
Start: 2017-11-01 — End: 2019-02-26

## 2017-11-01 MED ORDER — ATORVASTATIN CALCIUM 40 MG PO TABS: 40.0000 mg | ORAL_TABLET | Freq: Every day | ORAL | 3 refills | Status: DC

## 2017-11-01 MED ORDER — CLOPIDOGREL BISULFATE 75 MG PO TABS: 75.0000 mg | ORAL_TABLET | Freq: Every day | ORAL | 3 refills | Status: DC

## 2017-11-01 NOTE — Telephone Encounter (Signed)
Refill sent to the pharmacy electronically.  

## 2017-11-01 NOTE — Telephone Encounter (Signed)
New Message:    *STAT* If patient is at the pharmacy, call can be transferred to refill team.   1. Which medications need to be refilled? (please list name of each medication and dose if known)   metoprolol tartrate (LOPRESSOR) 25 MG tablet  atorvastatin (LIPITOR) 40 MG tablet  clopidogrel (PLAVIX) 75 MG tablet  2. Which pharmacy/location (including street and city if local pharmacy) is medication to be sent to? [place]  3. Do they need a 30 day or 90 day supply? 90 Days

## 2017-11-30 ENCOUNTER — Encounter: Payer: Self-pay | Admitting: Cardiovascular Disease

## 2017-11-30 ENCOUNTER — Ambulatory Visit (INDEPENDENT_AMBULATORY_CARE_PROVIDER_SITE_OTHER): Payer: PRIVATE HEALTH INSURANCE | Admitting: Cardiovascular Disease

## 2017-11-30 VITALS — BP 120/74 | HR 59 | Ht 70.0 in | Wt 229.6 lb

## 2017-11-30 DIAGNOSIS — I2510 Atherosclerotic heart disease of native coronary artery without angina pectoris: Secondary | ICD-10-CM

## 2017-11-30 DIAGNOSIS — E7800 Pure hypercholesterolemia, unspecified: Secondary | ICD-10-CM | POA: Diagnosis not present

## 2017-11-30 NOTE — Progress Notes (Signed)
11/30/2017 Connor Sanford   November 29, 1955  [id number]  Primary Physician Mikey Kirschner, MD Primary Cardiologist: [name] MD Connor Sanford, [place], [place]  HPI:  Connor Sanford is a 62 y.o.  mildly overweight married Caucasian male, father of 2, who retired on Jun 29 2012 from working at the [employer]. I last saw him in the office 07/30/2016.  He has a history of hyperlipidemia as well as a strong family history of heart disease and a father who had bypass surgery age 20. 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 He did have moderate inferolateral hypokinesia with an EF of 45% to 50%, with a peak CPK of 1000 and MB of 143. Echocardiogram performed April 06, 2009, showed normal LV systolic function without segmental wall motion abnormalities, and Myoview showed inferoapical scar without ischemia. He did participate in cardiac .  He did fall down in January and injured his left shoulder and now requiring rotator cuff repair. A Myoview stress test performed on 05/23/13 for preoperative clearance revealed a small area of apical scar with preserved ejection fract. Since I saw him a year ago he remained stable.   Current Meds  Medication Sig  . albuterol (PROVENTIL HFA;VENTOLIN HFA) 108 (90 Base) MCG/ACT inhaler Inhale 2 puffs into the lungs every 6 (six) hours as needed for wheezing or shortness of breath.  . ALPRAZolam (XANAX) 1 MG tablet Take 1 tablet (1 mg total) by mouth at bedtime as needed for sleep.  Marland Kitchen aspirin EC 81 MG tablet Take 81 mg by mouth daily.    Marland Kitchen atorvastatin (LIPITOR) 40 MG tablet Take 1 tablet (40 mg total) by mouth daily at 6 PM.  . clopidogrel (PLAVIX) 75 MG tablet Take 1 tablet (75 mg total) by mouth daily.  . diphenhydramine-acetaminophen (TYLENOL PM) 25-500 MG TABS Take 1 tablet by mouth at bedtime as needed (for sleep). Reported on 05/01/2015  .  fexofenadine-pseudoephedrine (ALLEGRA-D) 60-120 MG per tablet Take 1 tablet by mouth 2 (two) times daily.  . metoprolol tartrate (LOPRESSOR) 25 MG tablet Take 0.5 tablets (12.5 mg total) by mouth 2 (two) times daily.  . Multiple Vitamins-Minerals (MULTIVITAMIN WITH IRON-MINERALS) liquid Take 30 mLs by mouth daily.    . nitroGLYCERIN (NITROSTAT) 0.4 MG SL tablet Place 1 tablet (0.4 mg total) under the tongue every 5 (five) minutes as needed for chest pain.  . [DISCONTINUED] predniSONE (DELTASONE) 20 MG tablet Three qd for three d, two qd for three d, one qd for two d  . [DISCONTINUED] predniSONE (DELTASONE) 20 MG tablet Take 3 qd for 3 days, then 2 qd for 3 days, then one qd for 3 days     No Known Allergies  Social History   Socioeconomic History  . Marital status: Married    Spouse name: Not on file  . Number of children: Not on file  . Years of education: Not on file  . Highest education level: Not on file  Occupational History  . Not on file  Social Needs  . Financial resource strain: Not on file  . Food insecurity:    Worry: Not on file    Inability: Not on file  . Transportation needs:    Medical: Not on file    Non-medical: Not on file  Tobacco Use  . Smoking status: Never Smoker  . Smokeless tobacco: Never Used  Substance and Sexual  Activity  . Alcohol use: No  . Drug use: No  . Sexual activity: Yes    Birth control/protection: None  Lifestyle  . Physical activity:    Days per week: Not on file    Minutes per session: Not on file  . Stress: Not on file  Relationships  . Social connections:    Talks on phone: Not on file    Gets together: Not on file    Attends religious service: Not on file    Active member of club or organization: Not on file    Attends meetings of clubs or organizations: Not on file    Relationship status: Not on file  . Intimate partner violence:    Fear of current or ex partner: Not on file    Emotionally abused: Not on file     Physically abused: Not on file    Forced sexual activity: Not on file  Other Topics Concern  . Not on file  Social History Narrative  . Not on file     Review of Systems: General: negative for chills, fever, night sweats or weight changes.  Cardiovascular: negative for chest pain, dyspnea on exertion, edema, orthopnea, palpitations, paroxysmal nocturnal dyspnea or shortness of breath Dermatological: negative for rash Respiratory: negative for cough or wheezing Urologic: negative for hematuria Abdominal: negative for nausea, vomiting, diarrhea, bright red blood per rectum, melena, or hematemesis Neurologic: negative for visual changes, syncope, or dizziness All other systems reviewed and are otherwise negative except as noted above.    Blood pressure 120/74, pulse (!) 59, height 5\' 10"  (1.778 m), weight 229 lb 9.6 oz (104.1 kg).  General appearance: alert and no distress Neck: no adenopathy, no carotid bruit, no JVD, supple, symmetrical, trachea midline and thyroid not enlarged, symmetric, no tenderness/mass/nodules Lungs: clear to auscultation bilaterally Heart: regular rate and rhythm, S1, S2 normal, no murmur, click, rub or gallop Extremities: extremities normal, atraumatic, no cyanosis or edema Pulses: 2+ and symmetric Skin: Skin color, texture, turgor normal. No rashes or lesions Neurologic: Alert and oriented X 3, normal strength and tone. Normal symmetric reflexes. Normal coordination and gait  EKG sinus bradycardia 59 without ST or T wave changes.  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  I personally reviewed this EKG.  ASSESSMENT AND PLAN:   Hyperlipidemia History of hyperlipidemia on statin therapy with lipid profile performed 11/23/2016 revealing total cholesterol 115, LDL 61 and HDL 40  Atherosclerotic heart disease History of CAD status post myocardial infarction 01/27/2009 secondary to an occluded large second diagonal branch which I stented using 2 overlapping  bare-metal stents.  He did have moderate inferolateral hypokinesia with an EF of 45 to 50% with a peak CPK of 1000 and MB of 143.  2D echo performed 04/06/2009 showed normal LV systolic function without segmental wall motion and normalities Myoview done for preoperative clearance 05/20/2013 was low risk and nonischemic.  He denies chest pain or shortness of breath.      [name] MD FACP,FACC,FAHA, [facility] 11/30/2017 10:44 AM

## 2017-11-30 NOTE — Assessment & Plan Note (Signed)
History of hyperlipidemia on statin therapy with lipid profile performed 11/23/2016 revealing total cholesterol 115, LDL 61 and HDL 40

## 2017-11-30 NOTE — Assessment & Plan Note (Signed)
History of CAD status post myocardial infarction 01/27/2009 secondary to an occluded large second diagonal branch which I stented using 2 overlapping bare-metal stents.  He did have moderate inferolateral hypokinesia with an EF of 45 to 50% with a peak CPK of 1000 and MB of 143.  2D echo performed 04/06/2009 showed normal LV systolic function without segmental wall motion and normalities Myoview done for preoperative clearance 05/20/2013 was low risk and nonischemic.  He denies chest pain or shortness of breath.

## 2017-11-30 NOTE — Patient Instructions (Addendum)

## 2017-12-08 ENCOUNTER — Ambulatory Visit (INDEPENDENT_AMBULATORY_CARE_PROVIDER_SITE_OTHER): Payer: PRIVATE HEALTH INSURANCE | Admitting: Family Medicine

## 2017-12-08 ENCOUNTER — Encounter: Payer: Self-pay | Admitting: Family Medicine

## 2017-12-08 VITALS — BP 122/82 | Ht 70.0 in | Wt 230.0 lb

## 2017-12-08 DIAGNOSIS — Z79899 Other long term (current) drug therapy: Secondary | ICD-10-CM

## 2017-12-08 DIAGNOSIS — Z125 Encounter for screening for malignant neoplasm of prostate: Secondary | ICD-10-CM | POA: Diagnosis not present

## 2017-12-08 DIAGNOSIS — Z23 Encounter for immunization: Secondary | ICD-10-CM | POA: Diagnosis not present

## 2017-12-08 DIAGNOSIS — Z13220 Encounter for screening for lipoid disorders: Secondary | ICD-10-CM | POA: Diagnosis not present

## 2017-12-08 DIAGNOSIS — Z0000 Encounter for general adult medical examination without abnormal findings: Secondary | ICD-10-CM | POA: Diagnosis not present

## 2017-12-08 MED ORDER — ALPRAZOLAM 1 MG PO TABS: 1.0000 mg | ORAL_TABLET | Freq: Every evening | ORAL | 5 refills | Status: DC | PRN

## 2017-12-08 NOTE — Progress Notes (Signed)
Subjective:    Patient ID: Connor Sanford, male    DOB: 09/08/1955, 62 y.o.   MRN: [id number]  HPI  The patient comes in today for a wellness visit.    A review of their health history was completed.  A review of medications was also completed.  Any needed refills; no- heart dr gave refills  Eating habits: trying to   Falls/  MVA accidents in past few months: no  Regular exercise: yes  Specialist pt sees on regular basis: heart dr  Preventative health issues were discussed.   Additional concerns: none   Exercising 20 min per day, trying to lose  Self grades diet good these days, has improved since heart attack   Not a big veggie gyuyg    Results for orders placed or performed in visit on 03/09/30  Basic metabolic panel  Result Value Ref Range   Glucose 99 65 - 99 mg/dL   BUN 16 8 - 27 mg/dL   Creatinine, Ser 1.03 0.76 - 1.27 mg/dL   GFR calc non Af Amer 79 >59 mL/min/1.73   GFR calc Af Amer 91 >59 mL/min/1.73   BUN/Creatinine Ratio 16 10 - 24   Sodium 141 134 - 144 mmol/L   Potassium 4.9 3.5 - 5.2 mmol/L   Chloride 102 96 - 106 mmol/L   CO2 26 20 - 29 mmol/L   Calcium 9.5 8.6 - 10.2 mg/dL  Hepatic function panel  Result Value Ref Range   Total Protein 6.7 6.0 - 8.5 g/dL   Albumin 4.7 3.6 - 4.8 g/dL   Bilirubin Total 0.7 0.0 - 1.2 mg/dL   Bilirubin, Direct 0.20 0.00 - 0.40 mg/dL   Alkaline Phosphatase 96 39 - 117 IU/L   AST 17 0 - 40 IU/L   ALT 20 0 - 44 IU/L  Lipid panel  Result Value Ref Range   Cholesterol, Total 115 100 - 199 mg/dL   Triglycerides 70 0 - 149 mg/dL   HDL 40 >39 mg/dL   VLDL Cholesterol Cal 14 5 - 40 mg/dL   LDL Calculated 61 0 - 99 mg/dL   Chol/HDL Ratio 2.9 0.0 - 5.0 ratio  PSA  Result Value Ref Range   Prostate Specific Ag, Serum 0.6 0.0 - 4.0 ng/mL      Review of Systems  Constitutional: Negative for activity change, appetite change and fever.  HENT: Negative for congestion and rhinorrhea.   Eyes: Negative for  discharge.  Respiratory: Negative for cough and wheezing.   Cardiovascular: Negative for chest pain.  Gastrointestinal: Negative for abdominal pain, blood in stool and vomiting.  Genitourinary: Negative for difficulty urinating and frequency.  Musculoskeletal: Negative for neck pain.  Skin: Negative for rash.  Allergic/Immunologic: Negative for environmental allergies and food allergies.  Neurological: Negative for weakness and headaches.  Psychiatric/Behavioral: Negative for agitation.  All other systems reviewed and are negative.      Objective:   Physical Exam  Constitutional: He appears well-developed and well-nourished.  HENT:  Head: Normocephalic and atraumatic.  Right Ear: External ear normal.  Left Ear: External ear normal.  Nose: Nose normal.  Mouth/Throat: Oropharynx is clear and moist.  Eyes: Pupils are equal, round, and reactive to light. EOM are normal.  Neck: Normal range of motion. Neck supple. No thyromegaly present.  Cardiovascular: Normal rate, regular rhythm and normal heart sounds.  No murmur heard. Pulmonary/Chest: Effort normal and breath sounds normal. No respiratory distress. He has no wheezes.  Abdominal: Soft. Bowel sounds  are normal. He exhibits no distension and no mass. There is no tenderness.  Genitourinary: Penis normal.  Musculoskeletal: Normal range of motion. He exhibits no edema.  Lymphadenopathy:    He has no cervical adenopathy.  Neurological: He is alert. He exhibits normal muscle tone.  Skin: Skin is warm and dry. No erythema.  Psychiatric: He has a normal mood and affect. His behavior is normal. Judgment normal.  Vitals reviewed.         Assessment & Plan:  Impression wellness exam.  Diet discussed.  Exercise discussed.  Vaccines discussed.  Shingles shot last year flu shot today.  Colonoacopy done 2014, next due 2021 because of sm tub ademom    Coronary artery disease followed by specialist  Hyperlipidemia.  Blood work Managed  by [place].  LDL goal 70 or less rationale discussed.  Check appropriate blood work further recommendations based on results  Insomnia with intermittent use of alprazolam uses a couple 3 times per week medications refilled  Follow-up yearly

## 2017-12-14 LAB — BASIC METABOLIC PANEL
BUN/Creatinine Ratio: 13 (ref 10–24)
BUN: 14 mg/dL (ref 8–27)
CO2: 23 mmol/L (ref 20–29)
Calcium: 9.2 mg/dL (ref 8.6–10.2)
Chloride: 103 mmol/L (ref 96–106)
Creatinine, Ser: 1.09 mg/dL (ref 0.76–1.27)
GFR, EST AFRICAN AMERICAN: 84 mL/min/{1.73_m2} (ref >59)
GFR, EST NON AFRICAN AMERICAN: 73 mL/min/{1.73_m2} (ref >59)
Glucose: 114 mg/dL — ABNORMAL HIGH (ref 65–99)
POTASSIUM: 4.7 mmol/L (ref 3.5–5.2)
SODIUM: 142 mmol/L (ref 134–144)

## 2017-12-14 LAB — LIPID PANEL
CHOLESTEROL TOTAL: 122 mg/dL (ref 100–199)
Chol/HDL Ratio: 3.4 ratio (ref 0.0–5.0)
HDL: 36 mg/dL — ABNORMAL LOW (ref >39)
LDL Calculated: 68 mg/dL (ref 0–99)
Triglycerides: 88 mg/dL (ref 0–149)
VLDL Cholesterol Cal: 18 mg/dL (ref 5–40)

## 2017-12-14 LAB — HEPATIC FUNCTION PANEL
ALBUMIN: 4.4 g/dL (ref 3.6–4.8)
ALK PHOS: 101 IU/L (ref 39–117)
ALT: 26 IU/L (ref 0–44)
AST: 17 IU/L (ref 0–40)
BILIRUBIN TOTAL: 0.6 mg/dL (ref 0.0–1.2)
Bilirubin, Direct: 0.18 mg/dL (ref 0.00–0.40)
Total Protein: 6.2 g/dL (ref 6.0–8.5)

## 2017-12-14 LAB — PSA: Prostate Specific Ag, Serum: 0.5 ng/mL (ref 0.0–4.0)

## 2017-12-21 ENCOUNTER — Encounter: Payer: Self-pay | Admitting: Family Medicine

## 2018-06-27 ENCOUNTER — Other Ambulatory Visit: Payer: Self-pay | Admitting: Family Medicine

## 2018-06-30 NOTE — Telephone Encounter (Signed)
Six mo worth 

## 2018-12-08 ENCOUNTER — Telehealth: Payer: Self-pay | Admitting: Family Medicine

## 2018-12-08 DIAGNOSIS — Z79899 Other long term (current) drug therapy: Secondary | ICD-10-CM

## 2018-12-08 DIAGNOSIS — Z125 Encounter for screening for malignant neoplasm of prostate: Secondary | ICD-10-CM

## 2018-12-08 DIAGNOSIS — E7800 Pure hypercholesterolemia, unspecified: Secondary | ICD-10-CM

## 2018-12-08 DIAGNOSIS — Z13220 Encounter for screening for lipoid disorders: Secondary | ICD-10-CM

## 2018-12-08 NOTE — Telephone Encounter (Signed)
Last labs 11/2017: Lipid, Liver, Met 7 and PSA

## 2018-12-08 NOTE — Telephone Encounter (Signed)
Pt has physical 01/09/2019 - would like labs ordered  Please advise & call pt

## 2018-12-10 NOTE — Telephone Encounter (Signed)
Rep same 

## 2018-12-11 NOTE — Telephone Encounter (Signed)
Lab orders placed. Left message on pt voicemail that labs have been put in

## 2018-12-14 ENCOUNTER — Other Ambulatory Visit: Payer: Self-pay | Admitting: Cardiovascular Disease

## 2018-12-14 DIAGNOSIS — I2510 Atherosclerotic heart disease of native coronary artery without angina pectoris: Secondary | ICD-10-CM

## 2019-01-03 LAB — BASIC METABOLIC PANEL
BUN/Creatinine Ratio: 16 (ref 10–24)
BUN: 18 mg/dL (ref 8–27)
CO2: 21 mmol/L (ref 20–29)
Calcium: 9.4 mg/dL (ref 8.6–10.2)
Chloride: 102 mmol/L (ref 96–106)
Creatinine, Ser: 1.14 mg/dL (ref 0.76–1.27)
GFR calc Af Amer: 79 mL/min/{1.73_m2} (ref >59)
GFR calc non Af Amer: 69 mL/min/{1.73_m2} (ref >59)
Glucose: 112 mg/dL — ABNORMAL HIGH (ref 65–99)
Potassium: 4.7 mmol/L (ref 3.5–5.2)
Sodium: 139 mmol/L (ref 134–144)

## 2019-01-03 LAB — LIPID PANEL
Chol/HDL Ratio: 3 ratio (ref 0.0–5.0)
Cholesterol, Total: 113 mg/dL (ref 100–199)
HDL: 38 mg/dL — ABNORMAL LOW (ref >39)
LDL Chol Calc (NIH): 62 mg/dL (ref 0–99)
Triglycerides: 58 mg/dL (ref 0–149)
VLDL Cholesterol Cal: 13 mg/dL (ref 5–40)

## 2019-01-03 LAB — HEPATIC FUNCTION PANEL
ALT: 27 IU/L (ref 0–44)
AST: 19 IU/L (ref 0–40)
Albumin: 4.6 g/dL (ref 3.8–4.8)
Alkaline Phosphatase: 108 IU/L (ref 39–117)
Bilirubin Total: 0.7 mg/dL (ref 0.0–1.2)
Bilirubin, Direct: 0.2 mg/dL (ref 0.00–0.40)
Total Protein: 6.7 g/dL (ref 6.0–8.5)

## 2019-01-03 LAB — PSA: Prostate Specific Ag, Serum: 0.5 ng/mL (ref 0.0–4.0)

## 2019-01-05 ENCOUNTER — Encounter: Payer: Self-pay | Admitting: Cardiovascular Disease

## 2019-01-05 ENCOUNTER — Ambulatory Visit (INDEPENDENT_AMBULATORY_CARE_PROVIDER_SITE_OTHER): Payer: PRIVATE HEALTH INSURANCE | Admitting: Cardiovascular Disease

## 2019-01-05 ENCOUNTER — Other Ambulatory Visit: Payer: Self-pay

## 2019-01-05 DIAGNOSIS — I2510 Atherosclerotic heart disease of native coronary artery without angina pectoris: Secondary | ICD-10-CM

## 2019-01-05 NOTE — Assessment & Plan Note (Addendum)
History of hyperlipidemia on statin therapy followed by his PCP.  Recent lipid profile performed 01/03/2019 revealed total cluster 113, LDL 62 and HDL 38.

## 2019-01-05 NOTE — Assessment & Plan Note (Signed)
History of CAD status post STEMI 01/27/2009 secondary to an occluded large second diagonal branch which I stented using 2 overlapping bare-metal stents.  He did have moderate inferolateral hypokinesia with an EF of 45 to 50%, peak CPK of 1000 and MB of 143.  His echo ultimately normalized.  Stress test performed 05/23/2013 for preoperative clearance showed a small area of apical scar without ischemia.  He denies chest pain or shortness of breath.

## 2019-01-05 NOTE — Patient Instructions (Signed)
Medication Instructions:  No changes *If you need a refill on your cardiac medications before your next appointment, please call your pharmacy*  Lab Work: None ordered If you have labs (blood work) drawn today and your tests are completely normal, you will receive your results only by: Marland Kitchen MyChart Message (if you have MyChart) OR . A paper copy in the mail If you have any lab test that is abnormal or we need to change your treatment, we will call you to review the results.  Testing/Procedures: None ordered  Follow-Up: At [facility], you and your health needs are our priority.  As part of our continuing mission to provide you with exceptional heart care, we have created designated Provider Care Teams.  These Care Teams include your primary Cardiologist (physician) and Advanced Practice Providers (APPs -  Physician Assistants and Nurse Practitioners) who all work together to provide you with the care you need, when you need it.  Your next appointment:   12 months  The format for your next appointment:   In Person  Provider:   Quay Burow, MD

## 2019-01-05 NOTE — Progress Notes (Signed)
01/05/2019 Connor Sanford   June 21, 1955  [id number]  Primary Physician Mikey Kirschner, MD Primary Cardiologist: [name] MD Garret Reddish, [place], [place]  HPI:  CONNERY NICKELL is a 63 y.o.   mildly overweight married Caucasian male, father of 2, who retired on Jun 29 2012 from working at the [employer]. I last saw him in the office  11/30/2017.  He has a history of hyperlipidemia as well as a strong family history of heart disease and a father who had bypass surgery age 62. 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 He did have moderate inferolateral hypokinesia with an EF of 45% to 50%, with a peak CPK of 1000 and MB of 143. Echocardiogram performed April 06, 2009, showed normal LV systolic function without segmental wall motion abnormalities, and Myoview showed inferoapical scar without ischemia. He did participate in cardiac . He did fall down in January and injured his left shoulder and now requiring rotator cuff repair. A Myoview stress test performed on 05/23/13 for preoperative clearance revealed a small area of apical scar with preserved ejection fraction.  Since I saw him a year ago he is remained stable.  He denies chest pain or shortness of breath.    Current Meds  Medication Sig  . ALPRAZolam (XANAX) 1 MG tablet TAKE ONE TABLET (1MG  TOTAL) BY MOUTH AT BEDTIME AS NEEDED FOR SLEEP  . aspirin EC 81 MG tablet Take 81 mg by mouth daily.    Marland Kitchen atorvastatin (LIPITOR) 40 MG tablet TAKE ONE TABLET (40MG  TOTAL) BY MOUTH DAILY AT 6PM  . clopidogrel (PLAVIX) 75 MG tablet TAKE ONE TABLET (75MG  TOTAL) BY MOUTH DAILY  . diphenhydramine-acetaminophen (TYLENOL PM) 25-500 MG TABS Take 1 tablet by mouth at bedtime as needed (for sleep). Reported on 05/01/2015  . fexofenadine-pseudoephedrine (ALLEGRA-D) 60-120 MG per tablet Take 1 tablet by mouth 2 (two) times daily.  .  metoprolol tartrate (LOPRESSOR) 25 MG tablet Take 0.5 tablets (12.5 mg total) by mouth 2 (two) times daily.  . Multiple Vitamins-Minerals (MULTIVITAMIN WITH IRON-MINERALS) liquid Take 30 mLs by mouth daily.    . nitroGLYCERIN (NITROSTAT) 0.4 MG SL tablet Place 1 tablet (0.4 mg total) under the tongue every 5 (five) minutes as needed for chest pain.     No Known Allergies  Social History   Socioeconomic History  . Marital status: Married    Spouse name: Not on file  . Number of children: Not on file  . Years of education: Not on file  . Highest education level: Not on file  Occupational History  . Not on file  Social Needs  . Financial resource strain: Not on file  . Food insecurity    Worry: Not on file    Inability: Not on file  . Transportation needs    Medical: Not on file    Non-medical: Not on file  Tobacco Use  . Smoking status: Never Smoker  . Smokeless tobacco: Never Used  Substance and Sexual Activity  . Alcohol use: No  . Drug use: No  . Sexual activity: Yes    Birth control/protection: None  Lifestyle  . Physical activity    Days per week: Not on file    Minutes per session: Not on file  . Stress: Not on file  Relationships  . Social connections    Talks on phone: Not on file  Gets together: Not on file    Attends religious service: Not on file    Active member of club or organization: Not on file    Attends meetings of clubs or organizations: Not on file    Relationship status: Not on file  . Intimate partner violence    Fear of current or ex partner: Not on file    Emotionally abused: Not on file    Physically abused: Not on file    Forced sexual activity: Not on file  Other Topics Concern  . Not on file  Social History Narrative  . Not on file     Review of Systems: General: negative for chills, fever, night sweats or weight changes.  Cardiovascular: negative for chest pain, dyspnea on exertion, edema, orthopnea, palpitations, paroxysmal  nocturnal dyspnea or shortness of breath Dermatological: negative for rash Respiratory: negative for cough or wheezing Urologic: negative for hematuria Abdominal: negative for nausea, vomiting, diarrhea, bright red blood per rectum, melena, or hematemesis Neurologic: negative for visual changes, syncope, or dizziness All other systems reviewed and are otherwise negative except as noted above.    Blood pressure 124/66, pulse (!) 59, temperature (!) 97.3 F (36.3 C), height 5\' 10"  (1.778 m), weight 221 lb (100.2 kg).  General appearance: alert and no distress Neck: no adenopathy, no carotid bruit, no JVD, supple, symmetrical, trachea midline and thyroid not enlarged, symmetric, no tenderness/mass/nodules Lungs: clear to auscultation bilaterally Heart: regular rate and rhythm, S1, S2 normal, no murmur, click, rub or gallop Extremities: extremities normal, atraumatic, no cyanosis or edema Pulses: 2+ and symmetric Skin: Skin color, texture, turgor normal. No rashes or lesions Neurologic: Alert and oriented X 3, normal strength and tone. Normal symmetric reflexes. Normal coordination and gait  EKG sinus bradycardia 59 with rightward axis.  I Personally reviewed this EKG.  ASSESSMENT AND PLAN:   Atherosclerotic heart disease History of CAD status post STEMI 01/27/2009 secondary to an occluded large second diagonal branch which I stented using 2 overlapping bare-metal stents.  He did have moderate inferolateral hypokinesia with an EF of 45 to 50%, peak CPK of 1000 and MB of 143.  His echo ultimately normalized.  Stress test performed 05/23/2013 for preoperative clearance showed a small area of apical scar without ischemia.  He denies chest pain or shortness of breath.  Other and unspecified hyperlipidemia History of hyperlipidemia on statin therapy followed by his PCP.  Recent lipid profile performed 01/03/2019 revealed total cluster 113, LDL 62 and HDL 38.      [name] MD  [facility] 01/05/2019 3:34 PM

## 2019-01-09 ENCOUNTER — Encounter: Payer: PRIVATE HEALTH INSURANCE | Admitting: Family Medicine

## 2019-01-16 ENCOUNTER — Other Ambulatory Visit: Payer: Self-pay

## 2019-01-16 ENCOUNTER — Encounter: Payer: Self-pay | Admitting: Family Medicine

## 2019-01-16 ENCOUNTER — Ambulatory Visit (INDEPENDENT_AMBULATORY_CARE_PROVIDER_SITE_OTHER): Payer: PRIVATE HEALTH INSURANCE | Admitting: Family Medicine

## 2019-01-16 VITALS — BP 136/74 | Temp 97.8°F | Ht 69.5 in | Wt 228.6 lb

## 2019-01-16 DIAGNOSIS — Z0000 Encounter for general adult medical examination without abnormal findings: Secondary | ICD-10-CM | POA: Diagnosis not present

## 2019-01-16 MED ORDER — NITROGLYCERIN 0.4 MG SL SUBL: 0.4000 mg | SUBLINGUAL_TABLET | SUBLINGUAL | 5 refills | Status: DC | PRN

## 2019-01-16 MED ORDER — ALPRAZOLAM 1 MG PO TABS: ORAL_TABLET | ORAL | 5 refills | Status: DC

## 2019-01-16 NOTE — Progress Notes (Signed)
Subjective:    Patient ID: Connor Sanford, male    DOB: 1955-10-09, 63 y.o.   MRN: [id number]  HPI The patient comes in today for a wellness visit.    A review of their health history was completed.  A review of medications was also completed.  Any needed refills; Nitroglycerin tablets   Eating habits: tries to be healthy  Falls/  MVA accidents in past few months: none  Regular exercise: none  Specialist pt sees on regular basis: cardiologist, eye dr  Preventative health issues were discussed.   Additional concerns:   Staying active  Exercise  gainging weight  Results for orders placed or performed in visit on 12/08/18  Lipid Profile  Result Value Ref Range   Cholesterol, Total 113 100 - 199 mg/dL   Triglycerides 58 0 - 149 mg/dL   HDL 38 (L) >39 mg/dL   VLDL Cholesterol Cal 13 5 - 40 mg/dL   LDL Chol Calc (NIH) 62 0 - 99 mg/dL   Chol/HDL Ratio 3.0 0.0 - 5.0 ratio  Hepatic function panel  Result Value Ref Range   Total Protein 6.7 6.0 - 8.5 g/dL   Albumin 4.6 3.8 - 4.8 g/dL   Bilirubin Total 0.7 0.0 - 1.2 mg/dL   Bilirubin, Direct 0.20 0.00 - 0.40 mg/dL   Alkaline Phosphatase 108 39 - 117 IU/L   AST 19 0 - 40 IU/L   ALT 27 0 - 44 IU/L  Basic Metabolic Panel (BMET)  Result Value Ref Range   Glucose 112 (H) 65 - 99 mg/dL   BUN 18 8 - 27 mg/dL   Creatinine, Ser 1.14 0.76 - 1.27 mg/dL   GFR calc non Af Amer 69 >59 mL/min/1.73   GFR calc Af Amer 79 >59 mL/min/1.73   BUN/Creatinine Ratio 16 10 - 24   Sodium 139 134 - 144 mmol/L   Potassium 4.7 3.5 - 5.2 mmol/L   Chloride 102 96 - 106 mmol/L   CO2 21 20 - 29 mmol/L   Calcium 9.4 8.6 - 10.2 mg/dL  PSA  Result Value Ref Range   Prostate Specific Ag, Serum 0.5 0.0 - 4.0 ng/mL    Some diab  In the family   Has know dn cad    Review of Systems  Constitutional: Negative for activity change, appetite change and fever.  HENT: Negative for congestion and rhinorrhea.   Eyes: Negative for discharge.   Respiratory: Negative for cough and wheezing.   Cardiovascular: Negative for chest pain.  Gastrointestinal: Negative for abdominal pain, blood in stool and vomiting.  Genitourinary: Negative for difficulty urinating and frequency.  Musculoskeletal: Negative for neck pain.  Skin: Negative for rash.  Allergic/Immunologic: Negative for environmental allergies and food allergies.  Neurological: Negative for weakness and headaches.  Psychiatric/Behavioral: Negative for agitation.  All other systems reviewed and are negative.      Objective:   Physical Exam Vitals signs reviewed.  Constitutional:      Appearance: He is well-developed.  HENT:     Head: Normocephalic and atraumatic.     Right Ear: External ear normal.     Left Ear: External ear normal.     Nose: Nose normal.  Eyes:     Pupils: Pupils are equal, round, and reactive to light.  Neck:     Musculoskeletal: Normal range of motion and neck supple.     Thyroid: No thyromegaly.  Cardiovascular:     Rate and Rhythm: Normal rate and regular rhythm.  Heart sounds: Normal heart sounds. No murmur.  Pulmonary:     Effort: Pulmonary effort is normal. No respiratory distress.     Breath sounds: Normal breath sounds. No wheezing.  Abdominal:     General: Bowel sounds are normal. There is no distension.     Palpations: Abdomen is soft. There is no mass.     Tenderness: There is no abdominal tenderness.  Genitourinary:    Penis: Normal.   Musculoskeletal: Normal range of motion.  Lymphadenopathy:     Cervical: No cervical adenopathy.  Skin:    General: Skin is warm and dry.     Findings: No erythema.  Neurological:     Mental Status: He is alert.     Motor: No abnormal muscle tone.  Psychiatric:        Behavior: Behavior normal.        Judgment: Judgment normal.           Assessment & Plan:  Impression wellness exam.  Diet discussed.  Exercise discussed.  Followed by cardiologist for known coronary artery disease.   Xanax prescribed primarily for insomnia this is refilled.  Blood work discussed.  Flu shot already given nrxt colon due in 7 yrs which would be next yr

## 2019-01-17 ENCOUNTER — Encounter: Payer: PRIVATE HEALTH INSURANCE | Admitting: Family Medicine

## 2019-02-24 ENCOUNTER — Other Ambulatory Visit: Payer: Self-pay | Admitting: Cardiovascular Disease

## 2019-02-24 DIAGNOSIS — I2510 Atherosclerotic heart disease of native coronary artery without angina pectoris: Secondary | ICD-10-CM

## 2019-04-05 ENCOUNTER — Encounter: Payer: Self-pay | Admitting: Family Medicine

## 2019-04-23 ENCOUNTER — Telehealth: Payer: Self-pay | Admitting: Orthopedic Surgery

## 2019-04-23 NOTE — Telephone Encounter (Signed)
Call received from patient regarding new injury to right shoulder, work-related; requests to schedule appointment with Dr Aline Brochure. Relayed he has been approved by employer, [employer] department, and has been given the following claim# [id number]. Appointment on hold for Friday, 04/27/19 for worker's comp form to be completed and signed. Faxed to attention: Lavone Orn, Fire chief, fax# [phone] / direct ph# [phone]. Patient aware.

## 2019-04-25 ENCOUNTER — Ambulatory Visit: Payer: PRIVATE HEALTH INSURANCE | Attending: Internal Medicine

## 2019-04-25 ENCOUNTER — Other Ambulatory Visit: Payer: Self-pay

## 2019-04-25 DIAGNOSIS — Z20822 Contact with and (suspected) exposure to covid-19: Secondary | ICD-10-CM

## 2019-04-25 NOTE — Telephone Encounter (Signed)
On Tuesday, 04/24/19, received fax from patient's employer, contact person, Lavone Orn, fire chief. Called patient and scheduled appointment with Dr Aline Brochure for 04/27/19; patient and employer aware.

## 2019-04-26 LAB — NOVEL CORONAVIRUS, NAA: SARS-CoV-2, NAA: NOT DETECTED

## 2019-04-27 ENCOUNTER — Ambulatory Visit (INDEPENDENT_AMBULATORY_CARE_PROVIDER_SITE_OTHER): Payer: Worker's Compensation | Admitting: Orthopedic Surgery

## 2019-04-27 ENCOUNTER — Ambulatory Visit: Payer: Self-pay

## 2019-04-27 ENCOUNTER — Other Ambulatory Visit: Payer: Self-pay

## 2019-04-27 ENCOUNTER — Encounter: Payer: Self-pay | Admitting: Orthopedic Surgery

## 2019-04-27 VITALS — BP 131/83 | HR 65 | Temp 96.8°F | Ht 70.0 in | Wt 221.0 lb

## 2019-04-27 DIAGNOSIS — S46211A Strain of muscle, fascia and tendon of other parts of biceps, right arm, initial encounter: Secondary | ICD-10-CM | POA: Diagnosis not present

## 2019-04-27 DIAGNOSIS — M25511 Pain in right shoulder: Secondary | ICD-10-CM | POA: Diagnosis not present

## 2019-04-27 NOTE — Progress Notes (Signed)
Connor Sanford  04/27/2019  Body mass index is 31.71 kg/m.   HISTORY SECTION :  Chief Complaint  Patient presents with  . Shoulder Injury    Right shoulder injury, Worker's comp, DOI 04-19-19.   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  He has taken some Tylenol ibuprofen and Biofreeze comes in for evaluation and treatment.   Review of Systems  All other systems reviewed and are negative.    has a past medical history of Coronary artery disease, H/O seasonal allergies, Hyperlipidemia, Hypertension, and MI (myocardial infarction) ([place]) (2010).   Past Surgical History:  Procedure Laterality Date  . CARDIAC CATHETERIZATION  01/27/2009   Occluded second diagonal branch-stented with a 2.25x109m Integrity Medtronic bare-metal stent at 12 atm (2.38-mm) resulting in reduction of 100% stenosis to 0% residual. Small fairly focal stenosis just distalof stented segment-stented with a 2.25x879mIntegrity Medtronic bare-metal stent at 12 atm, reduced to 0% residual with TIMI3 flow.  . Marland KitchenARDIOVASCULAR STRESS TEST  04/16/2009   Mild perfusion defect seen in Mid Anterior, Apical Anterior, and Apical regions - consistent with infarct/scar. No scintigraphic evidence of inducible myocardial ischemia. EKG negative for ischemia. No ECG changes.  . COLONOSCOPY N/A 12/27/2012   Procedure: COLONOSCOPY;  Surgeon: NaRogene HoustonMD;  Location: [facility] SUITE;  Service: Endoscopy;  Laterality: N/A;  730  . CORONARY ANGIOPLASTY     2 stents  . RESECTION DISTAL CLAVICAL Left 06/13/2013   Procedure: RESECTION DISTAL CLAVICAL;  Surgeon: StCarole CivilMD;  Location: [facility] ORS;  Service: Orthopedics;  Laterality: Left;  . SHOULDER OPEN  ROTATOR CUFF REPAIR Left 06/13/2013   Procedure: ROTATOR CUFF REPAIR SHOULDER OPEN;  Surgeon: StCarole CivilMD;  Location: [facility] ORS;  Service: Orthopedics;  Laterality: Left;  . TRANSTHORACIC ECHOCARDIOGRAM  04/16/2009   EF >55%, no significant valvular abnormalities.    Body mass index is 31.71 kg/m.   No Known Allergies   Current Outpatient Medications:  .  ALPRAZolam (XANAX) 1 MG tablet, TAKE ONE TABLET (1MG TOTAL) BY MOUTH AT BEDTIME AS NEEDED FOR SLEEP, Disp: 30 tablet, Rfl: 5 .  aspirin EC 81 MG tablet, Take 81 mg by mouth daily.  , Disp: , Rfl:  .  atorvastatin (LIPITOR) 40 MG tablet, TAKE 1 TABLET BY MOUTH DAILY AT 6 PM, Disp: 90 tablet, Rfl: 3 .  clopidogrel (PLAVIX) 75 MG tablet, TAKE ONE (1) TABLET BY MOUTH EVERY DAY, Disp: 90 tablet, Rfl: 3 .  fexofenadine-pseudoephedrine (ALLEGRA-D) 60-120 MG per tablet, Take 1 tablet by mouth 2 (two) times daily., Disp: , Rfl:  .  metoprolol tartrate (LOPRESSOR) 25 MG tablet, TAKE ONE-HALF TABLET (12.5MG TOTAL) BY MOUTH TWO TIMES DAILY, Disp: 90 tablet, Rfl: 3 .  Multiple Vitamins-Minerals (MULTIVITAMIN WITH IRON-MINERALS) liquid, Take 30 mLs by mouth daily.  , Disp: , Rfl:  .  nitroGLYCERIN (NITROSTAT) 0.4 MG SL tablet, Place 1 tablet (0.4 mg total) under the tongue every 5 (five) minutes as needed for chest pain., Disp: 25 tablet, Rfl: 5 .  diphenhydramine-acetaminophen (TYLENOL PM) 25-500 MG TABS, Take 1 tablet by mouth at bedtime as needed (for sleep). Reported on 05/01/2015, Disp: , Rfl:    PHYSICAL EXAM SECTION: 1) BP  131/83   Pulse 65   Temp (!) 96.8 F (36 C)   Ht 5' 10"  (1.778 m)   Wt 221 lb (100.2 kg)   BMI 31.71 kg/m   Body mass index is 31.71 kg/m. General appearance: Well-developed well-nourished no gross deformities  2) Cardiovascular normal pulse and perfusion , normal color   3) Neurologically deep tendon reflexes are equal and normal, no sensation loss or deficits no pathologic reflexes  4) Psychological: Awake  alert and oriented x3 mood and affect normal  5) Skin no lacerations or ulcerations no nodularity no palpable masses, no erythema or nodularity  6) Musculoskeletal:   Left shoulder full range of motion  Right shoulder tenderness in the front of the shoulder no Popeye sign but pain with speeds maneuver normal cuff strength mild pain on descent of the shoulder   MEDICAL DECISION MAKING  A.  Encounter Diagnoses  Name Primary?  . Acute pain of right shoulder Yes  . Biceps strain, right, initial encounter     B. DATA ANALYSED:  IMAGING: Independent interpretation of images: Office x-rays were obtained no fracture dislocation type I acromion  Orders: None  Outside records reviewed: None  C. MANAGEMENT   Feels like he has a biceps strain based on the mechanism of injury and the positive speeds test and normal cuff strength  Recommend no heavy lifting for 3 weeks follow-up in 3 weeks  Patient says there is no light duty so is out of work for 3 weeks  No orders of the defined types were placed in this encounter.     Arther Abbott, MD  04/27/2019 10:47 AM

## 2019-04-27 NOTE — Patient Instructions (Addendum)
NO WORK 3 WEEKS  TAKE TYLENOL 500 MG EVERY 6 HRS AS NEEDED AND USE BIOFREEZE   Proximal Biceps Tendon Tear  The proximal biceps tendon is a strong cord of tissue that connects the biceps muscle-which is the muscle on the front of the upper arm-to the shoulder blade. A proximal biceps tendon tear (rupture) can include a partial or complete tear of the tendon near where it connects to the bone of the shoulder. This injury can interfere with your ability to lift your arm in front of your body, stabilize your shoulder, bend your elbow, and turn your hand palm-up (supination). What are the causes? This condition happens when too much force is placed on the tendon. This excess force may be caused by:  The elbow being suddenly straightened from a bent position because of an external force. This could happen, for example, while catching a heavy weight or being pulled when waterskiing.  Wear and tear from physical activity.  Breaking a fall with your hand. What increases the risk? The following factors may make you more likely to develop this condition:  Playing contact sports.  Doing activities or sports that involve throwing or overhead movements, such as racket sports, gymnastics, or baseball.  Doing activities or sports that involve putting sudden force on the arm, such as weight lifting or waterskiing.  Having a weakened tendon because of: ? Long-lasting (chronic) biceps tendinitis. ? Certain medical conditions, such as diabetes or rheumatoid arthritis. ? Repeated corticosteroid use. ? Repetitive overhead movements. What are the signs or symptoms? Symptoms of this condition may include:  Sudden sharp pain in the front of the shoulder. Pain may get worse during certain movements, such as: ? Lifting or carrying objects. ? Straightening the elbow. ? Throwing or using overhead movements.  Inflammation or a feeling of unusual warmth on the front of the shoulder.  Painful tightening  (spasm) of the biceps muscle.  A bulge on the inside of the upper arm when the elbow is bent.  Bruising in the shoulder or upper arm. This may develop 24-48 hours after the tendon is injured.  Limited range of motion of the shoulder and elbow.  Weakness in the elbow and forearm when: ? Bending the elbow. ? Rotating the wrist. How is this diagnosed? This condition may be diagnosed based on:  Your symptoms and medical history.  A physical exam. Your health care provider may test the strength and range of motion of your shoulder and elbow.  Imaging tests, such as: ? X-rays. ? MRI. ? Ultrasound. How is this treated? Treatment depends on the severity of the condition. Treatment may include:  Medicines to help relieve pain and inflammation.  Resting and icing the injured area.  Avoiding certain activities that put stress on your shoulder.  Physical therapy.  Surgery to repair the tear. This may be needed if nonsurgical treatments do not improve your condition.  One or more injections of medicines (corticosteroids) into your upper arm to help reduce inflammation. This treatment is rare. Follow these instructions at home: Managing pain, stiffness, and swelling      If directed, put ice on the injured area: ? Put ice in a plastic bag. ? Place a towel between your skin and the bag. ? Leave the ice on for 20 minutes, 2-3 times a day.  If directed, apply heat to the affected area before you exercise or as often as told by your health care provider. Use the heat source that your health care  provider recommends, such as a moist heat pack or a heating pad. ? Place a towel between your skin and the heat source. ? Leave the heat on for 20-30 minutes. ? Remove the heat if your skin turns bright red. This is especially important if you are unable to feel pain, heat, or cold. You may have a greater risk of getting burned.  Move your fingers often to avoid stiffness and to lessen  swelling.  Raise (elevate) the injured area while you are sitting or lying down. Activity  Return to your normal activities as told by your health care provider. Ask your health care provider what activities are safe for you.  Avoid activities that cause pain or make your condition worse.  Do not lift anything that is heavier than 10 lb (4.5 kg), or the limit that you are told, until your health care provider says that it is safe.  Do exercises as told by your physical therapist or health care provider. General instructions  Take over-the-counter and prescription medicines only as told by your health care provider.  Do not use any products that contain nicotine or tobacco, such as cigarettes and e-cigarettes. If you need help quitting, ask your health care provider.  Keep all follow-up visits as told by your health care provider. This is important. How is this prevented? Take these steps to help prevent reinjury:  Warm up and stretch before being active.  Cool down and stretch after being active.  Give your body time to rest between periods of activity.  Make sure you use equipment that fits you.  Be safe and responsible while being active. This will help you avoid falls.  Maintain physical fitness, including strength and flexibility. Contact a health care provider if:  You have symptoms that get worse or do not get better after 2 weeks of treatment.  You develop new symptoms. Get help right away if:  You have severe pain.  You develop pain or numbness in your hand.  Your hand feels unusually cold.  Your fingernails turn a dark color, such as blue or gray. Summary  A proximal biceps tendon tear can include a partial or complete tear of the tendon near where it connects to the bone of the shoulder.  This condition happens when too much force is placed on the tendon.  Treatment may include resting and icing the injured area.  Avoid activities that cause pain or  make your condition worse. This information is not intended to replace advice given to you by your health care provider. Make sure you discuss any questions you have with your health care provider. Document Revised: 02/06/2018 Document Reviewed: 07/11/2017 Elsevier Patient Education  [place].

## 2019-05-18 ENCOUNTER — Encounter: Payer: Self-pay | Admitting: Orthopedic Surgery

## 2019-05-18 ENCOUNTER — Ambulatory Visit (INDEPENDENT_AMBULATORY_CARE_PROVIDER_SITE_OTHER): Payer: Worker's Compensation | Admitting: Orthopedic Surgery

## 2019-05-18 ENCOUNTER — Other Ambulatory Visit: Payer: Self-pay

## 2019-05-18 VITALS — BP 131/79 | HR 69 | Ht 70.0 in | Wt 229.0 lb

## 2019-05-18 DIAGNOSIS — S46211D Strain of muscle, fascia and tendon of other parts of biceps, right arm, subsequent encounter: Secondary | ICD-10-CM | POA: Diagnosis not present

## 2019-05-18 NOTE — Patient Instructions (Signed)
Recommend no heavy lifting for 3 weeks follow-up in 3 weeks   Patient says there is no light duty so is out of work for 3 weeks more   Start home exercises

## 2019-05-18 NOTE — Progress Notes (Signed)
Chief Complaint  Patient presents with  . Shoulder Injury    04/19/19 injury date better but still painful / right     64 year old male injured his right shoulder on February 18 appears of a biceps injury he says he is gotten a little bit better but he still has pain and weakness  On examination he has tenderness in the biceps muscle and proximal tendon and shoulder normal cuff strength positive Speed test  Recommend stretching strengthening program out of work 3 weeks come back in 3 weeks  Encounter Diagnosis  Name Primary?  . Strain of right biceps, subsequent encounter Yes

## 2019-06-08 ENCOUNTER — Encounter: Payer: Self-pay | Admitting: Orthopedic Surgery

## 2019-06-08 ENCOUNTER — Other Ambulatory Visit: Payer: Self-pay

## 2019-06-08 ENCOUNTER — Ambulatory Visit (INDEPENDENT_AMBULATORY_CARE_PROVIDER_SITE_OTHER): Payer: Worker's Compensation | Admitting: Orthopedic Surgery

## 2019-06-08 VITALS — Temp 97.7°F | Ht 70.0 in | Wt 227.0 lb

## 2019-06-08 DIAGNOSIS — S46011A Strain of muscle(s) and tendon(s) of the rotator cuff of right shoulder, initial encounter: Secondary | ICD-10-CM

## 2019-06-08 DIAGNOSIS — S46211S Strain of muscle, fascia and tendon of other parts of biceps, right arm, sequela: Secondary | ICD-10-CM

## 2019-06-08 NOTE — Patient Instructions (Signed)
OOW UNTIL MRI DONE AND READ 3 WEEKS   MRI OPEN UNIT / W/C NEED APPROVAL

## 2019-06-08 NOTE — Progress Notes (Signed)
Chief Complaint  Patient presents with  . 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  He initially tried Tylenol ibuprofen Biofreeze he came in for his first evaluation on February 26  At that time were thought to have acute pain right shoulder with biceps strain X-rays were negative his speeds test was positive he had normal cuff strength we took him out of work and then I had him rest the shoulder and come back for a repeat evaluation which he did  On the next visit he says his pain was a little better but he still had a lot of weakness and today continues to complain of that same weakness with some improvement in his range of motion.  The pain is still in the deltoid and anterior shoulder and bicipital groove  He did try stretching strengthening program but again main problem still weakness especially abduction flexion which also cause pain and when he turns his wrist supination pronation he has pain  Temp 97.7 F (36.5 C)   Ht 5\' 10"  (1.778 m)   Wt 227 lb (103 kg)   BMI 32.57 kg/m   Right shoulder exam he does have improved range of motion but he has significant weakness in abduction and flexion is a positive speeds sign for biceps tendon pathology.  He has a positive impingement sign.  His shoulder feels stable.  He has some slight deficits in internal rotation as well  After more than 6 weeks of conservative care and lack of full recovery it is prudent at this time to do an MRI of the right shoulder to rule out a rotator cuff tear biceps tendon tear labral tear so that we can manage his work situation a little bit better and perhaps even send him to physical therapy if everything is structurally good in the shoulder.  Out of work 3 weeks should be enough time for me to get the results back  and talk to him about where to proceed next  Encounter Diagnoses  Name Primary?  . Traumatic tear of right rotator cuff, unspecified tear extent, initial encounter Yes  . Rupture of right proximal biceps tendon, sequela

## 2019-06-11 NOTE — Addendum Note (Signed)
Addended by: Moreen Fowler R on: 06/11/2019 03:03 PM   Modules accepted: Orders

## 2019-06-12 ENCOUNTER — Telehealth: Payer: Self-pay | Admitting: Orthopedic Surgery

## 2019-06-12 NOTE — Telephone Encounter (Signed)
Connor Sanford from [facility] called regarding approval for MRI on this patient.  Please call him at [phone]  Thanks

## 2019-06-14 ENCOUNTER — Telehealth: Payer: Self-pay | Admitting: Radiology

## 2019-06-14 NOTE — Telephone Encounter (Signed)
-----   Message from [name] sent at 06/14/2019  3:24 PM EDT ----- Regarding: MRI appr'd and scheduled Connor Sanford,  Connor Sanford [id number], has been approved by W/Comp. I;ve updated and entered all information into referral WQ / have the info in your box :)

## 2019-06-22 NOTE — Telephone Encounter (Signed)
(  Done)See notes in referral workqueue (MRI completed as scheduled on 06/21/19 at [place]. On 06/22/19, patient brought in copy of MRI film(CD); holding for report - faxed request for report (patient said that [facility] also had Dr Wolfgang Phoenix on file - requested report to be faxed to Dr Harrison/ordering provider. Patient aware he will receive a call with results.

## 2019-06-25 NOTE — Telephone Encounter (Signed)
MRI report has been received from [facility].  Film(CD) has been already brought in by patient. Per last office note, 06/08/19, Dr to call patient with MRI results.

## 2019-06-25 NOTE — Telephone Encounter (Signed)
  IMPRESSION: Massive full-thickness, full width, retracted rotator cuff tears involving the supraspinatus and infraspinatus tendons with severe muscle atrophy. High-grade strain throughout the infraspinatus muscle belly  Full-thickness partial width retracted tear of the upper 1/2 of the subscapularis tendon  Focal medial subluxation of the long head biceps tendon over the lesser tuberosity into the defect in the upper subscapularis tendon. 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

## 2019-06-26 NOTE — Telephone Encounter (Signed)
I need to lay eyes on it before I can call

## 2019-06-26 NOTE — Telephone Encounter (Addendum)
The directions for pulling it up are on the viewbox above your computer.   I will show you if needed.   There is also a disk I just put it on your desk

## 2019-06-28 ENCOUNTER — Telehealth: Payer: Self-pay | Admitting: Orthopedic Surgery

## 2019-06-28 NOTE — Telephone Encounter (Signed)
Patient is asking if he may have his out of work note extended; current end date is 06/29/19.  Review of MRI results is pending.  Please advise of work status.

## 2019-06-29 ENCOUNTER — Ambulatory Visit: Payer: Self-pay | Admitting: Orthopedic Surgery

## 2019-06-29 ENCOUNTER — Encounter: Payer: Self-pay | Admitting: Orthopedic Surgery

## 2019-06-29 NOTE — Telephone Encounter (Signed)
Called the patient but he was not available I did let him know that I will call him this weekend

## 2019-07-02 ENCOUNTER — Telehealth: Payer: Self-pay | Admitting: Orthopedic Surgery

## 2019-07-02 NOTE — Telephone Encounter (Signed)
Patient states that the call with MRI results was received at his wife's (designated party) phone number, 1 digit off. Please try patient back at his cell# [phone], which is the main number in his demographics. Patient has questions regarding 2nd opinion and information per Dr Ruthe Mannan message 06/29/19.

## 2019-07-03 ENCOUNTER — Telehealth: Payer: Self-pay | Admitting: Radiology

## 2019-07-03 ENCOUNTER — Telehealth: Payer: Self-pay | Admitting: Orthopedic Surgery

## 2019-07-03 DIAGNOSIS — M25511 Pain in right shoulder: Secondary | ICD-10-CM

## 2019-07-03 NOTE — Telephone Encounter (Signed)
-----   Message from [name] sent at 07/03/2019 12:09 PM EDT ----- Regarding: 2nd opinion referral Can a referral for [facility] MR [id number] be entered - ok to not enter provider name for [place] to request 2nd opinion approval?  I have a copy of [facility]  Thank you, Lelaina Oatis

## 2019-07-03 NOTE — Telephone Encounter (Signed)
Yes, this has been entered

## 2019-07-03 NOTE — Telephone Encounter (Signed)
Needs second opinion we discussed reverse versus superior capsular reconstruction  Patient has 0-1 pain but can't reach above his head or get a gallon of milk out of the refrigerator

## 2019-07-03 NOTE — Telephone Encounter (Signed)
Worker's Comp. case  Needs second opinion  I recommended Dr. Marlou Sa if Worker's Comp. approves also recommend Landau or Dr. Tamera Punt at [facility]

## 2019-07-03 NOTE — Telephone Encounter (Signed)
Forwarded notes and MRI report to [employer]; called patient to notify of status. Second opinion pending.

## 2019-07-03 NOTE — Telephone Encounter (Signed)
Results   Needs second opinion  Not a candidate for traditional shoulder replacement possible reverse or superior capsular reconstruction  Recommend Dean or Mardelle Matte or [employer]

## 2019-07-04 NOTE — Telephone Encounter (Signed)
Call received from worker's comp adjuster Charlesetta Garibaldi; verbal approval received for 2nd opinion with Dr Marlou Sa; states that he will now assign a nurse case manager, due to pending surgery by Dr Marlou Sa. Notified patient. (also updated referral in workqueue with this information)

## 2019-07-17 NOTE — Telephone Encounter (Signed)
Please sign encounter 

## 2019-07-18 ENCOUNTER — Ambulatory Visit: Payer: PRIVATE HEALTH INSURANCE | Admitting: Orthopedic Surgery

## 2019-09-26 ENCOUNTER — Telehealth: Payer: Self-pay | Admitting: Orthopedic Surgery

## 2019-09-26 NOTE — Telephone Encounter (Signed)
Patient stopped by office to inquire about an apparent new problem with right elbow - patient said it looks like a fluid pocket - states it has been "puffy and soft" for about 1 month. States he is under care of Dr Tamera Punt in [place] for shoulder surgery, per referral from [employer]. Patient states Dr Tamera Punt looked at it and said it was not related to the shoulder surgery. Please advise if may schedule here (patient aware no immediate available - next available 10/17/19) or if patient is to purse with current orthopaedic provider.

## 2019-09-26 NOTE — Telephone Encounter (Signed)
Ok see him here

## 2019-09-27 ENCOUNTER — Ambulatory Visit: Payer: PRIVATE HEALTH INSURANCE

## 2019-09-27 ENCOUNTER — Other Ambulatory Visit: Payer: Self-pay

## 2019-09-27 ENCOUNTER — Encounter: Payer: Self-pay | Admitting: Orthopedic Surgery

## 2019-09-27 ENCOUNTER — Ambulatory Visit: Payer: PRIVATE HEALTH INSURANCE | Admitting: Orthopedic Surgery

## 2019-09-27 VITALS — BP 149/86 | HR 70 | Ht 70.0 in | Wt 225.0 lb

## 2019-09-27 DIAGNOSIS — M7021 Olecranon bursitis, right elbow: Secondary | ICD-10-CM

## 2019-09-27 NOTE — Telephone Encounter (Signed)
Aware of appointment, scheduled in open slot for today.

## 2019-09-27 NOTE — Progress Notes (Signed)
NEW PROBLEM//OFFICE VISIT  Chief Complaint  Patient presents with   Elbow Problem    right olecranon bursal swelling since June 38     64 year old male presents with a swollen right elbow since June 10 he relates it to playing corn hole a day or so before.  He had a day of pain and then the pain went away but the swelling got bigger and he was concerned.   Review of Systems  Musculoskeletal:       Seeing Dr. Tamera Punt for right rotator cuff tear therapy  Skin: Negative.   Neurological: Negative.      Past Medical History:  Diagnosis Date   Coronary artery disease    H/O seasonal allergies    Hyperlipidemia    Hypertension    MI (myocardial infarction) ([place]) 2010    Past Surgical History:  Procedure Laterality Date   CARDIAC CATHETERIZATION  01/27/2009   Occluded second diagonal branch-stented with a 2.25x23m Integrity Medtronic bare-metal stent at 12 atm (2.38-mm) resulting in reduction of 100% stenosis to 0% residual. Small fairly focal stenosis just distalof stented segment-stented with a 2.25x820mIntegrity Medtronic bare-metal stent at 12 atm, reduced to 0% residual with TIMI3 flow.   CARDIOVASCULAR STRESS TEST  04/16/2009   Mild perfusion defect seen in Mid Anterior, Apical Anterior, and Apical regions - consistent with infarct/scar. No scintigraphic evidence of inducible myocardial ischemia. EKG negative for ischemia. No ECG changes.   COLONOSCOPY N/A 12/27/2012   Procedure: COLONOSCOPY;  Surgeon: NaRogene HoustonMD;  Location: [facility];  Service: Endoscopy;  Laterality: N/A;  730   CORONARY ANGIOPLASTY     2 stents   RESECTION DISTAL CLAVICAL Left 06/13/2013   Procedure: RESECTION DISTAL CLAVICAL;  Surgeon: StCarole CivilMD;  Location: [facility] ORS;  Service: Orthopedics;  Laterality: Left;   SHOULDER OPEN ROTATOR CUFF REPAIR Left 06/13/2013   Procedure: ROTATOR CUFF REPAIR SHOULDER OPEN;  Surgeon: StCarole CivilMD;  Location: [facility] ORS;  Service:  Orthopedics;  Laterality: Left;   TRANSTHORACIC ECHOCARDIOGRAM  04/16/2009   EF >55%, no significant valvular abnormalities.    Family History  Problem Relation Age of Onset   Heart disease Father    Heart disease Paternal Grandmother    Colon cancer Neg Hx    Social History   Tobacco Use   Smoking status: Never Smoker   Smokeless tobacco: Never Used  Substance Use Topics   Alcohol use: No   Drug use: No    No Known Allergies  Current Meds  Medication Sig   ALPRAZolam (XANAX) 1 MG tablet TAKE ONE TABLET (1MG TOTAL) BY MOUTH AT BEDTIME AS NEEDED FOR SLEEP   aspirin EC 81 MG tablet Take 81 mg by mouth daily.     atorvastatin (LIPITOR) 40 MG tablet TAKE 1 TABLET BY MOUTH DAILY AT 6 PM   clopidogrel (PLAVIX) 75 MG tablet TAKE ONE (1) TABLET BY MOUTH EVERY DAY   diphenhydramine-acetaminophen (TYLENOL PM) 25-500 MG TABS Take 1 tablet by mouth at bedtime as needed (for sleep). Reported on 05/01/2015   fexofenadine-pseudoephedrine (ALLEGRA-D) 60-120 MG per tablet Take 1 tablet by mouth 2 (two) times daily.   metoprolol tartrate (LOPRESSOR) 25 MG tablet TAKE ONE-HALF TABLET (12.5MG TOTAL) BY MOUTH TWO TIMES DAILY   Multiple Vitamins-Minerals (MULTIVITAMIN WITH IRON-MINERALS) liquid Take 30 mLs by mouth daily.      BP (!) 149/86    Pulse 70    Ht 5' 10" (1.778 m)    WtAnnell Greening!Marland Kitchen  225 lb (102.1 kg)    BMI 32.28 kg/m²  ° ° ° °Ortho Exam ° °Normal development grooming and hygiene awake alert and oriented x3 ° °Right elbow large swollen mass no erythema no tenderness full range of motion of the elbow normal strength and stability with some shoulder weakness with the arm away from the body left elbow looks normal ° °MEDICAL DECISION MAKING ° °A.  °Encounter Diagnosis  °Name Primary?  °• Olecranon bursitis, right elbow Yes  ° ° °B. DATA ANALYSED: ° ° ° °IMAGING: °Independent interpretation of images: X-rays were done in the office mild degenerative changes seen around the elbow soft tissue  mass consistent with bursitis ° °Orders:  ° °Outside records reviewed:  ° °C. MANAGEMENT  ° °Aspiration recommended patient agreed ° °I aspirated the right elbow.  We used ethyl chloride and alcohol to prepare the skin use an 18 drainage needle 10 cc syringe and got back 25 cc of orange-colored clear fluid ° °Compression wrap for 24 hours to 48 hours ° °Call if comes back patient made aware he may need to have it reaspirated ° °No orders of the defined types were placed in this encounter. ° °Acute uncomplicated with aspiration no medications ° ° °Stanley Harrison, MD ° °09/27/2019 °2:24 PM °

## 2019-09-27 NOTE — Telephone Encounter (Signed)
Called patient - went to voice mail. Cancellation slot for this afternoon may be filled by the time patient returns call.

## 2019-09-27 NOTE — Patient Instructions (Signed)
Elbow Bursitis ° °Bursitis is swelling and pain at the tip of the elbow. This happens when fluid builds up in a sac under the skin (bursa). This may also be called olecranon bursitis. °What are the causes? °Elbow bursitis may be caused by: °· Elbow injury, such as falling onto the elbow. °· Leaning on hard surfaces for long periods of time. °· Infection from an injury that breaks the skin near the elbow. °· A bone growth (spur) that forms at the tip of the elbow. °· A medical condition that causes inflammation, such as gout or rheumatoid arthritis. °Sometimes the cause is not known. °What are the signs or symptoms? °The first sign of elbow bursitis is usually swelling at the tip of the elbow. This can grow to be about the size of a golf ball. Swelling may start suddenly or develop gradually. Other symptoms may include: °· Pain when bending or leaning on the elbow. °· Not being able to move the elbow normally. °If bursitis is caused by an infection, you may have: °· Redness, warmth, and tenderness of the elbow. °· Drainage of pus from the swollen area over the elbow, if the skin breaks open. °How is this diagnosed? °This condition may be diagnosed based on: °· Your symptoms and medical history. °· Any recent injuries you have had. °· A physical exam. °· X-rays to check for a bone spur or fracture. °· Draining fluid from the bursa to test it for infection. °· Blood tests to rule out gout or rheumatoid arthritis. °How is this treated? °Treatment for elbow bursitis depends on the cause. Treatment may include: °· Medicines. These may include: °? Over-the-counter medicines to relieve pain and inflammation. °? Antibiotic medicines. °? Injections of anti-inflammatory medicines (steroids). °· Draining fluid from the bursa. °· Wrapping your elbow with a bandage. °· Wearing elbow pads. °If these treatments do not help, you may need surgery to remove the bursa. °Follow these instructions at home: °Medicines °· Take  over-the-counter and prescription medicines only as told by your health care provider. °· If you were prescribed an antibiotic medicine, take it as told by your health care provider. Do not stop taking the antibiotic even if you start to feel better. °Managing pain, stiffness, and swelling ° °· If directed, put ice on your elbow: °? Put ice in a plastic bag. °? Place a towel between your skin and the bag. °? Leave the ice on for 20 minutes, 2-3 times a day. °· If your bursitis is caused by an injury, rest your elbow and wear your bandage as told by your health care provider. °· Use elbow pads or elbow wraps to cushion your elbow as needed. °General instructions °· Avoid any activities that cause elbow pain. Ask your health care provider what activities are safe for you. °· Keep all follow-up visits as told by your health care provider. This is important. °Contact a health care provider if you have: °· A fever. °· Symptoms that do not get better with treatment. °· Pain or swelling that: °? Gets worse. °? Goes away and then comes back. °· Pus draining from your elbow. °Get help right away if you have: °· Trouble moving your arm, hand, or fingers. °Summary °· Elbow bursitis is inflammation of the fluid-filled sac (bursa) between the tip of your elbow bone (olecranon) and your skin. °· Treatment for elbow bursitis depends on the cause. It may include medicines to relieve pain and inflammation, antibiotic medicines, and draining fluid from your elbow. °·   Contact a health care provider if your symptoms do not get better with treatment, or if your symptoms go away and then come back. °This information is not intended to replace advice given to you by your health care provider. Make sure you discuss any questions you have with your health care provider. °Document Revised: 01/28/2017 Document Reviewed: 01/25/2017 °Elsevier Patient Education © 2020 Elsevier Inc. ° °

## 2019-10-08 ENCOUNTER — Encounter: Payer: Self-pay | Admitting: Orthopedic Surgery

## 2019-10-08 ENCOUNTER — Other Ambulatory Visit: Payer: Self-pay

## 2019-10-08 ENCOUNTER — Ambulatory Visit: Payer: PRIVATE HEALTH INSURANCE | Admitting: Orthopedic Surgery

## 2019-10-08 VITALS — BP 123/79 | HR 62 | Ht 70.0 in | Wt 229.0 lb

## 2019-10-08 DIAGNOSIS — M7021 Olecranon bursitis, right elbow: Secondary | ICD-10-CM | POA: Diagnosis not present

## 2019-10-08 NOTE — Progress Notes (Signed)
Chief Complaint  Patient presents with  . Elbow Pain    Patient has fluid on elbow, reports it come back  2 days after it was drawn off last time.    64 year old male had a recent aspiration of her right elbow olecranon bursitis comes back with a recurrent fluid  Right elbow large amount of fluid over the right olecranon bursa no erythema.  Repeat aspiration patient gave consent to have repeat aspiration site confirmed 18-gauge needle was used to draw off about 10 cc of fluid and the rest of the fluid was squeezed out by hand  30-40 cc of fluid  Ace wrap  Come back if fluid continues.  Briefly discussed possible excision of olecranon bursa if recurrence

## 2019-10-08 NOTE — Patient Instructions (Addendum)
Keep wrap on for 48 hrs

## 2019-10-19 ENCOUNTER — Ambulatory Visit: Payer: PRIVATE HEALTH INSURANCE | Admitting: Orthopedic Surgery

## 2019-10-19 ENCOUNTER — Telehealth: Payer: Self-pay | Admitting: Cardiovascular Disease

## 2019-10-19 NOTE — Telephone Encounter (Signed)
Pt c/o Shortness Of Breath: STAT if SOB developed within the last 24 hours or pt is noticeably SOB on the phone  1. Are you currently SOB (can you hear that pt is SOB on the phone)? No   2. How long have you been experiencing SOB? Of and on  3. Are you SOB when sitting or when up moving around? Moving sometimes  4. Are you currently experiencing any other symptoms? No would like to see Dr. Gwenlyn Found soon.

## 2019-10-19 NOTE — Telephone Encounter (Signed)
Spoke with pt, he reports he can do what he wants but he got on the treadmill the other day and he got SOB sooner than usual but it has been a while since he has been on the treadmill. He denies edema or chest pain. He just wants to get it checked out. Follow up scheduled with app.

## 2019-10-29 ENCOUNTER — Other Ambulatory Visit: Payer: Self-pay

## 2019-10-29 ENCOUNTER — Ambulatory Visit (INDEPENDENT_AMBULATORY_CARE_PROVIDER_SITE_OTHER): Payer: PRIVATE HEALTH INSURANCE | Admitting: Orthopedic Surgery

## 2019-10-29 ENCOUNTER — Encounter: Payer: Self-pay | Admitting: Orthopedic Surgery

## 2019-10-29 VITALS — BP 143/79 | HR 62 | Ht 70.0 in | Wt 229.0 lb

## 2019-10-29 DIAGNOSIS — M7021 Olecranon bursitis, right elbow: Secondary | ICD-10-CM

## 2019-10-29 NOTE — Progress Notes (Signed)
Chief Complaint  Patient presents with  . Elbow Problem    right bursal swelling returned no pain    64 year old male status post 2 aspirations right elbow bursa  Bloody aspiration noted patient on Plavix  Patient on aspirin  Seems to have bleeding into the right olecranon bursa  Last aspirated on August 9  Repeat aspiration  Patient gave consent site was confirmed as right elbow skin was prepped with alcohol and ethyl chloride 10 cc syringe and 18-gauge needle used to aspirate 46 cc of bloody fluid from the right elbow  Compression bandage applied  Patient will follow up on as-needed basis can be added on when the fluid recurs

## 2019-11-08 NOTE — Progress Notes (Signed)
Cardiology Clinic Note   Patient Name: Connor Sanford Date of Encounter: 11/09/2019  Primary Care Provider:  Mikey Kirschner, MD (Inactive) Primary Cardiologist:  Connor Burow, MD  Patient Profile    Connor Sanford 64 year old male presents today for an evaluation of shortness of breath.  Past Medical History    Past Medical History:  Diagnosis Date   Coronary artery disease    H/O seasonal allergies    Hyperlipidemia    Hypertension    MI (myocardial infarction) ([place]) 2010   Past Surgical History:  Procedure Laterality Date   CARDIAC CATHETERIZATION  01/27/2009   Occluded second diagonal branch-stented with a 2.25x55m Integrity Medtronic bare-metal stent at 12 atm (2.38-mm) resulting in reduction of 100% stenosis to 0% residual. Small fairly focal stenosis just distalof stented segment-stented with a 2.25x852mIntegrity Medtronic bare-metal stent at 12 atm, reduced to 0% residual with TIMI3 flow.   CARDIOVASCULAR STRESS TEST  04/16/2009   Mild perfusion defect seen in Mid Anterior, Apical Anterior, and Apical regions - consistent with infarct/scar. No scintigraphic evidence of inducible myocardial ischemia. EKG negative for ischemia. No ECG changes.   COLONOSCOPY N/A 12/27/2012   Procedure: COLONOSCOPY;  Surgeon: Connor HoustonMD;  Location: [facility];  Service: Endoscopy;  Laterality: N/A;  730   CORONARY ANGIOPLASTY     2 stents   RESECTION DISTAL CLAVICAL Left 06/13/2013   Procedure: RESECTION DISTAL CLAVICAL;  Surgeon: Connor CivilMD;  Location: [facility];  Service: Orthopedics;  Laterality: Left;   SHOULDER OPEN ROTATOR CUFF REPAIR Left 06/13/2013   Procedure: ROTATOR CUFF REPAIR SHOULDER OPEN;  Surgeon: Connor CivilMD;  Location: [facility];  Service: Orthopedics;  Laterality: Left;   TRANSTHORACIC ECHOCARDIOGRAM  04/16/2009   EF >55%, no significant valvular abnormalities.    Allergies  No Known Allergies  History of Present  Illness    [name] a past medical history of hyperlipidemia, family history of CAD, STEMI 11/10 received 2 overlapping bare-metal stents to his second diagonal.  Echocardiogram 2/11 showed normal LV function.  His nuclear stress test showed inferior apical scar without ischemia.  A repeat nuclear stress test 3/15 showed a small area of apical scar with preserved ejection fraction.  PMH also includes left rotator cuff tear status post repair.  His last seen by Connor Sanford 01/05/2019.  During that time he remained stable.  He denied chest pain or shortness of breath.  Contacted nurse triage line on 10/19/2019.  He indicated that he is having shortness of breath intermittently.  He also noted that it was apparent sometimes with increased physical activity.  He presents to the clinic today for follow-up evaluation states in the last 3 weeks he has noticed he has become increasingly short of breath with activity.  He states that he has been out of work since February due to a right shoulder injury.  He states that he has remained active however, he has gained about 6 pounds.  He has not had to take any of his nitroglycerin and has refilled his prescription in case he needs it.  I will order an echocardiogram for further evaluation, give him salty 6 diet sheet, and have him follow-up in 1 month.  Today he denies chest pain, shortness of breath, lower extremity edema, fatigue, palpitations, melena, hematuria, hemoptysis, diaphoresis, weakness, presyncope, syncope, orthopnea, and PND.   Home Medications    Prior to Admission medications   Medication Sig Start Date End  Date Taking? Authorizing Provider  ALPRAZolam Connor Sanford) 1 MG tablet TAKE ONE TABLET (1MG TOTAL) BY MOUTH AT BEDTIME AS NEEDED FOR SLEEP 01/16/19   Connor Kirschner, MD  aspirin EC 81 MG tablet Take 81 mg by mouth daily.      [provider]  atorvastatin (LIPITOR) 40 MG tablet TAKE 1 TABLET BY MOUTH DAILY AT 6 PM 02/26/19    Connor Harp, MD  clopidogrel (PLAVIX) 75 MG tablet TAKE ONE (1) TABLET BY MOUTH EVERY DAY 02/26/19   Connor Harp, MD  diphenhydramine-acetaminophen (TYLENOL PM) 25-500 MG TABS Take 1 tablet by mouth at bedtime as needed (for sleep). Reported on 05/01/2015    [provider]  fexofenadine-pseudoephedrine (ALLEGRA-D) 60-120 MG per tablet Take 1 tablet by mouth 2 (two) times daily.    [provider]  metoprolol tartrate (LOPRESSOR) 25 MG tablet TAKE ONE-HALF TABLET (12.5MG TOTAL) BY MOUTH TWO TIMES DAILY 02/26/19   Connor Harp, MD  Multiple Vitamins-Minerals (MULTIVITAMIN WITH IRON-MINERALS) liquid Take 30 mLs by mouth daily.      [provider]  nitroGLYCERIN (NITROSTAT) 0.4 MG SL tablet Place 0.4 mg under the tongue every 5 (five) minutes as needed for chest pain. Patient not taking: Reported on 10/29/2019    [provider]    Family History    Family History  Problem Relation Age of Onset   Heart disease Father    Heart disease Paternal Grandmother    Colon cancer Neg Hx    He indicated that the status of his father is unknown. He indicated that the status of his paternal grandmother is unknown. He indicated that the status of his neg hx is unknown.  Social History    Social History   Socioeconomic History   Marital status: Married    Spouse name: Not on file   Number of children: Not on file   Years of education: Not on file   Highest education level: Not on file  Occupational History   Not on file  Tobacco Use   Smoking status: Never Smoker   Smokeless tobacco: Never Used  Substance and Sexual Activity   Alcohol use: No   Drug use: No   Sexual activity: Yes    Birth control/protection: None  Other Topics Concern   Not on file  Social History Narrative   Not on file   Social Determinants of Health   Financial Resource Strain:    Difficulty of Paying Living Expenses: Not on file  Food Insecurity:      Worried About Gerty in the Last Year: Not on file   [employer] of Food in the Last Year: Not on file  Transportation Needs:    Lack of Transportation (Medical): Not on file   Lack of Transportation (Non-Medical): Not on file  Physical Activity:    Days of Exercise per Week: Not on file   Minutes of Exercise per Session: Not on file  Stress:    Feeling of Stress : Not on file  Social Connections:    Frequency of Communication with Friends and Family: Not on file   Frequency of Social Gatherings with Friends and Family: Not on file   Attends Religious Services: Not on file   Active Member of Clubs or Organizations: Not on file   Attends Archivist Meetings: Not on file   Marital Status: Not on file  Intimate Partner Violence:    Fear of Current or Ex-Partner: Not  on file   Emotionally Abused: Not on file   Physically Abused: Not on file   Sexually Abused: Not on file     Review of Systems    General:  No chills, fever, night sweats or weight changes.  Cardiovascular:  No chest pain, dyspnea on exertion, edema, orthopnea, palpitations, paroxysmal nocturnal dyspnea. Dermatological: No rash, lesions/masses Respiratory: No cough, dyspnea Urologic: No hematuria, dysuria Abdominal:   No nausea, vomiting, diarrhea, bright red blood per rectum, melena, or hematemesis Neurologic:  No visual changes, wkns, changes in mental status. All other systems reviewed and are otherwise negative except as noted above.  Physical Exam    VS:  BP 122/78    Pulse 69    Temp 97.8 F (36.6 C)    Ht 5' 10"  (1.778 m)    Wt 230 lb 3.2 oz (104.4 kg)    SpO2 98%    BMI 33.03 kg/m  , BMI Body mass index is 33.03 kg/m. GEN: Well nourished, well developed, in no acute distress. HEENT: normal. Neck: Supple, no JVD, carotid bruits, or masses. Cardiac: RRR, no murmurs, rubs, or gallops. No clubbing, cyanosis, edema.  Radials/DP/PT 2+ and equal bilaterally.  Respiratory:   Respirations regular and unlabored, clear to auscultation bilaterally. GI: Soft, nontender, nondistended, BS + x 4. MS: no deformity or atrophy. Skin: warm and dry, no rash. Neuro:  Strength and sensation are intact. Psych: Normal affect.  Accessory Clinical Findings    Recent Labs: 01/02/2019: ALT 27; BUN 18; Creatinine, Ser 1.14; Potassium 4.7; Sodium 139   Recent Lipid Panel    Component Value Date/Time   CHOL 113 01/02/2019 0804   TRIG 58 01/02/2019 0804   HDL 38 (L) 01/02/2019 0804   CHOLHDL 3.0 01/02/2019 0804   CHOLHDL 3.8 10/20/2012 0712   VLDL 17 10/20/2012 0712   LDLCALC 62 01/02/2019 0804    ECG personally reviewed by me today-normal sinus rhythm right axis deviation 69 bpm- No acute changes  EKG 01/05/2019 Sinus bradycardia rightward axis deviation 59 bpm no ST or T wave deviation  Echocardiogram 04/16/2009 LVEF greater than 55%, no valvular abnormalities  Nuclear stress test 05/23/2013 Impression Exercise Capacity:  Lexiscan with no exercise. BP Response:  Normal blood pressure response. Clinical Symptoms:  No significant symptoms noted. ECG Impression:  No significant ST segment change suggestive of ischemia. Comparison with Prior Nuclear Study: No significant change from previous study  Overall Impression:  Low risk stress nuclear study Small antero apical scar w/o ischemia.   Assessment & Plan   1.  Shortness of breath/DOE -notices intermittent periods of DOE with  activity.  Echocardiogram 2/11 showed normal EF and no valvular abnormalities.  Underwent cardiac catheterization with BMS x2 to his second diagonal 11/10. Heart healthy low-sodium diet-salty 6 given Increase physical activity as tolerated Repeat echocardiogram  Coronary artery disease-no chest pain today.  Stress test 3/15 showed Low risk stress nuclear study Small antero apical scar w/o ischemia. Continue atorvastatin, aspirin, clopidogrel, metoprolol, nitroglycerin Heart healthy  low-sodium diet-salty 6 given Increase physical activity as tolerated   Hyperlipidemia-01/02/2019: Cholesterol, Total 113; HDL 38; LDL Chol Calc (NIH) 62; Triglycerides 58 Continue atorvastatin Heart healthy low-sodium diet-salty 6 given Increase physical activity as tolerated   Disposition: Follow-up with Dr. Gwenlyn Found or me in 1 month.  Jossie Ng. Lyndall Bellot NP-C    11/09/2019, 9:10 AM [place] [address] Office [phone] Fax [phone]  Notice: This dictation was prepared with Dragon dictation along  with smaller phrase technology. Any transcriptional errors that result from this process are unintentional and may not be corrected upon review.

## 2019-11-09 ENCOUNTER — Other Ambulatory Visit: Payer: Self-pay

## 2019-11-09 ENCOUNTER — Encounter: Payer: Self-pay | Admitting: General Practice

## 2019-11-09 ENCOUNTER — Ambulatory Visit (INDEPENDENT_AMBULATORY_CARE_PROVIDER_SITE_OTHER): Payer: PRIVATE HEALTH INSURANCE | Admitting: General Practice

## 2019-11-09 VITALS — BP 122/78 | HR 69 | Temp 97.8°F | Ht 70.0 in | Wt 230.2 lb

## 2019-11-09 DIAGNOSIS — E7800 Pure hypercholesterolemia, unspecified: Secondary | ICD-10-CM

## 2019-11-09 DIAGNOSIS — R0609 Other forms of dyspnea: Secondary | ICD-10-CM

## 2019-11-09 DIAGNOSIS — R0600 Dyspnea, unspecified: Secondary | ICD-10-CM | POA: Diagnosis not present

## 2019-11-09 DIAGNOSIS — I2510 Atherosclerotic heart disease of native coronary artery without angina pectoris: Secondary | ICD-10-CM

## 2019-11-09 NOTE — Patient Instructions (Signed)
Medication Instructions:  Continue current medications  *If you need a refill on your cardiac medications before your next appointment, please call your pharmacy*   Lab Work: None Ordered   Testing/Procedures: Your physician has requested that you have an echocardiogram. Echocardiography is a painless test that uses sound waves to create images of your heart. It provides your doctor with information about the size and shape of your heart and how well your heart's chambers and valves are working. This procedure takes approximately one hour. There are no restrictions for this procedure.  Follow-Up: At [facility], you and your health needs are our priority.  As part of our continuing mission to provide you with exceptional heart care, we have created designated Provider Care Teams.  These Care Teams include your primary Cardiologist (physician) and Advanced Practice Providers (APPs -  Physician Assistants and Nurse Practitioners) who all work together to provide you with the care you need, when you need it.  We recommend signing up for the patient portal called "MyChart".  Sign up information is provided on this After Visit Summary.  MyChart is used to connect with patients for Virtual Visits (Telemedicine).  Patients are able to view lab/test results, encounter notes, upcoming appointments, etc.  Non-urgent messages can be sent to your provider as well.   To learn more about what you can do with MyChart, go to [email or web address].    Your next appointment:   1 month(s)  The format for your next appointment:   In Person  Provider:   You may see Quay Burow, MD or one of the following Advanced Practice Providers on your designated Care Team:    Kerin Ransom, PA-C  [place], [place]  Coletta Memos, [place]

## 2019-11-15 ENCOUNTER — Ambulatory Visit: Payer: PRIVATE HEALTH INSURANCE | Admitting: Orthopedic Surgery

## 2019-11-19 ENCOUNTER — Other Ambulatory Visit: Payer: Self-pay

## 2019-11-19 ENCOUNTER — Ambulatory Visit (INDEPENDENT_AMBULATORY_CARE_PROVIDER_SITE_OTHER): Payer: PRIVATE HEALTH INSURANCE | Admitting: Orthopedic Surgery

## 2019-11-19 ENCOUNTER — Encounter: Payer: Self-pay | Admitting: Orthopedic Surgery

## 2019-11-19 VITALS — BP 135/73 | HR 62 | Ht 70.0 in | Wt 230.0 lb

## 2019-11-19 DIAGNOSIS — M7021 Olecranon bursitis, right elbow: Secondary | ICD-10-CM | POA: Diagnosis not present

## 2019-11-19 NOTE — Progress Notes (Signed)
Chief Complaint  Patient presents with  . Elbow Problem    right/ swelling    64 recurrent swelling right elbow on Plavix having some difficulties with his heart at present can have surgery  We aspirated his right elbow with his permission  Sterile technique was used elbow was prepped and draped 18-gauge needle was used with a 10 cc syringe and approximately 50 cc of blood-tinged fluid was removed patient will follow up on a as needed basis to get it aspirated  We did put a compressive wrap on it to keep some pressure on it  Encounter Diagnosis  Name Primary?  Marland Kitchen Olecranon bursitis, right elbow Yes

## 2019-11-20 ENCOUNTER — Ambulatory Visit (HOSPITAL_COMMUNITY): Payer: PRIVATE HEALTH INSURANCE | Attending: Cardiovascular Disease

## 2019-11-20 DIAGNOSIS — R0609 Other forms of dyspnea: Secondary | ICD-10-CM

## 2019-11-20 DIAGNOSIS — I2510 Atherosclerotic heart disease of native coronary artery without angina pectoris: Secondary | ICD-10-CM | POA: Insufficient documentation

## 2019-11-20 DIAGNOSIS — R0600 Dyspnea, unspecified: Secondary | ICD-10-CM

## 2019-11-20 LAB — ECHOCARDIOGRAM COMPLETE
Area-P 1/2: 3.39 cm2
S' Lateral: 3.1 cm

## 2019-12-19 NOTE — H&P (View-Only) (Signed)
Cardiology Clinic Note   Patient Name: Connor Sanford Date of Encounter: 12/24/2019  Primary Care Provider:  Mikey Kirschner, MD (Inactive) Primary Cardiologist:  Quay Burow, MD  Patient Profile    [name]. [place] 64 year old male presents today for an follow-up evaluation of his shortness of breath.  Past Medical History    Past Medical History:  Diagnosis Date  . Coronary artery disease   . H/O seasonal allergies   . Hyperlipidemia   . Hypertension   . MI (myocardial infarction) ([place]) 2010   Past Surgical History:  Procedure Laterality Date  . CARDIAC CATHETERIZATION  01/27/2009   Occluded second diagonal branch-stented with a 2.25x63m Integrity Medtronic bare-metal stent at 12 atm (2.38-mm) resulting in reduction of 100% stenosis to 0% residual. Small fairly focal stenosis just distalof stented segment-stented with a 2.25x853mIntegrity Medtronic bare-metal stent at 12 atm, reduced to 0% residual with TIMI3 flow.  . Marland KitchenARDIOVASCULAR STRESS TEST  04/16/2009   Mild perfusion defect seen in Mid Anterior, Apical Anterior, and Apical regions - consistent with infarct/scar. No scintigraphic evidence of inducible myocardial ischemia. EKG negative for ischemia. No ECG changes.  . COLONOSCOPY N/A 12/27/2012   Procedure: COLONOSCOPY;  Surgeon: NaRogene HoustonMD;  [facility];  Service: Endoscopy;  Laterality: N/A;  730  . CORONARY ANGIOPLASTY     2 stents  . RESECTION DISTAL CLAVICAL Left 06/13/2013   Procedure: RESECTION DISTAL CLAVICAL;  Surgeon: StCarole CivilMD;  Location: [facility];  Service: Orthopedics;  Laterality: Left;  . SHOULDER OPEN ROTATOR CUFF REPAIR Left 06/13/2013   Procedure: ROTATOR CUFF REPAIR SHOULDER OPEN;  Surgeon: StCarole CivilMD;  Location: [facility];  Service: Orthopedics;  Laterality: Left;  . TRANSTHORACIC ECHOCARDIOGRAM  04/16/2009   EF >55%, no significant valvular abnormalities.    Allergies  No Known  Allergies  History of Present Illness    Mr. WiPfalzgrafas a past medical history of hyperlipidemia, family history of CAD, STEMI 11/10 received 2 overlapping bare-metal stents to his second diagonal.  Echocardiogram 2/11 showed normal LV function.  His nuclear stress test showed inferior apical scar without ischemia.  A repeat nuclear stress test 3/15 showed a small area of apical scar with preserved ejection fraction.  PMH also includes left rotator cuff tear status post repair.  His last seen by Dr. BeGwenlyn Foundn 01/05/2019.  During that time he remained stable.  He denied chest pain or shortness of breath.  Contacted nurse triage line on 10/19/2019.  He indicated that he is having shortness of breath intermittently.  He also noted that it was apparent sometimes with increased physical activity.  He presented to the clinic 11/09/2019 for follow-up evaluation stated in the last 3 weeks he has noticed he had become increasingly short of breath with activity.  He stated that he has been out of work since February due to a right shoulder injury.  He stated that he had remained active however, he had gained about 6 pounds.  He had not had to take any of his nitroglycerin and had refilled his prescription in case he needed it.  I ordered an echocardiogram for further evaluation, gave him salty 6 diet sheet, and have him follow-up in 1 month.  Echocardiogram showed normal EF and intermediate diastolic parameters.  No significant valvular abnormalities were noted.  He presents to the clinic today for follow-up evaluation and states he continues to have dyspnea with exertion.  He was mowing his lawn  with his push mower prior to coming to the appointment and noticed that he walked faster than a certain pace he would develop chest discomfort and DOE.  He has also noticed that if he walks on the treadmill at 2.5 miles an hour he does not have chest discomfort but if he increases his speed to 3.5 mph he develops pain.   I will order a nuclear stress test and have him follow-up in 1 month.  Today he denies chest pain, shortness of breath, lower extremity edema, fatigue, palpitations, melena, hematuria, hemoptysis, diaphoresis, weakness, presyncope, syncope, orthopnea, and PND.  Home Medications    Prior to Admission medications   Medication Sig Start Date End Date Taking? Authorizing Provider  ALPRAZolam Duanne Moron) 1 MG tablet TAKE ONE TABLET (1MG TOTAL) BY MOUTH AT BEDTIME AS NEEDED FOR SLEEP 01/16/19   Mikey Kirschner, MD  aspirin EC 81 MG tablet Take 81 mg by mouth daily.      [provider]  atorvastatin (LIPITOR) 40 MG tablet TAKE 1 TABLET BY MOUTH DAILY AT 6 PM 02/26/19   [name], MD  clopidogrel (PLAVIX) 75 MG tablet TAKE ONE (1) TABLET BY MOUTH EVERY DAY 02/26/19   [name], MD  diphenhydramine-acetaminophen (TYLENOL PM) 25-500 MG TABS Take 1 tablet by mouth at bedtime as needed (for sleep). Reported on 05/01/2015    [provider]  fexofenadine-pseudoephedrine (ALLEGRA-D) 60-120 MG per tablet Take 1 tablet by mouth 2 (two) times daily.    [provider]  metoprolol tartrate (LOPRESSOR) 25 MG tablet TAKE ONE-HALF TABLET (12.5MG TOTAL) BY MOUTH TWO TIMES DAILY 02/26/19   [name], MD  Multiple Vitamins-Minerals (MULTIVITAMIN WITH IRON-MINERALS) liquid Take 30 mLs by mouth daily.      [provider]  nitroGLYCERIN (NITROSTAT) 0.4 MG SL tablet Place 0.4 mg under the tongue every 5 (five) minutes as needed for chest pain.     [provider]    Family History    Family History  Problem Relation Age of Onset  . Heart disease Father   . Heart disease Paternal Grandmother   . Colon cancer Neg Hx    He indicated that the status of his father is unknown. He indicated that the status of his paternal grandmother is unknown. He indicated that the status of his neg hx is unknown.  Social History    Social History   Socioeconomic  History  . Marital status: Married    Spouse name: Not on file  . Number of children: Not on file  . Years of education: Not on file  . Highest education level: Not on file  Occupational History  . Not on file  Tobacco Use  . Smoking status: Never Smoker  . Smokeless tobacco: Never Used  Substance and Sexual Activity  . Alcohol use: No  . Drug use: No  . Sexual activity: Yes    Birth control/protection: None  Other Topics Concern  . Not on file  Social History Narrative  . Not on file   Social Determinants of Health   Financial Resource Strain:   . Difficulty of Paying Living Expenses: Not on file  Food Insecurity:   . Worried About Charity fundraiser in the Last Year: Not on file  . Ran Out of Food in the Last Year: Not on file  Transportation Needs:   . Lack of Transportation (Medical): Not on file  . Lack of Transportation (Non-Medical): Not on file  Physical  Activity:   . Days of Exercise per Week: Not on file  . Minutes of Exercise per Session: Not on file  Stress:   . Feeling of Stress : Not on file  Social Connections:   . Frequency of Communication with Friends and Family: Not on file  . Frequency of Social Gatherings with Friends and Family: Not on file  . Attends Religious Services: Not on file  . Active Member of Clubs or Organizations: Not on file  . Attends Archivist Meetings: Not on file  . Marital Status: Not on file  Intimate Partner Violence:   . Fear of Current or Ex-Partner: Not on file  . Emotionally Abused: Not on file  . Physically Abused: Not on file  . Sexually Abused: Not on file     Review of Systems    General:  No chills, fever, night sweats or weight changes.  Cardiovascular:  No chest pain, dyspnea on exertion, edema, orthopnea, palpitations, paroxysmal nocturnal dyspnea. Dermatological: No rash, lesions/masses Respiratory: No cough, dyspnea Urologic: No hematuria, dysuria Abdominal:   No nausea, vomiting, diarrhea,  bright red blood per rectum, melena, or hematemesis Neurologic:  No visual changes, wkns, changes in mental status. All other systems reviewed and are otherwise negative except as noted above.  Physical Exam    VS:  BP 130/68 (BP Location: Left Arm, Patient Position: Sitting, Cuff Size: Large)   Pulse 63   Ht 5' 10"  (1.778 m)   Wt 229 lb 12.8 oz (104.2 kg)   SpO2 97%   BMI 32.97 kg/m  , BMI Body mass index is 32.97 kg/m. GEN: Well nourished, well developed, in no acute distress. HEENT: normal. Neck: Supple, no JVD, carotid bruits, or masses. Cardiac: RRR, no murmurs, rubs, or gallops. No clubbing, cyanosis, edema.  Radials/DP/PT 2+ and equal bilaterally.  Respiratory:  Respirations regular and unlabored, clear to auscultation bilaterally. GI: Soft, nontender, nondistended, BS + x 4. MS: no deformity or atrophy. Skin: warm and dry, no rash. Neuro:  Strength and sensation are intact. Psych: Normal affect.  Accessory Clinical Findings    Recent Labs: 01/02/2019: ALT 27; BUN 18; Creatinine, Ser 1.14; Potassium 4.7; Sodium 139   Recent Lipid Panel    Component Value Date/Time   CHOL 113 01/02/2019 0804   TRIG 58 01/02/2019 0804   HDL 38 (L) 01/02/2019 0804   CHOLHDL 3.0 01/02/2019 0804   CHOLHDL 3.8 10/20/2012 0712   VLDL 17 10/20/2012 0712   LDLCALC 62 01/02/2019 0804    ECG personally reviewed by me today-none today.  EKG 11/09/2019 normal sinus rhythm right axis deviation 69 bpm- No acute changes  EKG 01/05/2019 Sinus bradycardia rightward axis deviation 59 bpm no ST or T wave deviation  Nuclear stress test 05/23/2013 Impression Exercise Capacity: Lexiscan with no exercise. BP Response: Normal blood pressure response. Clinical Symptoms: No significant symptoms noted. ECG Impression: No significant ST segment change suggestive of ischemia. Comparison with Prior Nuclear Study: No significant change from previous study  Overall Impression: Low risk stress  nuclear study Small antero apical scar w/o ischemia.  Echocardiogram 11/20/2019 Left ventricular ejection fraction, by estimation, is 60 to 65%. The  left ventricle has normal function. The left ventricle has no regional  wall motion abnormalities. Left ventricular diastolic parameters are  indeterminate.  2. Right ventricular systolic function is normal. The right ventricular  size is normal.  3. The mitral valve is normal in structure. Trivial mitral valve  regurgitation.  4. The aortic valve  is normal in structure. Aortic valve regurgitation is  not visualized. No aortic stenosis is present.   Assessment & Plan   1.  Shortness of breath/DOE/chest pain -continues to notice intermittent periods of DOE with  activity.  Follow-up echocardiogram 11/20/2019 showed normal EF and intermediate diastolic parameters with no significant valvular abnormalities.  Underwent cardiac catheterization with BMS x2 to his second diagonal 11/10.  Seems to be related to deconditioning and increased weight gain. Heart healthy low-sodium diet-salty 6 given Increase physical activity as tolerated Order nuclear stress test  Coronary artery disease-continues with no chest pain today.  Stress test 3/15 showed Low risk stress nuclear study Small antero apical scar w/o ischemia. Continue atorvastatin, aspirin, clopidogrel, metoprolol, nitroglycerin Heart healthy low-sodium diet-salty 6 given Increase physical activity as tolerated   Hyperlipidemia-01/02/2019: Cholesterol, Total 113; HDL 38; LDL Chol Calc (NIH) 62; Triglycerides 58 Continue atorvastatin Heart healthy low-sodium diet-salty 6 given Increase physical activity as tolerated   Disposition: Follow-up with Dr. Gwenlyn Found or me in 1 months.   Jossie Ng. Ailana Cuadrado NP-C    12/24/2019, 4:08 PM [facility] [place] [address] Office [phone] Fax [phone]  Notice: This dictation was prepared with Dragon  dictation along with smaller phrase technology. Any transcriptional errors that result from this process are unintentional and may not be corrected upon review.

## 2019-12-19 NOTE — Progress Notes (Signed)
Cardiology Clinic Note   Patient Name: Connor Sanford Date of Encounter: 12/24/2019  Primary Care Provider:  Mikey Kirschner, Sanford (Inactive) Primary Cardiologist:  Connor Burow, Sanford  Patient Profile    Connor Sanford 64 year old male presents today for an follow-up evaluation of his shortness of breath.  Past Medical History    Past Medical History:  Diagnosis Date  . Coronary artery disease   . H/O seasonal allergies   . Hyperlipidemia   . Hypertension   . MI (myocardial infarction) ([place]) 2010   Past Surgical History:  Procedure Laterality Date  . CARDIAC CATHETERIZATION  01/27/2009   Occluded second diagonal branch-stented with a 2.25x66m Integrity Medtronic bare-metal stent at 12 atm (2.38-mm) resulting in reduction of 100% stenosis to 0% residual. Small fairly focal stenosis just distalof stented segment-stented with a 2.25x839mIntegrity Medtronic bare-metal stent at 12 atm, reduced to 0% residual with TIMI3 flow.  . Marland KitchenARDIOVASCULAR STRESS TEST  04/16/2009   Mild perfusion defect seen in Mid Anterior, Apical Anterior, and Apical regions - consistent with infarct/scar. No scintigraphic evidence of inducible myocardial ischemia. EKG negative for ischemia. No ECG changes.  . COLONOSCOPY N/A 12/27/2012   Procedure: COLONOSCOPY;  Surgeon: Connor Sanford;  [facility];  Service: Endoscopy;  Laterality: N/A;  730  . CORONARY ANGIOPLASTY     2 stents  . RESECTION DISTAL CLAVICAL Left 06/13/2013   Procedure: RESECTION DISTAL CLAVICAL;  Surgeon: Connor Sanford;  Location: [facility];  Service: Orthopedics;  Laterality: Left;  . SHOULDER OPEN ROTATOR CUFF REPAIR Left 06/13/2013   Procedure: ROTATOR CUFF REPAIR SHOULDER OPEN;  Surgeon: Connor Sanford;  Location: [facility];  Service: Orthopedics;  Laterality: Left;  . TRANSTHORACIC ECHOCARDIOGRAM  04/16/2009   EF >55%, no significant valvular abnormalities.    Allergies  No Known  Allergies  History of Present Illness    [name] a past medical history of hyperlipidemia, family history of CAD, STEMI 11/10 received 2 overlapping bare-metal stents to his second diagonal.  Echocardiogram 2/11 showed normal LV function.  His nuclear stress test showed inferior apical scar without ischemia.  A repeat nuclear stress test 3/15 showed a small area of apical scar with preserved ejection fraction.  PMH also includes left rotator cuff tear status post repair.  His last seen by Connor Sanford 01/05/2019.  During that time he remained stable.  He denied chest pain or shortness of breath.  Contacted nurse triage line on 10/19/2019.  He indicated that he is having shortness of breath intermittently.  He also noted that it was apparent sometimes with increased physical activity.  He presented to the clinic 11/09/2019 for follow-up evaluation stated in the last 3 weeks he has noticed he had become increasingly short of breath with activity.  He stated that he has been out of work since February due to a right shoulder injury.  He stated that he had remained active however, he had gained about 6 pounds.  He had not had to take any of his nitroglycerin and had refilled his prescription in case he needed it.  I ordered an echocardiogram for further evaluation, gave him salty 6 diet sheet, and have him follow-up in 1 month.  Echocardiogram showed normal EF and intermediate diastolic parameters.  No significant valvular abnormalities were noted.  He presents to the clinic today for follow-up evaluation and states he continues to have dyspnea with exertion.  He was mowing his lawn  with his push mower prior to coming to the appointment and noticed that he walked faster than a certain pace he would develop chest discomfort and DOE.  He has also noticed that if he walks on the treadmill at 2.5 miles an hour he does not have chest discomfort but if he increases his speed to 3.5 mph he develops pain.   I will order a nuclear stress test and have him follow-up in 1 month.  Today he denies chest pain, shortness of breath, lower extremity edema, fatigue, palpitations, melena, hematuria, hemoptysis, diaphoresis, weakness, presyncope, syncope, orthopnea, and PND.  Home Medications    Prior to Admission medications   Medication Sig Start Date End Date Taking? Authorizing Provider  Connor Connor Sanford) 1 MG tablet TAKE ONE TABLET (1MG TOTAL) BY MOUTH AT BEDTIME AS NEEDED FOR SLEEP 01/16/19   Connor Kirschner, Sanford  aspirin EC 81 MG tablet Take 81 mg by mouth daily.      Provider, Historical, Sanford  atorvastatin (LIPITOR) 40 MG tablet TAKE 1 TABLET BY MOUTH DAILY AT 6 PM 02/26/19   Connor Sanford  clopidogrel (PLAVIX) 75 MG tablet TAKE ONE (1) TABLET BY MOUTH EVERY DAY 02/26/19   Connor Sanford  diphenhydramine-acetaminophen (TYLENOL PM) 25-500 MG TABS Take 1 tablet by mouth at bedtime as needed (for sleep). Reported on 05/01/2015    Provider, Historical, Sanford  fexofenadine-pseudoephedrine (ALLEGRA-D) 60-120 MG per tablet Take 1 tablet by mouth 2 (two) times daily.    Provider, Historical, Sanford  metoprolol tartrate (LOPRESSOR) 25 MG tablet TAKE ONE-HALF TABLET (12.5MG TOTAL) BY MOUTH TWO TIMES DAILY 02/26/19   Connor Sanford  Multiple Vitamins-Minerals (MULTIVITAMIN WITH IRON-MINERALS) liquid Take 30 mLs by mouth daily.      Provider, Historical, Sanford  nitroGLYCERIN (NITROSTAT) 0.4 MG SL tablet Place 0.4 mg under the tongue every 5 (five) minutes as needed for chest pain.     Provider, Historical, Sanford    Family History    Family History  Problem Relation Age of Onset  . Heart disease Father   . Heart disease Paternal Grandmother   . Colon cancer Neg Hx    He indicated that the status of his father is unknown. He indicated that the status of his paternal grandmother is unknown. He indicated that the status of his neg hx is unknown.  Social History    Social History   Socioeconomic  History  . Marital status: Married    Spouse name: Not on file  . Number of children: Not on file  . Years of education: Not on file  . Highest education level: Not on file  Occupational History  . Not on file  Tobacco Use  . Smoking status: Never Smoker  . Smokeless tobacco: Never Used  Substance and Sexual Activity  . Alcohol use: No  . Drug use: No  . Sexual activity: Yes    Birth control/protection: None  Other Topics Concern  . Not on file  Social History Narrative  . Not on file   Social Determinants of Health   Financial Resource Strain:   . Difficulty of Paying Living Expenses: Not on file  Food Insecurity:   . Worried About Charity fundraiser in the Last Year: Not on file  . Ran Out of Food in the Last Year: Not on file  Transportation Needs:   . Lack of Transportation (Medical): Not on file  . Lack of Transportation (Non-Medical): Not on file  Physical  Activity:   . Days of Exercise per Week: Not on file  . Minutes of Exercise per Session: Not on file  Stress:   . Feeling of Stress : Not on file  Social Connections:   . Frequency of Communication with Friends and Family: Not on file  . Frequency of Social Gatherings with Friends and Family: Not on file  . Attends Religious Services: Not on file  . Active Member of Clubs or Organizations: Not on file  . Attends Archivist Meetings: Not on file  . Marital Status: Not on file  Intimate Partner Violence:   . Fear of Current or Ex-Partner: Not on file  . Emotionally Abused: Not on file  . Physically Abused: Not on file  . Sexually Abused: Not on file     Review of Systems    General:  No chills, fever, night sweats or weight changes.  Cardiovascular:  No chest pain, dyspnea on exertion, edema, orthopnea, palpitations, paroxysmal nocturnal dyspnea. Dermatological: No rash, lesions/masses Respiratory: No cough, dyspnea Urologic: No hematuria, dysuria Abdominal:   No nausea, vomiting, diarrhea,  bright red blood per rectum, melena, or hematemesis Neurologic:  No visual changes, wkns, changes in mental status. All other systems reviewed and are otherwise negative except as noted above.  Physical Exam    VS:  BP 130/68 (BP Location: Left Arm, Patient Position: Sitting, Cuff Size: Large)   Pulse 63   Ht 5' 10"  (1.778 m)   Wt 229 lb 12.8 oz (104.2 kg)   SpO2 97%   BMI 32.97 kg/m  , BMI Body mass index is 32.97 kg/m. GEN: Well nourished, well developed, in no acute distress. HEENT: normal. Neck: Supple, no JVD, carotid bruits, or masses. Cardiac: RRR, no murmurs, rubs, or gallops. No clubbing, cyanosis, edema.  Radials/DP/PT 2+ and equal bilaterally.  Respiratory:  Respirations regular and unlabored, clear to auscultation bilaterally. GI: Soft, nontender, nondistended, BS + x 4. MS: no deformity or atrophy. Skin: warm and dry, no rash. Neuro:  Strength and sensation are intact. Psych: Normal affect.  Accessory Clinical Findings    Recent Labs: 01/02/2019: ALT 27; BUN 18; Creatinine, Ser 1.14; Potassium 4.7; Sodium 139   Recent Lipid Panel    Component Value Date/Time   CHOL 113 01/02/2019 0804   TRIG 58 01/02/2019 0804   HDL 38 (L) 01/02/2019 0804   CHOLHDL 3.0 01/02/2019 0804   CHOLHDL 3.8 10/20/2012 0712   VLDL 17 10/20/2012 0712   LDLCALC 62 01/02/2019 0804    ECG personally reviewed by me today-none today.  EKG 11/09/2019 normal sinus rhythm right axis deviation 69 bpm- No acute changes  EKG 01/05/2019 Sinus bradycardia rightward axis deviation 59 bpm no ST or T wave deviation  Nuclear stress test 05/23/2013 Impression Exercise Capacity: Lexiscan with no exercise. BP Response: Normal blood pressure response. Clinical Symptoms: No significant symptoms noted. ECG Impression: No significant ST segment change suggestive of ischemia. Comparison with Prior Nuclear Study: No significant change from previous study  Overall Impression: Low risk stress  nuclear study Small antero apical scar w/o ischemia.  Echocardiogram 11/20/2019 Left ventricular ejection fraction, by estimation, is 60 to 65%. The  left ventricle has normal function. The left ventricle has no regional  wall motion abnormalities. Left ventricular diastolic parameters are  indeterminate.  2. Right ventricular systolic function is normal. The right ventricular  size is normal.  3. The mitral valve is normal in structure. Trivial mitral valve  regurgitation.  4. The aortic valve  is normal in structure. Aortic valve regurgitation is  not visualized. No aortic stenosis is present.   Assessment & Plan   1.  Shortness of breath/DOE/chest pain -continues to notice intermittent periods of DOE with  activity.  Follow-up echocardiogram 11/20/2019 showed normal EF and intermediate diastolic parameters with no significant valvular abnormalities.  Underwent cardiac catheterization with BMS x2 to his second diagonal 11/10.  Seems to be related to deconditioning and increased weight gain. Heart healthy low-sodium diet-salty 6 given Increase physical activity as tolerated Order nuclear stress test  Coronary artery disease-continues with no chest pain today.  Stress test 3/15 showed Low risk stress nuclear study Small antero apical scar w/o ischemia. Continue atorvastatin, aspirin, clopidogrel, metoprolol, nitroglycerin Heart healthy low-sodium diet-salty 6 given Increase physical activity as tolerated   Hyperlipidemia-01/02/2019: Cholesterol, Total 113; HDL 38; LDL Chol Calc (NIH) 62; Triglycerides 58 Continue atorvastatin Heart healthy low-sodium diet-salty 6 given Increase physical activity as tolerated   Disposition: Follow-up with Dr. Gwenlyn Found or me in 1 months.   Jossie Ng. Brylie Sneath NP-C    12/24/2019, 4:08 PM [facility] [place] [address] Office [phone] Fax [phone]  Notice: This dictation was prepared with Dragon  dictation along with smaller phrase technology. Any transcriptional errors that result from this process are unintentional and may not be corrected upon review.

## 2019-12-24 ENCOUNTER — Ambulatory Visit (INDEPENDENT_AMBULATORY_CARE_PROVIDER_SITE_OTHER): Payer: PRIVATE HEALTH INSURANCE | Admitting: General Practice

## 2019-12-24 ENCOUNTER — Encounter: Payer: Self-pay | Admitting: Cardiovascular Disease

## 2019-12-24 ENCOUNTER — Other Ambulatory Visit: Payer: Self-pay

## 2019-12-24 ENCOUNTER — Encounter: Payer: Self-pay | Admitting: General Practice

## 2019-12-24 VITALS — BP 130/68 | HR 63 | Ht 70.0 in | Wt 229.8 lb

## 2019-12-24 DIAGNOSIS — I2510 Atherosclerotic heart disease of native coronary artery without angina pectoris: Secondary | ICD-10-CM

## 2019-12-24 DIAGNOSIS — R079 Chest pain, unspecified: Secondary | ICD-10-CM | POA: Diagnosis not present

## 2019-12-24 DIAGNOSIS — R0609 Other forms of dyspnea: Secondary | ICD-10-CM

## 2019-12-24 DIAGNOSIS — E7800 Pure hypercholesterolemia, unspecified: Secondary | ICD-10-CM

## 2019-12-24 DIAGNOSIS — R0600 Dyspnea, unspecified: Secondary | ICD-10-CM

## 2019-12-24 NOTE — Patient Instructions (Signed)
Medication Instructions:  The current medical regimen is effective;  continue present plan and medications as directed. Please refer to the Current Medication list given to you today.  *If you need a refill on your cardiac medications before your next appointment, please call your pharmacy*  Lab Work: NONE  Testing/Procedures: Your physician has requested that you have a lexiscan myoview.   Special Instructions PLEASE READ AND FOLLOW SALTY 6-ATTACHED  Follow-Up: Your next appointment:  AFTER [facility]  In Person with Quay Burow, MD -OR, IF UNAVAILABLE- JESSE CLEAVER, FNP-C   At [facility], you and your health needs are our priority.  As part of our continuing mission to provide you with exceptional heart care, we have created designated Provider Care Teams.  These Care Teams include your primary Cardiologist (physician) and Advanced Practice Providers (APPs -  Physician Assistants and Nurse Practitioners) who all work together to provide you with the care you need, when you need it.

## 2019-12-27 ENCOUNTER — Encounter: Payer: Self-pay | Admitting: Orthopedic Surgery

## 2019-12-27 ENCOUNTER — Other Ambulatory Visit: Payer: Self-pay

## 2019-12-27 ENCOUNTER — Ambulatory Visit (INDEPENDENT_AMBULATORY_CARE_PROVIDER_SITE_OTHER): Payer: PRIVATE HEALTH INSURANCE | Admitting: Orthopedic Surgery

## 2019-12-27 ENCOUNTER — Encounter (INDEPENDENT_AMBULATORY_CARE_PROVIDER_SITE_OTHER): Payer: Self-pay | Admitting: *Deleted

## 2019-12-27 VITALS — BP 126/78 | HR 72 | Ht 70.0 in | Wt 229.0 lb

## 2019-12-27 DIAGNOSIS — M7021 Olecranon bursitis, right elbow: Secondary | ICD-10-CM

## 2019-12-27 NOTE — Progress Notes (Signed)
Chief Complaint  Patient presents with  . Elbow Pain    right elbow removing fluid.     64 chronic swelling right elbow hemorrhagic bursitis.  Patient is on a blood thinner.  He is getting a cardiac work-up.  Until that time when the fluid comes up and becomes painful he comes in for aspiration  Under sterile conditions with his consent and appropriate timeout we aspirated 27 cc of red blood dark from the right elbow  Compression bandage  Follow-up as needed okay to call anytime he needs to have it aspirated

## 2019-12-28 ENCOUNTER — Ambulatory Visit (HOSPITAL_COMMUNITY)
Admission: RE | Admit: 2019-12-28 | Discharge: 2019-12-28 | Disposition: A | Discharge status: Home / Self Care | Payer: PRIVATE HEALTH INSURANCE | Source: Ambulatory Visit | Attending: Internal Medicine | Admitting: Internal Medicine

## 2019-12-28 DIAGNOSIS — R0600 Dyspnea, unspecified: Secondary | ICD-10-CM | POA: Insufficient documentation

## 2019-12-28 DIAGNOSIS — R079 Chest pain, unspecified: Secondary | ICD-10-CM | POA: Diagnosis not present

## 2019-12-28 DIAGNOSIS — R0609 Other forms of dyspnea: Secondary | ICD-10-CM

## 2019-12-28 LAB — MYOCARDIAL PERFUSION IMAGING
LV dias vol: 107 mL (ref 62–150)
LV sys vol: 46 mL
Peak HR: 85 {beats}/min
Rest HR: 52 {beats}/min
SDS: 2
SRS: 7
SSS: 9
TID: 1.06

## 2019-12-28 MED ORDER — TECHNETIUM TC 99M TETROFOSMIN IV KIT
31.2000 | PACK | Freq: Once | INTRAVENOUS | Status: AC | PRN
  Administered 2019-12-28: 31.2 via INTRAVENOUS
  Filled 2019-12-28: qty 32

## 2019-12-28 MED ORDER — TECHNETIUM TC 99M TETROFOSMIN IV KIT
10.6000 | PACK | Freq: Once | INTRAVENOUS | Status: AC | PRN
  Administered 2019-12-28: 10.6 via INTRAVENOUS
  Filled 2019-12-28: qty 11

## 2019-12-28 MED ORDER — REGADENOSON 0.4 MG/5ML IV SOLN
0.4000 mg | Freq: Once | INTRAVENOUS | Status: AC
  Administered 2019-12-28: 0.4 mg via INTRAVENOUS

## 2019-12-31 ENCOUNTER — Telehealth: Payer: Self-pay

## 2019-12-31 ENCOUNTER — Telehealth: Payer: Self-pay | Admitting: General Practice

## 2019-12-31 DIAGNOSIS — Z01812 Encounter for preprocedural laboratory examination: Secondary | ICD-10-CM

## 2019-12-31 NOTE — Telephone Encounter (Signed)
Called patient to set up heart cath and to go over instructions Per Coletta Memos, NP-C. Advised patient of the following instructions. Left Heart Cath scheduled for Thursday 11/4 at 1:30pm with Dr. Gwenlyn Found. Patient needs to be at [facility] at 11:30am. Patient made aware of the need for labs to be drawn and will come in today to the office to have these done. Covid Testing scheduled for 11/2 at 8:20am at [facility] [place]. Patient made aware to continue taking Aspirin and Plavix including to take it the morning of procedure. Advised patient I would print instructions and leave at the front desk for pick up today when he comes for lab work. Patient verbalized understanding.     [address] [place] [address] Dept: [phone] Loc: [phone]  Connor Sanford  12/31/2019  You are scheduled for a Cardiac Catheterization on Thursday, November 4 with Dr. Quay Burow.  1. Please arrive at the [facility] (Main Entrance A) at [facility]: [address] [place], [place] [address] at 11:30 AM (This time is two hours before your procedure to ensure your preparation). Free valet parking service is available.   Special note: Every effort is made to have your procedure done on time. Please understand that emergencies sometimes delay scheduled procedures.  2. Diet: Do not eat solid foods after midnight.  The patient may have clear liquids until 5am upon the day of the procedure.  3. Labs: You will need to have blood drawn on Monday, November 1 at [facility]  Open: 8am - 5pm (Lunch 12:30 - 1:30)   Phone: [phone]. You do not need to be fasting.  4. Medication instructions in preparation for your procedure:   Contrast Allergy: No  On the morning of your procedure, take your Aspirin/Plavix and any morning medicines NOT listed above.  You  may use sips of water.  5. Plan for one night stay--bring personal belongings. 6. Bring a current list of your medications and current insurance cards. 7. You MUST have a responsible person to drive you home. 8. Someone MUST be with you the first 24 hours after you arrive home or your discharge will be delayed. 9. Please wear clothes that are easy to get on and off and wear slip-on shoes.  Thank you for allowing [place] to care for you!   -- [facility]

## 2019-12-31 NOTE — Telephone Encounter (Signed)
Pt contacted to review stress test results and discuss LHC. He expressed understanding and will be scheduled for Thursday with Dr. Gwenlyn Found this week.   The patient understands that risks include but are not limited to stroke (1 in 1000), death (1 in 74), kidney failure [usually temporary] (1 in 500), bleeding (1 in 200), allergic reaction [possibly serious] (1 in 200), and agrees to proceed.

## 2019-12-31 NOTE — Addendum Note (Signed)
Addended by: Deberah Pelton on: 12/31/2019 01:01 PM   Modules accepted: Orders, SmartSet

## 2020-01-01 ENCOUNTER — Other Ambulatory Visit (HOSPITAL_COMMUNITY)
Admission: RE | Admit: 2020-01-01 | Discharge: 2020-01-01 | Disposition: A | Discharge status: Home / Self Care | Payer: PRIVATE HEALTH INSURANCE | Source: Ambulatory Visit | Attending: Cardiovascular Disease | Admitting: Cardiovascular Disease

## 2020-01-01 DIAGNOSIS — Z01812 Encounter for preprocedural laboratory examination: Secondary | ICD-10-CM | POA: Diagnosis not present

## 2020-01-01 DIAGNOSIS — Z20822 Contact with and (suspected) exposure to covid-19: Secondary | ICD-10-CM | POA: Insufficient documentation

## 2020-01-01 LAB — CBC
Hematocrit: 43.6 % (ref 37.5–51.0)
Hemoglobin: 15.2 g/dL (ref 13.0–17.7)
MCH: 33.6 pg — ABNORMAL HIGH (ref 26.6–33.0)
MCHC: 34.9 g/dL (ref 31.5–35.7)
MCV: 97 fL (ref 79–97)
Platelets: 171 10*3/uL (ref 150–450)
RBC: 4.52 x10E6/uL (ref 4.14–5.80)
RDW: 12.2 % (ref 11.6–15.4)
WBC: 6.2 10*3/uL (ref 3.4–10.8)

## 2020-01-01 LAB — BASIC METABOLIC PANEL
BUN/Creatinine Ratio: 13 (ref 10–24)
BUN: 12 mg/dL (ref 8–27)
CO2: 25 mmol/L (ref 20–29)
Calcium: 9.4 mg/dL (ref 8.6–10.2)
Chloride: 102 mmol/L (ref 96–106)
Creatinine, Ser: 0.93 mg/dL (ref 0.76–1.27)
GFR calc Af Amer: 101 mL/min/{1.73_m2} (ref >59)
GFR calc non Af Amer: 87 mL/min/{1.73_m2} (ref >59)
Glucose: 139 mg/dL — ABNORMAL HIGH (ref 65–99)
Potassium: 4.9 mmol/L (ref 3.5–5.2)
Sodium: 140 mmol/L (ref 134–144)

## 2020-01-01 LAB — SARS CORONAVIRUS 2 (TAT 6-24 HRS): SARS Coronavirus 2: NEGATIVE

## 2020-01-02 ENCOUNTER — Telehealth: Payer: Self-pay | Admitting: *Deleted

## 2020-01-02 NOTE — Telephone Encounter (Addendum)
Pt contacted pre-catheterization scheduled at [facility] for: Thursday January 03, 2020 1:30 PM Verified arrival time and place: [facility]) at: 11: 30 AM    No solid food after midnight prior to cath, clear liquids until 5 AM day of procedure.   AM meds can be  taken pre-cath with sips of water including: ASA 81 mg Plavix 75 mg  Confirmed patient has responsible adult to drive home post procedure and be with patient first 24 hours after arriving home: yes  You are allowed ONE visitor in the waiting room during the time you are at the hospital for your procedure. Both you and your visitor must wear a mask once you enter the hospital.       COVID-19 Pre-Screening Questions:  . In the past 14 days have you had a new cough, new headache, new nasal congestion, fever (100.4 or greater) unexplained body aches, new sore throat, or sudden loss of taste or sense of smell? no . In the past 14 days have you been around anyone with known Covid 19? no    Reviewed procedure/mask/visitor instructions, COVID-19 questions reviewed with patient.

## 2020-01-03 ENCOUNTER — Other Ambulatory Visit: Payer: Self-pay

## 2020-01-03 ENCOUNTER — Ambulatory Visit (HOSPITAL_COMMUNITY)
Admission: RE | Disposition: A | Discharge status: Home / Self Care | Payer: Self-pay | Source: Home / Self Care | Attending: Cardiovascular Disease

## 2020-01-03 ENCOUNTER — Encounter (HOSPITAL_COMMUNITY): Payer: Self-pay | Admitting: Cardiovascular Disease

## 2020-01-03 ENCOUNTER — Ambulatory Visit (HOSPITAL_COMMUNITY)
Admission: RE | Admit: 2020-01-03 | Discharge: 2020-01-04 | Disposition: A | Discharge status: Home / Self Care | Payer: PRIVATE HEALTH INSURANCE | Attending: Cardiovascular Disease | Admitting: Cardiovascular Disease

## 2020-01-03 DIAGNOSIS — Z955 Presence of coronary angioplasty implant and graft: Secondary | ICD-10-CM

## 2020-01-03 DIAGNOSIS — I2510 Atherosclerotic heart disease of native coronary artery without angina pectoris: Secondary | ICD-10-CM | POA: Diagnosis present

## 2020-01-03 DIAGNOSIS — R0602 Shortness of breath: Secondary | ICD-10-CM | POA: Insufficient documentation

## 2020-01-03 DIAGNOSIS — I25118 Atherosclerotic heart disease of native coronary artery with other forms of angina pectoris: Secondary | ICD-10-CM | POA: Diagnosis not present

## 2020-01-03 DIAGNOSIS — E785 Hyperlipidemia, unspecified: Secondary | ICD-10-CM | POA: Insufficient documentation

## 2020-01-03 DIAGNOSIS — R9439 Abnormal result of other cardiovascular function study: Secondary | ICD-10-CM | POA: Diagnosis present

## 2020-01-03 HISTORY — PX: CORONARY STENT INTERVENTION: CATH118234

## 2020-01-03 HISTORY — PX: LEFT HEART CATH AND CORONARY ANGIOGRAPHY: CATH118249

## 2020-01-03 LAB — POCT ACTIVATED CLOTTING TIME: Activated Clotting Time: 279 seconds

## 2020-01-03 SURGERY — LEFT HEART CATH AND CORONARY ANGIOGRAPHY
Anesthesia: LOCAL

## 2020-01-03 MED ORDER — SODIUM CHLORIDE 0.9 % IV SOLN: 250.0000 mL | INTRAVENOUS | Status: DC | PRN

## 2020-01-03 MED ORDER — VERAPAMIL HCL 2.5 MG/ML IV SOLN
INTRA_ARTERIAL | Status: DC | PRN
  Administered 2020-01-03: 10 mL via INTRA_ARTERIAL

## 2020-01-03 MED ORDER — CLOPIDOGREL BISULFATE 75 MG PO TABS: 75.0000 mg | ORAL_TABLET | Freq: Every evening | ORAL | Status: DC

## 2020-01-03 MED ORDER — SODIUM CHLORIDE 0.9% FLUSH: 3.0000 mL | INTRAVENOUS | Status: DC | PRN

## 2020-01-03 MED ORDER — MIDAZOLAM HCL 2 MG/2ML IJ SOLN
INTRAMUSCULAR | Status: AC
  Filled 2020-01-03: qty 2

## 2020-01-03 MED ORDER — METOPROLOL TARTRATE 12.5 MG HALF TABLET
12.5000 mg | ORAL_TABLET | Freq: Every evening | ORAL | Status: DC
  Administered 2020-01-03: 12.5 mg via ORAL
  Filled 2020-01-03: qty 1

## 2020-01-03 MED ORDER — CLOPIDOGREL BISULFATE 75 MG PO TABS: 75.0000 mg | ORAL_TABLET | Freq: Every day | ORAL | Status: DC

## 2020-01-03 MED ORDER — LIDOCAINE HCL (PF) 1 % IJ SOLN
INTRAMUSCULAR | Status: DC | PRN
  Administered 2020-01-03: 2 mL

## 2020-01-03 MED ORDER — VERAPAMIL HCL 2.5 MG/ML IV SOLN
INTRAVENOUS | Status: AC
  Filled 2020-01-03: qty 2

## 2020-01-03 MED ORDER — ONDANSETRON HCL 4 MG/2ML IJ SOLN: 4.0000 mg | Freq: Four times a day (QID) | INTRAMUSCULAR | Status: DC | PRN

## 2020-01-03 MED ORDER — ALPRAZOLAM 0.5 MG PO TABS
1.0000 mg | ORAL_TABLET | Freq: Every evening | ORAL | Status: DC | PRN
  Administered 2020-01-03: 1 mg via ORAL
  Filled 2020-01-03: qty 2

## 2020-01-03 MED ORDER — HEPARIN SODIUM (PORCINE) 1000 UNIT/ML IJ SOLN
INTRAMUSCULAR | Status: DC | PRN
  Administered 2020-01-03: 6000 [IU] via INTRAVENOUS
  Administered 2020-01-03: 5000 [IU] via INTRAVENOUS

## 2020-01-03 MED ORDER — HYDRALAZINE HCL 20 MG/ML IJ SOLN: 10.0000 mg | INTRAMUSCULAR | Status: AC | PRN

## 2020-01-03 MED ORDER — SODIUM CHLORIDE 0.9% FLUSH
3.0000 mL | Freq: Two times a day (BID) | INTRAVENOUS | Status: DC
  Administered 2020-01-03: 3 mL via INTRAVENOUS

## 2020-01-03 MED ORDER — IOHEXOL 350 MG/ML SOLN
INTRAVENOUS | Status: DC | PRN
  Administered 2020-01-03: 110 mL

## 2020-01-03 MED ORDER — NITROGLYCERIN 1 MG/10 ML FOR IR/CATH LAB
INTRA_ARTERIAL | Status: AC
  Filled 2020-01-03: qty 10

## 2020-01-03 MED ORDER — CLOPIDOGREL BISULFATE 300 MG PO TABS
ORAL_TABLET | ORAL | Status: AC
  Filled 2020-01-03: qty 1

## 2020-01-03 MED ORDER — FENTANYL CITRATE (PF) 100 MCG/2ML IJ SOLN
INTRAMUSCULAR | Status: DC | PRN
  Administered 2020-01-03 (×2): 25 ug via INTRAVENOUS

## 2020-01-03 MED ORDER — HEPARIN (PORCINE) IN NACL 1000-0.9 UT/500ML-% IV SOLN
INTRAVENOUS | Status: AC
  Filled 2020-01-03: qty 1000

## 2020-01-03 MED ORDER — CLOPIDOGREL BISULFATE 75 MG PO TABS: 75.0000 mg | ORAL_TABLET | ORAL | Status: DC

## 2020-01-03 MED ORDER — FENTANYL CITRATE (PF) 100 MCG/2ML IJ SOLN
INTRAMUSCULAR | Status: AC
  Filled 2020-01-03: qty 2

## 2020-01-03 MED ORDER — FLUTICASONE PROPIONATE 50 MCG/ACT NA SUSP
1.0000 | Freq: Every day | NASAL | Status: DC | PRN
  Filled 2020-01-03: qty 16

## 2020-01-03 MED ORDER — HEPARIN SODIUM (PORCINE) 1000 UNIT/ML IJ SOLN
INTRAMUSCULAR | Status: AC
  Filled 2020-01-03: qty 1

## 2020-01-03 MED ORDER — MORPHINE SULFATE (PF) 2 MG/ML IV SOLN: 2.0000 mg | INTRAVENOUS | Status: DC | PRN

## 2020-01-03 MED ORDER — MIDAZOLAM HCL 2 MG/2ML IJ SOLN
INTRAMUSCULAR | Status: DC | PRN
  Administered 2020-01-03 (×2): 1 mg via INTRAVENOUS

## 2020-01-03 MED ORDER — ASPIRIN 81 MG PO CHEW: 81.0000 mg | CHEWABLE_TABLET | ORAL | Status: DC

## 2020-01-03 MED ORDER — ASPIRIN 81 MG PO CHEW: 81.0000 mg | CHEWABLE_TABLET | Freq: Every evening | ORAL | Status: DC

## 2020-01-03 MED ORDER — LIDOCAINE HCL (PF) 1 % IJ SOLN
INTRAMUSCULAR | Status: AC
  Filled 2020-01-03: qty 30

## 2020-01-03 MED ORDER — INFLUENZA VAC SPLIT QUAD 0.5 ML IM SUSY: 0.5000 mL | PREFILLED_SYRINGE | INTRAMUSCULAR | Status: DC

## 2020-01-03 MED ORDER — ACETAMINOPHEN 325 MG PO TABS
650.0000 mg | ORAL_TABLET | ORAL | Status: DC | PRN
  Administered 2020-01-03: 650 mg via ORAL
  Filled 2020-01-03: qty 2

## 2020-01-03 MED ORDER — ATORVASTATIN CALCIUM 40 MG PO TABS
40.0000 mg | ORAL_TABLET | Freq: Every evening | ORAL | Status: DC
  Administered 2020-01-03: 40 mg via ORAL
  Filled 2020-01-03: qty 1

## 2020-01-03 MED ORDER — LABETALOL HCL 5 MG/ML IV SOLN: 10.0000 mg | INTRAVENOUS | Status: AC | PRN

## 2020-01-03 MED ORDER — SODIUM CHLORIDE 0.9 % WEIGHT BASED INFUSION
3.0000 mL/kg/h | INTRAVENOUS | Status: DC
  Administered 2020-01-03: 3 mL/kg/h via INTRAVENOUS

## 2020-01-03 MED ORDER — SODIUM CHLORIDE 0.9 % WEIGHT BASED INFUSION
1.0000 mL/kg/h | INTRAVENOUS | Status: DC
  Administered 2020-01-03: 250 mL via INTRAVENOUS

## 2020-01-03 MED ORDER — ASPIRIN EC 81 MG PO TBEC: 81.0000 mg | DELAYED_RELEASE_TABLET | Freq: Every evening | ORAL | Status: DC

## 2020-01-03 MED ORDER — CLOPIDOGREL BISULFATE 300 MG PO TABS
ORAL_TABLET | ORAL | Status: DC | PRN
  Administered 2020-01-03: 300 mg via ORAL

## 2020-01-03 MED ORDER — ASPIRIN 81 MG PO CHEW: 81.0000 mg | CHEWABLE_TABLET | Freq: Every day | ORAL | Status: DC

## 2020-01-03 MED ORDER — SODIUM CHLORIDE 0.9 % IV SOLN: INTRAVENOUS | Status: AC

## 2020-01-03 MED ORDER — SODIUM CHLORIDE 0.9% FLUSH: 3.0000 mL | Freq: Two times a day (BID) | INTRAVENOUS | Status: DC

## 2020-01-03 SURGICAL SUPPLY — 19 items
BALLN SAPPHIRE 2.0X12 (BALLOONS) ×3
BALLN SAPPHIRE ~~LOC~~ 3.25X12 (BALLOONS) ×1 IMPLANT
BALLOON SAPPHIRE 2.0X12 (BALLOONS) IMPLANT
CATH INFINITI 5FR ANG PIGTAIL (CATHETERS) ×1 IMPLANT
CATH OPTITORQUE TIG 4.0 5F (CATHETERS) ×1 IMPLANT
CATH VISTA GUIDE 6FR XB3.5 (CATHETERS) ×1 IMPLANT
DEVICE RAD COMP TR BAND LRG (VASCULAR PRODUCTS) ×1 IMPLANT
GLIDESHEATH SLEND A-KIT 6F 22G (SHEATH) ×1 IMPLANT
GUIDEWIRE INQWIRE 1.5J.035X260 (WIRE) IMPLANT
INQWIRE 1.5J .035X260CM (WIRE) ×3
KIT ENCORE 26 ADVANTAGE (KITS) ×1 IMPLANT
KIT HEART LEFT (KITS) ×3 IMPLANT
PACK CARDIAC CATHETERIZATION (CUSTOM PROCEDURE TRAY) ×3 IMPLANT
STENT SYNERGY XD 3.0X16 (Permanent Stent) IMPLANT
SYNERGY XD 3.0X16 (Permanent Stent) ×3 IMPLANT
TRANSDUCER W/STOPCOCK (MISCELLANEOUS) ×3 IMPLANT
TUBING CIL FLEX 10 FLL-RA (TUBING) ×3 IMPLANT
WIRE ASAHI PROWATER 180CM (WIRE) ×1 IMPLANT
WIRE HI TORQ VERSACORE-J 145CM (WIRE) ×1 IMPLANT

## 2020-01-03 NOTE — Interval H&P Note (Signed)
Cath Lab Visit (complete for each Cath Lab visit)  Clinical Evaluation Leading to the Procedure:   ACS: No.  Non-ACS:    Anginal Classification: CCS II  Anti-ischemic medical therapy: Minimal Therapy (1 class of medications)  Non-Invasive Test Results: Intermediate-risk stress test findings: cardiac mortality 1-3%/year  Prior CABG: No previous CABG      History and Physical Interval Note:  01/03/2020 3:22 PM  Connor Sanford  has presented today for surgery, with the diagnosis of abnormal stress test.  The various methods of treatment have been discussed with the patient and family. After consideration of risks, benefits and other options for treatment, the patient has consented to  Procedure(s): LEFT HEART CATH AND CORONARY ANGIOGRAPHY (N/A) as a surgical intervention.  The patient's history has been reviewed, patient examined, no change in status, stable for surgery.  I have reviewed the patient's chart and labs.  Questions were answered to the patient's satisfaction.     Quay Burow

## 2020-01-04 ENCOUNTER — Encounter (HOSPITAL_COMMUNITY): Payer: Self-pay | Admitting: Cardiovascular Disease

## 2020-01-04 DIAGNOSIS — I2510 Atherosclerotic heart disease of native coronary artery without angina pectoris: Secondary | ICD-10-CM

## 2020-01-04 DIAGNOSIS — I25118 Atherosclerotic heart disease of native coronary artery with other forms of angina pectoris: Secondary | ICD-10-CM

## 2020-01-04 DIAGNOSIS — R9439 Abnormal result of other cardiovascular function study: Secondary | ICD-10-CM

## 2020-01-04 LAB — BASIC METABOLIC PANEL
Anion gap: 8 (ref 5–15)
BUN: 17 mg/dL (ref 8–23)
CO2: 22 mmol/L (ref 22–32)
Calcium: 8.6 mg/dL — ABNORMAL LOW (ref 8.9–10.3)
Chloride: 106 mmol/L (ref 98–111)
Creatinine, Ser: 0.99 mg/dL (ref 0.61–1.24)
GFR, Estimated: >60 mL/min (ref >60)
Glucose, Bld: 117 mg/dL — ABNORMAL HIGH (ref 70–99)
Potassium: 3.9 mmol/L (ref 3.5–5.1)
Sodium: 136 mmol/L (ref 135–145)

## 2020-01-04 LAB — CBC
HCT: 40 % (ref 39.0–52.0)
Hemoglobin: 14.1 g/dL (ref 13.0–17.0)
MCH: 32.9 pg (ref 26.0–34.0)
MCHC: 35.3 g/dL (ref 30.0–36.0)
MCV: 93.5 fL (ref 80.0–100.0)
Platelets: 150 10*3/uL (ref 150–400)
RBC: 4.28 MIL/uL (ref 4.22–5.81)
RDW: 11.8 % (ref 11.5–15.5)
WBC: 7.7 10*3/uL (ref 4.0–10.5)
nRBC: 0 % (ref 0.0–0.2)

## 2020-01-04 MED ORDER — CLOPIDOGREL BISULFATE 75 MG PO TABS: ORAL_TABLET | ORAL | 3 refills | Status: DC

## 2020-01-04 MED ORDER — ATORVASTATIN CALCIUM 80 MG PO TABS: 80.0000 mg | ORAL_TABLET | Freq: Every day | ORAL | 6 refills | Status: DC

## 2020-01-04 MED FILL — Heparin Sod (Porcine)-NaCl IV Soln 1000 Unit/500ML-0.9%: INTRAVENOUS | Qty: 1000 | Status: AC

## 2020-01-04 NOTE — Progress Notes (Signed)
°   01/03/20 1155  Clinical Encounter Type  Visited With Patient  Visit Type Spiritual support  Referral From Nurse  Consult/Referral To Chaplain  Spiritual Encounters  Spiritual Needs Literature;Prayer;Emotional  Stress Factors  Patient Stress Factors Health changes;Major life changes  Family Stress Factors None identified  Advance Directives (For Healthcare)  Does Patient Have a Medical Advance Directive? No  Would patient like information on creating a medical advance directive? Yes (MAU/Ambulatory/Procedural Areas - Information given)  Chaplain responded to call from nurse that patient requested a Bible and needed spiritual support. Chaplain responded to request and sat with patient until his wife arrived because he was having an anxiety attack.

## 2020-01-04 NOTE — Plan of Care (Signed)
  Problem: Clinical Measurements: Goal: Ability to maintain clinical measurements within normal limits will improve Outcome: Progressing   Problem: Clinical Measurements: Goal: Will remain free from infection Outcome: Progressing   

## 2020-01-04 NOTE — Discharge Instructions (Signed)
Radial Site Care  This sheet gives you information about how to care for yourself after your procedure. Your health care provider may also give you more specific instructions. If you have problems or questions, contact your health care provider. What can I expect after the procedure? After the procedure, it is common to have:  Bruising and tenderness at the catheter insertion area. Follow these instructions at home: Medicines  Take over-the-counter and prescription medicines only as told by your health care provider. Insertion site care  Follow instructions from your health care provider about how to take care of your insertion site. Make sure you: ? Wash your hands with soap and water before you change your bandage (dressing). If soap and water are not available, use hand sanitizer. ? Change your dressing as told by your health care provider. ? Leave stitches (sutures), skin glue, or adhesive strips in place. These skin closures may need to stay in place for 2 weeks or longer. If adhesive strip edges start to loosen and curl up, you may trim the loose edges. Do not remove adhesive strips completely unless your health care provider tells you to do that.  Check your insertion site every day for signs of infection. Check for: ? Redness, swelling, or pain. ? Fluid or blood. ? Pus or a bad smell. ? Warmth.  Do not take baths, swim, or use a hot tub until your health care provider approves.  You may shower 24-48 hours after the procedure, or as directed by your health care provider. ? Remove the dressing and gently wash the site with plain soap and water. ? Pat the area dry with a clean towel. ? Do not rub the site. That could cause bleeding.  Do not apply powder or lotion to the site. Activity   For 24 hours after the procedure, or as directed by your health care provider: ? Do not flex or bend the affected arm. ? Do not push or pull heavy objects with the affected arm. ? Do not  drive yourself home from the hospital or clinic. You may drive 24 hours after the procedure unless your health care provider tells you not to. ? Do not operate machinery or power tools.  Do not lift anything that is heavier than 10 lb (4.5 kg), or the limit that you are told, until your health care provider says that it is safe.  Ask your health care provider when it is okay to: ? Return to work or school. ? Resume usual physical activities or sports. ? Resume sexual activity. General instructions  If the catheter site starts to bleed, raise your arm and put firm pressure on the site. If the bleeding does not stop, get help right away. This is a medical emergency.  If you went home on the same day as your procedure, a responsible adult should be with you for the first 24 hours after you arrive home.  Keep all follow-up visits as told by your health care provider. This is important. Contact a health care provider if:  You have a fever.  You have redness, swelling, or yellow drainage around your insertion site. Get help right away if:  You have unusual pain at the radial site.  The catheter insertion area swells very fast.  The insertion area is bleeding, and the bleeding does not stop when you hold steady pressure on the area.  Your arm or hand becomes pale, cool, tingly, or numb. These symptoms may represent a serious problem   that is an emergency. Do not wait to see if the symptoms will go away. Get medical help right away. Call your local emergency services (911 in the [place] Do not drive yourself to the hospital. Summary  After the procedure, it is common to have bruising and tenderness at the site.  Follow instructions from your health care provider about how to take care of your radial site wound. Check the wound every day for signs of infection.  Do not lift anything that is heavier than 10 lb (4.5 kg), or the limit that you are told, until your health care provider says  that it is safe. This information is not intended to replace advice given to you by your health care provider. Make sure you discuss any questions you have with your health care provider. Document Revised: 03/23/2017 Document Reviewed: 03/23/2017 Elsevier Patient Education  2020 [employer]  

## 2020-01-04 NOTE — Progress Notes (Signed)
CARDIAC REHAB PHASE I   PRE:  Rate/Rhythm: 60 SR  BP:  Supine: 121/72  Sitting:   Standing:    SaO2: 99%RA  MODE:  Ambulation: 800 ft   POST:  Rate/Rhythm: 73 SR  BP:  Supine:   Sitting: 125/73  Standing:    SaO2: 100%RA 0911-1002 Pt walked 800 ft on RA with steady gait and tolerated well. No CP. Education completed with pt and wife who voiced understanding. Stressed importance of plavix with stent. Reviewed NTG use,heart healthy food choices, walking for exercise, and CPR 2. Referred to [place] program. 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 Pt not sure he wants to do program again. Has stationary bike he likes to exercise on at home.    Graylon Good, RN BSN  01/04/2020 9:56 AM

## 2020-01-04 NOTE — Discharge Summary (Addendum)
Discharge Summary    Patient ID: Connor Sanford MRN: [id number]; DOB: September 09, 1955  Admit date: 01/03/2020 Discharge date: 01/04/2020  Primary Care Provider: Mikey Kirschner, MD (Inactive)  Primary Cardiologist: Quay Burow, MD    Discharge Diagnoses    Active Problems:   Atherosclerotic heart disease   CAD (coronary artery disease)   Abnormal nuclear stress test   HLD   Diagnostic Studies/Procedures    CORONARY STENT INTERVENTION  LEFT HEART CATH AND CORONARY ANGIOGRAPHY  Conclusion    Prox RCA lesion is 30% stenosed. Prox Cx lesion is 95% stenosed. 1st Diag lesion is 50% stenosed. A drug-eluting stent was successfully placed using a SYNERGY XD 3.0X16. Post intervention, there is a 0% residual stenosis.   IMPRESSION: Connor Sanford previously placed first diagonal branch stent was patent with 50% in-stent restenosis.  His culprit lesion was a 95% proximal left circumflex stenosis which was stented with a 3 mm x 16 mm long Synergy drug-eluting stent postdilated to 3.3 mm.  He had an excellent angiographic result.  The sheath was removed and a TR band was placed on the right wrist to achieve patent hemostasis.  The patient left the lab in stable condition.  He will be hydrated overnight, discharged home in the morning.  He will remain on dual antiplatelet therapy uninterrupted for 12 months.  Diagnostic Dominance: Right  Intervention     History of Present Illness     Connor Sanford is a 64 y.o. male with hx of CAD s/p 2 overlapping bare-metal stents to his second diagonal 12/2008, HLD and family hx of CAD presented for outpatient cath.   Recently noted increasingly short of breath with activity. Echocardiogram 10/2019 showed normal EF and intermediate diastolic parameters.  No significant valvular abnormalities were noted. Noted ongoing symptoms on follow up 12/24/19 however worsen with increase speed on treadmill. Follow up stress test 12/28/19 was intermediate  risk study showing moderate area ischemia in the base/mid inferior, base/mid inferolateral walls; scar in the mid/distal anterior, distal lateral and apical walls. Cardiac cath arranged.    Hospital Course     Consultants: None  Cath showed 95% proximal left circumflex stenosis which was stented with a 3 mm x 16 mm long Synergy drug-eluting stent postdilated to 3.3 mm. first diagonal branch stent was patent with 50% in-stent restenosis. No immediate complication. Admitted overnight for hydration. Renal function and hemoglobin are stable. Recommended uninterrupted DAPT with ASA and Plavix for 12 months. Continue low dose BB. Increase Lipitor to 41m qd. Check lipid panel and LFTS in 6 weeks. Ambulated well.   Did the patient have an acute coronary syndrome (MI, NSTEMI, STEMI, etc) this admission?:  No                               Did the patient have a percutaneous coronary intervention (stent / angioplasty)?:  Yes.     Cath/PCI Registry Performance & Quality Measures: Aspirin prescribed? - Yes ADP Receptor Inhibitor (Plavix/Clopidogrel, Brilinta/Ticagrelor or Effient/Prasugrel) prescribed (includes medically managed patients)? - Yes High Intensity Statin (Lipitor 40-81mor Crestor 20-4048mprescribed? - Yes For EF <40%, was ACEI/ARB prescribed? - Not Applicable (EF >/= 40%49%or EF <40%, Aldosterone Antagonist (Spironolactone or Eplerenone) prescribed? - Not Applicable (EF >/= 40%67%ardiac Rehab Phase II ordered? - Yes   _____________  Discharge Vitals Blood pressure 121/82, pulse 60, temperature 97.6 F (36.4 C), temperature source Oral, resp. rate 20, height 5'  10" (1.778 m), weight 102.1 kg, SpO2 98 %.  Filed Weights   01/03/20 1147  Weight: 102.1 kg   Physical Exam Constitutional:      Appearance: Normal appearance.  HENT:     Head: Normocephalic and atraumatic.  Eyes:     Extraocular Movements: Extraocular movements intact.     Pupils: Pupils are equal, round, and reactive  to light.  Cardiovascular:     Rate and Rhythm: Regular rhythm.     Pulses: Normal pulses.     Comments: Right radical cath site without hematoma Pulmonary:     Effort: Pulmonary effort is normal.     Breath sounds: Normal breath sounds.  Abdominal:     General: Abdomen is flat. Bowel sounds are normal.     Palpations: Abdomen is soft.  Musculoskeletal:        General: Normal range of motion.     Cervical back: Normal range of motion and neck supple.  Skin:    General: Skin is warm and dry.  Neurological:     General: No focal deficit present.     Mental Status: He is alert and oriented to person, place, and time.  Psychiatric:        Mood and Affect: Mood normal.        Behavior: Behavior normal.     Labs & Radiologic Studies    CBC Recent Labs    01/04/20 0130  WBC 7.7  HGB 14.1  HCT 40.0  MCV 93.5  PLT 388   Basic Metabolic Panel Recent Labs    01/04/20 0130  NA 136  K 3.9  CL 106  CO2 22  GLUCOSE 117*  BUN 17  CREATININE 0.99  CALCIUM 8.6*  _____________  CARDIAC CATHETERIZATION  Result Date: 01/03/2020  Prox RCA lesion is 30% stenosed.  Prox Cx lesion is 95% stenosed.  1st Diag lesion is 50% stenosed.  A drug-eluting stent was successfully placed using a SYNERGY XD 3.0X16.  Post intervention, there is a 0% residual stenosis.  Connor Sanford is a 64 y.o. male  [id number] LOCATION:  FACILITY: [facility] PHYSICIAN: Quay Burow, M.D. 06/22/1955 DATE OF PROCEDURE:  01/03/2020 DATE OF DISCHARGE: CARDIAC CATHETERIZATION / PCI DES LCX History obtained from chart review.Connor Sanford is a 64 y.o.   mildly overweight married Caucasian male, father of 2, who retired on Jun 29 2012 from working at the [employer]. I last saw him in the office  11/30/2017.  He has a history of hyperlipidemia as well as a strong family history of heart disease and a father who had bypass surgery age 46. 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 He did have moderate inferolateral hypokinesia with an EF of 45% to 50%, with a peak CPK of 1000 and MB of 143. Echocardiogram performed April 06, 2009, showed normal LV systolic function without segmental wall motion abnormalities, and Myoview showed inferoapical scar without ischemia. He did participate in cardiac .  He did fall down in January and injured his left shoulder and now requiring rotator cuff repair. A Myoview stress test performed on 05/23/13 for preoperative clearance revealed a small area of apical scar with preserved ejection fraction. I last saw him in the office a year ago.  Over the last several months has developed dyspnea on exertion and predictable exertional chest pain.  He saw Coletta Memos, FNP in the office who  ordered a Myoview stress test on 12/28/2019 there is remarkable for inferolateral ischemia.  Because of this he was referred for diagnostic coronary angiography to define his anatomy. PROCEDURE DESCRIPTION: The patient was brought to the second floor [facility] cath lab in the postabsorptive state. He was premedicated with IV Versed and fentanyl. His right wrist was prepped and shaved in usual sterile fashion. Xylocaine 1% was used for local anesthesia. A 6 French sheath was inserted into the right radial artery using standard Seldinger technique. The patient received 5000 units  of heparin intravenously.  5 [place] TIG catheter pigtail catheters were used for selective coronary angiography and obtain left heart pressures.  Isovue dye was used for the entirety of the case.  Retrograde aorta, ventricular and pullback pressures were recorded.  Radial cocktail was administered via the SideArm sheath. The patient received an additional 6000 as of heparin with an ACT above 250.  He was already on aspirin Plavix and received an additional 300 mg of p.o. Plavix.  Isovue dye was used for the entirety of  the intervention.  Retrograde aortic pressures monitored in the case.  Using a 6 [place] XB 3.5 cm guide catheter along with a 0.14 [place] guidewire and a 2 mm x 12 mm balloon the circumflex lesion was easily crossed and predilated.  Following this a 3 mm x 16 mm long Synergy drug-eluting stent was then carefully positioned and deployed at 14 atm.  It was postdilated with a 3.25 mm x 12 mm long noncompliant balloon at 14 atm (3.3 mm) resulting in reduction of a 95% proximal concentric fairly focal left circumflex stenosis to 0% residual with excellent flow.  The patient tolerated procedure well.  There were no hemodynamic or electrocardiographic sequela.   Connor Sanford previously placed first diagonal branch stent was patent with 50% in-stent restenosis.  His culprit lesion was a 95% proximal left circumflex stenosis which was stented with a 3 mm x 16 mm long Synergy drug-eluting stent postdilated to 3.3 mm.  He had an excellent angiographic result.  The sheath was removed and a TR band was placed on the right wrist to achieve patent hemostasis.  The patient left the lab in stable condition.  He will be hydrated overnight, discharged home in the morning.  He will remain on dual antiplatelet therapy uninterrupted for 12 months. Quay Burow. MD, [facility] 01/03/2020 4:27 PM   MYOCARDIAL PERFUSION IMAGING  Result Date: 12/28/2019  Lexiscan stress is negative for ischemia  Myovue scan shows a moderate area ischemia in the base/mid inferior, base/mid inferolateral walls; scar in the mid/distal anterior, distal lateral and apical walls  LVEF is 57% with normal thickening  Compared to scan from 2015, inferior/inferolateral ischemia is now present.  Overall intermediate risk scan.    Disposition   Connor Sanford is being discharged home today in good condition.  Follow-up Plans & Appointments     Follow-up Information     [name], MD. Go on 01/15/2020.   Specialties: Cardiology, Radiology Why: @8 :30am  for cath follow up. Please arrive 10-15 minutes early  Contact information: [address] [place] [address] [phone]                Discharge Instructions     AMB Referral to Cardiac Rehabilitation - Phase II   Complete by: As directed    Diagnosis: Coronary Stents   After initial evaluation and assessments completed: Virtual Based Care may be provided alone or in conjunction with  Phase 2 Cardiac Rehab based on patient barriers.: Yes   Diet - low sodium heart healthy   Complete by: As directed    Increase activity slowly   Complete by: As directed        Discharge Medications   Allergies as of 01/04/2020   No Known Allergies      Medication List     TAKE these medications    ALPRAZolam 1 MG tablet Commonly known as: XANAX TAKE ONE TABLET (1MG TOTAL) BY MOUTH AT BEDTIME AS NEEDED FOR SLEEP What changed:  how much to take how to take this when to take this reasons to take this additional instructions   aspirin EC 81 MG tablet Take 81 mg by mouth every evening.   atorvastatin 80 MG tablet Commonly known as: LIPITOR Take 1 tablet (80 mg total) by mouth daily. What changed:  medication strength See the new instructions.   clopidogrel 75 MG tablet Commonly known as: PLAVIX TAKE ONE (1) TABLET BY MOUTH EVERY DAY What changed: See the new instructions.   fexofenadine 180 MG tablet Commonly known as: ALLEGRA Take 180 mg by mouth daily as needed for allergies or rhinitis.   fluticasone 50 MCG/ACT nasal spray Commonly known as: FLONASE Place 1 spray into both nostrils daily as needed for allergies or rhinitis.   Ibuprofen PM 200-25 MG Caps Generic drug: Ibuprofen-diphenhydrAMINE HCl Take 1-2 tablets by mouth at bedtime as needed (sleep.).   metoprolol tartrate 25 MG tablet Commonly known as: LOPRESSOR TAKE ONE-HALF TABLET (12.5MG TOTAL) BY MOUTH TWO TIMES DAILY What changed: See the new instructions.   multivitamin with  iron-minerals liquid Take 30 mLs by mouth daily.   nitroGLYCERIN 0.4 MG SL tablet Commonly known as: NITROSTAT Place 0.4 mg under the tongue every 5 (five) minutes x 3 doses as needed for chest pain.           Outstanding Labs/Studies    Check lipid panel and LFTS in 6 weeks.  Duration of Discharge Encounter   Greater than 30 minutes including physician time.  SignedLeanor Kail, PA 01/04/2020, 9:21 AM  Personally seen and examined. Agree with APP above with the following comments: Briefly 64 yo M prior Diag PCI for LCP after stress test Patient notes that he feels fine and CP free. Exam notable for no R radial hematoma or bruit Would recommend increasing statin; gave education on activity after LHC, gave diet education Asymptomatic from bradycardia, continue statin

## 2020-01-07 ENCOUNTER — Ambulatory Visit: Payer: PRIVATE HEALTH INSURANCE | Admitting: Orthopedic Surgery

## 2020-01-15 ENCOUNTER — Ambulatory Visit (INDEPENDENT_AMBULATORY_CARE_PROVIDER_SITE_OTHER): Payer: PRIVATE HEALTH INSURANCE | Admitting: Cardiovascular Disease

## 2020-01-15 ENCOUNTER — Other Ambulatory Visit: Payer: Self-pay

## 2020-01-15 ENCOUNTER — Encounter: Payer: Self-pay | Admitting: Cardiovascular Disease

## 2020-01-15 DIAGNOSIS — E782 Mixed hyperlipidemia: Secondary | ICD-10-CM | POA: Diagnosis not present

## 2020-01-15 DIAGNOSIS — I2510 Atherosclerotic heart disease of native coronary artery without angina pectoris: Secondary | ICD-10-CM

## 2020-01-15 NOTE — Assessment & Plan Note (Signed)
History of remote PCI and drug-eluting stenting of his diagonal branch using 2 overlapping bare-metal stents by myself 01/27/2009. Because of progressive dyspnea and exertional chest pain he underwent Myoview stress testing 12/28/2019 revealing inferolateral ischemia which led to a cardiac catheterization which I performed 01/03/2020 via the right radial approach revealing a patent diagonal stent with a 95% proximal AV groove circumflex stenosis which I stented using a 3 mm x 60 mm long Synergy drug-eluting stent postdilated to 3.3 mm. His symptoms have completely resolved. He is now walking on the treadmill without limitation. He is on dual antiplatelet therapy.

## 2020-01-15 NOTE — Patient Instructions (Signed)

## 2020-01-15 NOTE — Assessment & Plan Note (Signed)
History of hyperlipidemia on high-dose statin therapy. His last lipid profile was performed a year ago, 01/02/2019, with a total cholesterol 113, LDL 62 and HDL 38. His atorvastatin has since been increased from 40 mg to 80 mg. This is followed by his PCP.

## 2020-01-15 NOTE — Progress Notes (Signed)
01/15/2020 Connor Sanford   25-Mar-1955  [id number]  Primary Physician Mikey Kirschner, MD (Inactive) Primary Cardiologist: [name] MD Garret Reddish, [place], [place]  HPI:  Connor Sanford is a 64 y.o.  mildly overweight married Caucasian male, father of 2, who retired on Jun 29 2012 from working at the [employer]. I last saw him in the office 01/05/2019.He has a history of hyperlipidemia as well as a strong family history of heart disease and a father who had bypass surgery age 62. 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 He did have moderate inferolateral hypokinesia with an EF of 45% to 50%, with a peak CPK of 1000 and MB of 143. Echocardiogram performed April 06, 2009, showed normal LV systolic function without segmental wall motion abnormalities, and Myoview showed inferoapical scar without ischemia. He did participate in cardiac . He did fall down in January and injured his left shoulder and now requiring rotator cuff repair. A Myoview stress test performed on 05/23/13 for preoperative clearance revealed a small area of apical scar with preserved ejectionfraction.  I last saw him in the office a year ago.  Over the last several months has developed dyspnea on exertion and predictable exertional chest pain.  He saw Coletta Memos, FNP in the office who ordered a Myoview stress test on 12/28/2019 there is remarkable for inferolateral ischemia.  Because of this he was referred for diagnostic coronary angiography which I performed on 01/03/2020. This showed a patent diagonal branch stent with a 95% proximal AV groove circumflex stenosis which I stented using a synergy 3 mm x 60 mm long drug-eluting stent postdilated to 3.3 mm. His symptoms have completely resolved.   Current Meds  Medication Sig  . ALPRAZolam (XANAX) 1 MG tablet TAKE ONE TABLET (1MG  TOTAL) BY MOUTH  AT BEDTIME AS NEEDED FOR SLEEP (Patient taking differently: Take 1 mg by mouth at bedtime as needed for sleep. )  . aspirin EC 81 MG tablet Take 81 mg by mouth every evening.   Marland Kitchen atorvastatin (LIPITOR) 80 MG tablet Take 1 tablet (80 mg total) by mouth daily.  . clopidogrel (PLAVIX) 75 MG tablet TAKE ONE (1) TABLET BY MOUTH EVERY DAY  . fexofenadine (ALLEGRA) 180 MG tablet Take 180 mg by mouth daily as needed for allergies or rhinitis.  . fluticasone (FLONASE) 50 MCG/ACT nasal spray Place 1 spray into both nostrils daily as needed for allergies or rhinitis.  . Ibuprofen-diphenhydrAMINE HCl (IBUPROFEN PM) 200-25 MG CAPS Take 1-2 tablets by mouth at bedtime as needed (sleep.).  Marland Kitchen metoprolol tartrate (LOPRESSOR) 25 MG tablet TAKE ONE-HALF TABLET (12.5MG  TOTAL) BY MOUTH TWO TIMES DAILY (Patient taking differently: Take 12.5 mg by mouth every evening. )  . Multiple Vitamins-Minerals (MULTIVITAMIN WITH IRON-MINERALS) liquid Take 30 mLs by mouth daily.    . nitroGLYCERIN (NITROSTAT) 0.4 MG SL tablet Place 0.4 mg under the tongue every 5 (five) minutes x 3 doses as needed for chest pain.      No Known Allergies  Social History   Socioeconomic History  . Marital status: Married    Spouse name: Not on file  . Number of children: Not on file  . Years of education: Not on file  . Highest education level: Not on file  Occupational History  . Not on file  Tobacco Use  . Smoking status: Never Smoker  . Smokeless tobacco:  Never Used  Substance and Sexual Activity  . Alcohol use: No  . Drug use: No  . Sexual activity: Yes    Birth control/protection: None  Other Topics Concern  . Not on file  Social History Narrative  . Not on file   Social Determinants of Health   Financial Resource Strain:   . Difficulty of Paying Living Expenses: Not on file  Food Insecurity:   . Worried About Charity fundraiser in the Last Year: Not on file  . Ran Out of Food in the Last Year: Not on file    Transportation Needs:   . Lack of Transportation (Medical): Not on file  . Lack of Transportation (Non-Medical): Not on file  Physical Activity:   . Days of Exercise per Week: Not on file  . Minutes of Exercise per Session: Not on file  Stress:   . Feeling of Stress : Not on file  Social Connections:   . Frequency of Communication with Friends and Family: Not on file  . Frequency of Social Gatherings with Friends and Family: Not on file  . Attends Religious Services: Not on file  . Active Member of Clubs or Organizations: Not on file  . Attends Archivist Meetings: Not on file  . Marital Status: Not on file  Intimate Partner Violence:   . Fear of Current or Ex-Partner: Not on file  . Emotionally Abused: Not on file  . Physically Abused: Not on file  . Sexually Abused: Not on file     Review of Systems: General: negative for chills, fever, night sweats or weight changes.  Cardiovascular: negative for chest pain, dyspnea on exertion, edema, orthopnea, palpitations, paroxysmal nocturnal dyspnea or shortness of breath Dermatological: negative for rash Respiratory: negative for cough or wheezing Urologic: negative for hematuria Abdominal: negative for nausea, vomiting, diarrhea, bright red blood per rectum, melena, or hematemesis Neurologic: negative for visual changes, syncope, or dizziness All other systems reviewed and are otherwise negative except as noted above.    Blood pressure 117/62, pulse 62, height 5\' 10"  (1.778 m), weight 231 lb 6.4 oz (105 kg), SpO2 100 %.  General appearance: alert and no distress Neck: no adenopathy, no carotid bruit, no JVD, supple, symmetrical, trachea midline and thyroid not enlarged, symmetric, no tenderness/mass/nodules Lungs: clear to auscultation bilaterally Heart: regular rate and rhythm, S1, S2 normal, no murmur, click, rub or gallop Extremities: extremities normal, atraumatic, no cyanosis or edema Pulses: 2+ and symmetric Skin:  Skin color, texture, turgor normal. No rashes or lesions Neurologic: Alert and oriented X 3, normal strength and tone. Normal symmetric reflexes. Normal coordination and gait  EKG not performed today  ASSESSMENT AND PLAN:   Hyperlipidemia History of hyperlipidemia on high-dose statin therapy. His last lipid profile was performed a year ago, 01/02/2019, with a total cholesterol 113, LDL 62 and HDL 38. His atorvastatin has since been increased from 40 mg to 80 mg. This is followed by his PCP.  CAD (coronary artery disease) History of remote PCI and drug-eluting stenting of his diagonal branch using 2 overlapping bare-metal stents by myself 01/27/2009. Because of progressive dyspnea and exertional chest pain he underwent Myoview stress testing 12/28/2019 revealing inferolateral ischemia which led to a cardiac catheterization which I performed 01/03/2020 via the right radial approach revealing a patent diagonal stent with a 95% proximal AV groove circumflex stenosis which I stented using a 3 mm x 60 mm long Synergy drug-eluting stent postdilated to 3.3 mm. His symptoms have  completely resolved. He is now walking on the treadmill without limitation. He is on dual antiplatelet therapy.      [name] MD FACP,FACC,FAHA, [facility] 01/15/2020 8:56 AM

## 2020-02-06 ENCOUNTER — Other Ambulatory Visit: Payer: Self-pay

## 2020-02-06 ENCOUNTER — Ambulatory Visit (INDEPENDENT_AMBULATORY_CARE_PROVIDER_SITE_OTHER): Payer: PRIVATE HEALTH INSURANCE | Admitting: Orthopedic Surgery

## 2020-02-06 ENCOUNTER — Encounter: Payer: Self-pay | Admitting: Orthopedic Surgery

## 2020-02-06 VITALS — BP 134/67 | HR 68 | Ht 70.0 in | Wt 225.0 lb

## 2020-02-06 DIAGNOSIS — M7021 Olecranon bursitis, right elbow: Secondary | ICD-10-CM | POA: Diagnosis not present

## 2020-02-06 NOTE — Patient Instructions (Signed)
Call us when needed

## 2020-02-06 NOTE — Progress Notes (Signed)
Chief Complaint  Patient presents with  . Elbow Problem    right elbow swelling    64 year old male recurrent swelling right elbow hemorrhagic bursitis  Patient recently had a stent requiring 12 months of Plavix  Reaspiration  Patient was prepped for reaspiration consent was obtained verbally timeout was completed to confirm right elbow 18-gauge needle aspirated 20 cc of dark red blood  We injected 40 mg Depo-Medrol he will call on an as-needed basis and can be squeezed in for aspiration at any time  Encounter Diagnosis  Name Primary?  Marland Kitchen Olecranon bursitis, right elbow Yes

## 2020-03-03 ENCOUNTER — Ambulatory Visit: Payer: PRIVATE HEALTH INSURANCE | Admitting: Orthopedic Surgery

## 2020-03-19 ENCOUNTER — Encounter: Payer: Self-pay | Admitting: Family Medicine

## 2020-03-19 ENCOUNTER — Telehealth: Payer: Self-pay | Admitting: *Deleted

## 2020-03-19 ENCOUNTER — Telehealth (INDEPENDENT_AMBULATORY_CARE_PROVIDER_SITE_OTHER): Payer: PRIVATE HEALTH INSURANCE | Admitting: Family Medicine

## 2020-03-19 ENCOUNTER — Other Ambulatory Visit: Payer: Self-pay

## 2020-03-19 DIAGNOSIS — U071 COVID-19: Secondary | ICD-10-CM

## 2020-03-19 DIAGNOSIS — R059 Cough, unspecified: Secondary | ICD-10-CM

## 2020-03-19 MED ORDER — BENZONATATE 100 MG PO CAPS: 100.0000 mg | ORAL_CAPSULE | Freq: Two times a day (BID) | ORAL | 0 refills | Status: DC | PRN

## 2020-03-19 MED ORDER — ALBUTEROL SULFATE HFA 108 (90 BASE) MCG/ACT IN AERS: 2.0000 | INHALATION_SPRAY | Freq: Four times a day (QID) | RESPIRATORY_TRACT | 0 refills | Status: DC | PRN

## 2020-03-19 NOTE — Telephone Encounter (Signed)
Connor Sanford, Connor Sanford are scheduled for a virtual visit with your provider today.    Just as we do with appointments in the office, we must obtain your consent to participate.  Your consent will be active for this visit and any virtual visit you may have with one of our providers in the next 365 days.    If you have a MyChart account, I can also send a copy of this consent to you electronically.  All virtual visits are billed to your insurance company just like a traditional visit in the office.  As this is a virtual visit, video technology does not allow for your provider to perform a traditional examination.  This may limit your provider's ability to fully assess your condition.  If your provider identifies any concerns that need to be evaluated in person or the need to arrange testing such as labs, EKG, etc, we will make arrangements to do so.    Although advances in technology are sophisticated, we cannot ensure that it will always work on either your end or our end.  If the connection with a video visit is poor, we may have to switch to a telephone visit.  With either a video or telephone visit, we are not always able to ensure that we have a secure connection.   I need to obtain your verbal consent now.   Are you willing to proceed with your visit today?   Connor Sanford has provided verbal consent on 03/19/2020 for a virtual visit (video or telephone).

## 2020-03-19 NOTE — Progress Notes (Signed)
Patient ID: Connor Sanford, male    DOB: 04/22/1955, 65 y.o.   MRN: [id number]   Chief Complaint  Patient presents with  . Cough    Congestion since Friday- Covid positive last night- not feeling really bad   Subjective:  CC: Covid infection, cough and congestion  This is a new problem.  Presents today via telephone visit with a complaint of COVID positive, cough and congestion, fever, chills, fatigue, sinus pressure, and  headache..  Symptoms started on Friday.  Reports that he "feels pretty good "has tried Tylenol and Mucinex since Saturday.  He has had two doses of the COVID-vaccine, has not had his booster yet.  He reports that he is staying hydrated and wishes for his cough to be treated today.    Medical History Connor Sanford has a past medical history of Coronary artery disease, H/O seasonal allergies, Hyperlipidemia, Hypertension, and MI (myocardial infarction) ([place]) (2010).   Outpatient Encounter Medications as of 03/19/2020  Medication Sig  . albuterol (VENTOLIN HFA) 108 (90 Base) MCG/ACT inhaler Inhale 2 puffs into the lungs every 6 (six) hours as needed for wheezing or shortness of breath.  . benzonatate (TESSALON) 100 MG capsule Take 1 capsule (100 mg total) by mouth 2 (two) times daily as needed for cough.  . ALPRAZolam (XANAX) 1 MG tablet TAKE ONE TABLET (1MG  TOTAL) BY MOUTH AT BEDTIME AS NEEDED FOR SLEEP (Patient taking differently: Take 1 mg by mouth at bedtime as needed for sleep. )  . aspirin EC 81 MG tablet Take 81 mg by mouth every evening.   Marland Kitchen atorvastatin (LIPITOR) 80 MG tablet Take 1 tablet (80 mg total) by mouth daily.  . clopidogrel (PLAVIX) 75 MG tablet TAKE ONE (1) TABLET BY MOUTH EVERY DAY  . fexofenadine (ALLEGRA) 180 MG tablet Take 180 mg by mouth daily as needed for allergies or rhinitis.  . fluticasone (FLONASE) 50 MCG/ACT nasal spray Place 1 spray into both nostrils daily as needed for allergies or rhinitis.  . Ibuprofen-diphenhydrAMINE HCl (IBUPROFEN PM)  200-25 MG CAPS Take 1-2 tablets by mouth at bedtime as needed (sleep.).  Marland Kitchen metoprolol tartrate (LOPRESSOR) 25 MG tablet TAKE ONE-HALF TABLET (12.5MG  TOTAL) BY MOUTH TWO TIMES DAILY (Patient taking differently: Take 12.5 mg by mouth every evening. )  . Multiple Vitamins-Minerals (MULTIVITAMIN WITH IRON-MINERALS) liquid Take 30 mLs by mouth daily.    . nitroGLYCERIN (NITROSTAT) 0.4 MG SL tablet Place 0.4 mg under the tongue every 5 (five) minutes x 3 doses as needed for chest pain.    No facility-administered encounter medications on file as of 03/19/2020.     Review of Systems  Constitutional: Positive for chills, fatigue and fever.  HENT: Positive for congestion and sinus pressure.   Respiratory: Positive for cough. Negative for shortness of breath.   Cardiovascular: Negative for chest pain.  Gastrointestinal: Negative for abdominal pain, diarrhea, nausea and vomiting.  Musculoskeletal: Positive for myalgias.  Neurological: Positive for headaches.     Vitals There were no vitals taken for this visit. Unable- does not have pulse ox meter Objective:   Physical Exam Unable- able to converse throughout phone visit without obvious shortness of breath.  Assessment and Plan   1. COVID-19 virus infection - benzonatate (TESSALON) 100 MG capsule; Take 1 capsule (100 mg total) by mouth 2 (two) times daily as needed for cough.  Dispense: 20 capsule; Refill: 0 - albuterol (VENTOLIN HFA) 108 (90 Base) MCG/ACT inhaler; Inhale 2 puffs into the lungs every 6 (six) hours  as needed for wheezing or shortness of breath.  Dispense: 8 g; Refill: 0 - Ambulatory referral for Covid Treatment  2. Cough - benzonatate (TESSALON) 100 MG capsule; Take 1 capsule (100 mg total) by mouth 2 (two) times daily as needed for cough.  Dispense: 20 capsule; Refill: 0 - albuterol (VENTOLIN HFA) 108 (90 Base) MCG/ACT inhaler; Inhale 2 puffs into the lungs every 6 (six) hours as needed for wheezing or shortness of breath.   Dispense: 8 g; Refill: 0   Will treat cough with Tessalon Perles and albuterol inhaler.  Reports that he does not have a pulse oximeter to measure oxygen saturation.  COVID warning as stated below given.  Recommend supportive therapy, adequate hydration and symptomatic treatment.  He has coronary artery disease, I will refer to COVID infusion.  Agrees with plan of care discussed today. Understands warning signs to seek further care: chest pain, shortness of breath, any significant change in health.  Understands to follow-up if symptoms do not improve, or worsen.   Covid-19 warning:  Covid-19 is a virus that causes hypoxia (low oxygen level in blood) in some people. If you develop any changes in your usual breathing pattern: difficulty catching your breath, more short winded with activity or with resting, or anything that concerns you about your breathing, do not hesitate to go to the emergency department immediately for evaluation. Covid infection can also affect the way the brain functions if it lacks oxygen, such as, feeling dizzy, passing out, or feeling confused, if you experience any of these symptoms, please do not delay to seek treatment.  Some people experience gastrointestinal problems with Covid, such as vomiting and diarrhea, dehydration is a serious risk and should be avoided. If you are unable to keep liquids down you may need to go to the emergency department for intravenous fluids to avoid dehydration.   Please alert and involve your family and/or friends to help keep an eye on you while you recover from Covid-19. If you have any questions or concerns about your recovery, please do not hesitate to call the office for guidance.   Recommend supportive therapy while you are recovering:   1) Get lots of rest.  2) Take over the counter pain medication if needed, such as acetaminophen or ibuprofen. Read and follow instructions on the label and make sure not to combine other medications  that may have same ingredients in it. It is important to not take too much of these ingredients.  3) Drink plenty of caffeine-free fluids. (If you have heart or kidney problems, follow the instructions of your specialist regarding amounts).  4) If you are hungry, eat a bland diet, such as the BRAT diet (bananas, rice, applesauce, toast).  5) Let us know if you are not feeling better in a week.   Covid-19 Quarantine Instructions:   You have tested positive for Covid-19 infection. The current CDC guidelines for quarantine regardless of vaccination status are:   Please quarantine and isolate at home for a minimum of  5 days.   - If you have no symptoms or your symptoms are resolving after 5 days you   can leave the home (resolving means no shortness of breath, no fever, without taking fever reducing medication, no headache, etc). -Continue to wear a mask around others for an additional 5 days.  -If you were severely ill with Covid-19 you should isolate for at least 10 days.    Use over-the-counter medications for symptoms.If you develop respiratory issues/distress (see  Covid warning), seek medical care in the Emergency Department.  If you must leave home or if you have to be around others please wear a mask. Please limit contact with immediate family members in the home, practice social distancing, frequent handwashing and clean hard surfaces touched frequently with household cleaning products. Members of your household will also need to quarantine for 5 days and test on day five if possible.  Covid-19 warning:  Covid-19 is a virus that causes hypoxia (low oxygen level in blood) in some people. If you develop any changes in your usual breathing pattern: difficulty catching your breath, more short winded with activity or with resting, or anything that concerns you about your breathing, do not hesitate to go to the emergency department immediately for evaluation. Covid infection can also affect  the way the brain functions if it lacks oxygen, such as, feeling dizzy, passing out, or feeling confused, if you experience any of these symptoms, please do not delay to seek treatment.  Some people experience gastrointestinal problems with Covid, such as vomiting and diarrhea, dehydration is a serious risk and should be avoided. If you are unable to keep liquids down you may need to go to the emergency department for intravenous fluids to avoid dehydration.   Please alert and involve your family and/or friends to help keep an eye on you while you recover from Covid-19. If you have any questions or concerns about your recovery, please do not hesitate to call the office for guidance.     Virtual Visit via Telephone Note  I connected with Connor Sanford on 03/19/20 at  2:00 PM EST by telephone and verified that I am speaking with the correct person using two identifiers.  Location: Patient: home Provider: office   I discussed the limitations, risks, security and privacy concerns of performing an evaluation and management service by telephone and the availability of in person appointments. I also discussed with the patient that there may be a patient responsible charge related to this service. The patient expressed understanding and agreed to proceed.   History of Present Illness:    Observations/Objective:   Assessment and Plan:   Follow Up Instructions:    I discussed the assessment and treatment plan with the patient. The patient was provided an opportunity to ask questions and all were answered. The patient agreed with the plan and demonstrated an understanding of the instructions.   The patient was advised to call back or seek an in-person evaluation if the symptoms worsen or if the condition fails to improve as anticipated.  I provided 7  minutes of non-face-to-face time during this encounter.

## 2020-03-26 ENCOUNTER — Other Ambulatory Visit: Payer: Self-pay | Admitting: Cardiovascular Disease

## 2020-03-26 DIAGNOSIS — I2510 Atherosclerotic heart disease of native coronary artery without angina pectoris: Secondary | ICD-10-CM

## 2020-04-28 ENCOUNTER — Telehealth: Payer: Self-pay | Admitting: Family Medicine

## 2020-04-28 DIAGNOSIS — Z0000 Encounter for general adult medical examination without abnormal findings: Secondary | ICD-10-CM

## 2020-04-28 DIAGNOSIS — Z13220 Encounter for screening for lipoid disorders: Secondary | ICD-10-CM

## 2020-04-28 DIAGNOSIS — Z125 Encounter for screening for malignant neoplasm of prostate: Secondary | ICD-10-CM

## 2020-04-28 NOTE — Telephone Encounter (Signed)
Patient has physical next and needing labs done

## 2020-04-28 NOTE — Telephone Encounter (Signed)
Last labs 01/02/19 lipid, liver, bmp, psa

## 2020-04-29 NOTE — Telephone Encounter (Signed)
Orders put in and pt notified.  

## 2020-04-29 NOTE — Telephone Encounter (Signed)
Yes pls order

## 2020-05-03 LAB — COMPREHENSIVE METABOLIC PANEL
ALT: 48 IU/L — ABNORMAL HIGH (ref 0–44)
AST: 36 IU/L (ref 0–40)
Albumin/Globulin Ratio: 2.5 — ABNORMAL HIGH (ref 1.2–2.2)
Albumin: 4.7 g/dL (ref 3.8–4.8)
Alkaline Phosphatase: 92 IU/L (ref 44–121)
BUN/Creatinine Ratio: 20 (ref 10–24)
BUN: 21 mg/dL (ref 8–27)
Bilirubin Total: 0.8 mg/dL (ref 0.0–1.2)
CO2: 20 mmol/L (ref 20–29)
Calcium: 9.3 mg/dL (ref 8.6–10.2)
Chloride: 100 mmol/L (ref 96–106)
Creatinine, Ser: 1.05 mg/dL (ref 0.76–1.27)
Globulin, Total: 1.9 g/dL (ref 1.5–4.5)
Glucose: 113 mg/dL — ABNORMAL HIGH (ref 65–99)
Potassium: 5.3 mmol/L — ABNORMAL HIGH (ref 3.5–5.2)
Sodium: 140 mmol/L (ref 134–144)
Total Protein: 6.6 g/dL (ref 6.0–8.5)
eGFR: 80 mL/min/{1.73_m2} (ref >59)

## 2020-05-03 LAB — PSA: Prostate Specific Ag, Serum: 0.5 ng/mL (ref 0.0–4.0)

## 2020-05-03 LAB — LIPID PANEL
Chol/HDL Ratio: 3.4 ratio (ref 0.0–5.0)
Cholesterol, Total: 115 mg/dL (ref 100–199)
HDL: 34 mg/dL — ABNORMAL LOW (ref >39)
LDL Chol Calc (NIH): 62 mg/dL (ref 0–99)
Triglycerides: 98 mg/dL (ref 0–149)
VLDL Cholesterol Cal: 19 mg/dL (ref 5–40)

## 2020-05-08 ENCOUNTER — Ambulatory Visit (INDEPENDENT_AMBULATORY_CARE_PROVIDER_SITE_OTHER): Payer: PRIVATE HEALTH INSURANCE | Admitting: Family Medicine

## 2020-05-08 ENCOUNTER — Other Ambulatory Visit: Payer: Self-pay

## 2020-05-08 ENCOUNTER — Encounter: Payer: Self-pay | Admitting: Family Medicine

## 2020-05-08 VITALS — BP 122/74 | HR 64 | Temp 97.5°F | Ht 70.0 in | Wt 227.0 lb

## 2020-05-08 DIAGNOSIS — Z1211 Encounter for screening for malignant neoplasm of colon: Secondary | ICD-10-CM | POA: Diagnosis not present

## 2020-05-08 DIAGNOSIS — Z0000 Encounter for general adult medical examination without abnormal findings: Secondary | ICD-10-CM | POA: Diagnosis not present

## 2020-05-08 DIAGNOSIS — R319 Hematuria, unspecified: Secondary | ICD-10-CM | POA: Diagnosis not present

## 2020-05-08 DIAGNOSIS — F419 Anxiety disorder, unspecified: Secondary | ICD-10-CM | POA: Diagnosis not present

## 2020-05-08 LAB — POCT URINALYSIS DIPSTICK
Spec Grav, UA: <=1.005 — AB (ref 1.010–1.025)
pH, UA: 7 (ref 5.0–8.0)

## 2020-05-08 MED ORDER — ALPRAZOLAM 0.5 MG PO TABS: 0.5000 mg | ORAL_TABLET | Freq: Every evening | ORAL | 0 refills | Status: DC | PRN

## 2020-05-08 NOTE — Progress Notes (Signed)
Patient ID: Connor Sanford, male    DOB: June 24, 1955, 65 y.o.   MRN: [id number]   Chief Complaint  Patient presents with  . Annual Exam   Subjective:    HPI The patient comes in today for a wellness visit.  A review of their health history was completed.  A review of medications was also completed.  Any needed refills; xanax  Eating habits: health conscius  Falls/  MVA accidents in past few months: none  Regular exercise: work in yard and Oncologist pt sees on regular basis: dr berry - cardiologist  Preventative health issues were discussed.   Additional concerns: none  Slight increase on glucose 113,  Slight inc on ALT 48. Cholesterol normal and psa normal.  Had heart stent put in 11/21.  Seeing cards.  Taking plavix. Noticed some urine and was burning and then had some blood.  About 2 wks ago. Not sure if was a kidney stone. No h/o kidney stone. Next day was fine.  occ has some anxiety, clausterphobic and using for sleep.  Only taking 2-3 per month or needing procedure might need one. Some times if doing work under house can get clauster phobic and taking 1/2 tab of the 1mg  xanax. Usually only taking 1/2 tab prn.  Medical History Connor Sanford has a past medical history of Coronary artery disease, H/O seasonal allergies, Hyperlipidemia, Hypertension, and MI (myocardial infarction) ([place]) (2010).   Outpatient Encounter Medications as of 05/08/2020  Medication Sig  . ALPRAZolam (XANAX) 0.5 MG tablet Take 1 tablet (0.5 mg total) by mouth at bedtime as needed for anxiety.  Marland Kitchen aspirin EC 81 MG tablet Take 81 mg by mouth every evening.  Marland Kitchen atorvastatin (LIPITOR) 80 MG tablet Take 1 tablet (80 mg total) by mouth daily.  . clopidogrel (PLAVIX) 75 MG tablet TAKE ONE (1) TABLET BY MOUTH EVERY DAY  . fexofenadine (ALLEGRA) 180 MG tablet Take 180 mg by mouth daily as needed for allergies or rhinitis.  . fluticasone (FLONASE) 50 MCG/ACT nasal spray Place 1 spray  into both nostrils daily as needed for allergies or rhinitis.  . Ibuprofen-diphenhydrAMINE HCl (IBUPROFEN PM) 200-25 MG CAPS Take 1-2 tablets by mouth at bedtime as needed (sleep.).  Marland Kitchen metoprolol tartrate (LOPRESSOR) 25 MG tablet TAKE ONE-HALF TABLET (12.5MG  TOTAL) BY MOUTH TWO TIMES DAILY  . Multiple Vitamins-Minerals (MULTIVITAMIN WITH IRON-MINERALS) liquid Take 30 mLs by mouth daily.  Marland Kitchen OVER THE COUNTER MEDICATION coq10  . [DISCONTINUED] ALPRAZolam (XANAX) 1 MG tablet TAKE ONE TABLET (1MG  TOTAL) BY MOUTH AT BEDTIME AS NEEDED FOR SLEEP (Patient taking differently: Take 1 mg by mouth at bedtime as needed for sleep.)  . [DISCONTINUED] nitroGLYCERIN (NITROSTAT) 0.4 MG SL tablet Place 0.4 mg under the tongue every 5 (five) minutes x 3 doses as needed for chest pain.   Marland Kitchen albuterol (VENTOLIN HFA) 108 (90 Base) MCG/ACT inhaler Inhale 2 puffs into the lungs every 6 (six) hours as needed for wheezing or shortness of breath. (Patient not taking: Reported on 05/08/2020)  . [DISCONTINUED] benzonatate (TESSALON) 100 MG capsule Take 1 capsule (100 mg total) by mouth 2 (two) times daily as needed for cough.   No facility-administered encounter medications on file as of 05/08/2020.     Review of Systems  Constitutional: Negative for chills and fever.  HENT: Negative for congestion, rhinorrhea and sore throat.   Respiratory: Negative for cough, shortness of breath and wheezing.   Cardiovascular: Negative for chest pain and leg swelling.  Gastrointestinal: Negative for abdominal pain, diarrhea, nausea and vomiting.  Genitourinary: Negative for dysuria and frequency.  Skin: Negative for rash.  Neurological: Negative for dizziness, weakness and headaches.     Vitals BP 122/74   Pulse 64   Temp (!) 97.5 F (36.4 C)   Ht 5\' 10"  (1.778 m)   Wt 227 lb (103 kg)   SpO2 98%   BMI 32.57 kg/m   Objective:   Physical Exam Vitals and nursing note reviewed.  Constitutional:      General: He is not in acute  distress.    Appearance: Normal appearance. He is not ill-appearing.  HENT:     Head: Normocephalic.     Right Ear: Tympanic membrane, ear canal and external ear normal.     Left Ear: Tympanic membrane, ear canal and external ear normal.     Nose: Nose normal. No congestion.     Mouth/Throat:     Mouth: Mucous membranes are moist.     Pharynx: No oropharyngeal exudate.  Eyes:     Extraocular Movements: Extraocular movements intact.     Conjunctiva/sclera: Conjunctivae normal.     Pupils: Pupils are equal, round, and reactive to light.  Cardiovascular:     Rate and Rhythm: Normal rate and regular rhythm.     Pulses: Normal pulses.     Heart sounds: Normal heart sounds. No murmur heard.   Pulmonary:     Effort: Pulmonary effort is normal.     Breath sounds: Normal breath sounds. No wheezing, rhonchi or rales.  Abdominal:     General: Abdomen is flat. Bowel sounds are normal. There is no distension.     Palpations: Abdomen is soft. There is no mass.     Tenderness: There is no abdominal tenderness. There is no guarding or rebound.     Hernia: No hernia is present.  Musculoskeletal:        General: Normal range of motion.     Right lower leg: No edema.     Left lower leg: No edema.  Skin:    General: Skin is warm and dry.     Findings: No rash.  Neurological:     General: No focal deficit present.     Mental Status: He is alert and oriented to person, place, and time.     Cranial Nerves: No cranial nerve deficit.  Psychiatric:        Mood and Affect: Mood normal.        Behavior: Behavior normal.        Thought Content: Thought content normal.        Judgment: Judgment normal.      Assessment and Plan   1. Routine general medical examination at a health care facility  2. Hematuria, unspecified type - POCT urinalysis dipstick  3. Anxiety - ALPRAZolam (XANAX) 0.5 MG tablet; Take 1 tablet (0.5 mg total) by mouth at bedtime as needed for anxiety.  Dispense: 30 tablet;  Refill: 0  4. Screen for colon cancer - POCT Occult Blood Stool   HM- Need coloscopy soon in fall since on plavix. Needed to postpone the colonoscopy due to being on plavix and needing to be on it for 12 months per cards.  Need to be on plavix for 12 months before colonoscopy due to new stents.   Taking home 3 hemoccult cards and to return.  Reviewed risk vs benefit of taking benzo over long term.  Pt voiced understanding.  Stating only taking 2-3  per month. Taking for insomnia prn or anxiety with  Procedures. Prn. Will give small amt to last a few months. Last refill  06/13/19  Reviewed pmp.   Return in about 1 year (around 05/08/2021) for cpe.  05/21/2020

## 2020-05-16 LAB — HEMOCCULT GUIAC POC 1CARD (OFFICE)
Card #2 Fecal Occult Blod, POC: NEGATIVE
Card #3 Fecal Occult Blood, POC: NEGATIVE
Fecal Occult Blood, POC: NEGATIVE

## 2020-05-19 ENCOUNTER — Other Ambulatory Visit: Payer: Self-pay | Admitting: Family Medicine

## 2020-05-20 NOTE — Telephone Encounter (Signed)
Seen 05/08/20

## 2020-07-30 ENCOUNTER — Encounter: Payer: Self-pay | Admitting: Family Medicine

## 2020-07-30 ENCOUNTER — Ambulatory Visit (INDEPENDENT_AMBULATORY_CARE_PROVIDER_SITE_OTHER): Payer: PRIVATE HEALTH INSURANCE | Admitting: Family Medicine

## 2020-07-30 ENCOUNTER — Other Ambulatory Visit: Payer: Self-pay

## 2020-07-30 VITALS — BP 127/78 | HR 58 | Temp 98.1°F | Ht 70.0 in | Wt 231.0 lb

## 2020-07-30 DIAGNOSIS — H9192 Unspecified hearing loss, left ear: Secondary | ICD-10-CM | POA: Diagnosis not present

## 2020-07-30 DIAGNOSIS — H938X2 Other specified disorders of left ear: Secondary | ICD-10-CM | POA: Diagnosis not present

## 2020-07-30 MED ORDER — FLUTICASONE PROPIONATE 50 MCG/ACT NA SUSP: NASAL | 3 refills | Status: AC

## 2020-07-30 NOTE — Progress Notes (Signed)
   Subjective:    Patient ID: Connor Sanford, male    DOB: 1955/09/10, 64 y.o.   MRN: [id number]  HPIleft ear feels stopped up. Feels like head is in a barrell. Started 5 days ago. Tried ear wax removal drops. Has heard some ringing in ear.   Patient feels like his ear is stopped up but also relates a lot of congestion denies high fever chills sweats  Review of Systems     Objective:   Physical Exam Right eardrum normal lungs clear heart regular left eardrum normal nares are normal throat is normal neck no masses       Assessment & Plan:  Left ear fullness No sign of any wax ear infection or blockage or fluid Utilize allergy medicine along with nasal spray if not dramatically better within 2 weeks recommend ENT Go ahead with referral can always cancel if it gets better

## 2020-08-02 ENCOUNTER — Other Ambulatory Visit: Payer: Self-pay | Admitting: Family Medicine

## 2020-10-02 ENCOUNTER — Other Ambulatory Visit: Payer: Self-pay | Admitting: Physician Assistant

## 2020-10-02 DIAGNOSIS — I2510 Atherosclerotic heart disease of native coronary artery without angina pectoris: Secondary | ICD-10-CM

## 2020-10-09 ENCOUNTER — Other Ambulatory Visit: Payer: Self-pay | Admitting: Otolaryngology

## 2020-10-09 ENCOUNTER — Other Ambulatory Visit (HOSPITAL_COMMUNITY): Payer: Self-pay | Admitting: Otolaryngology

## 2020-10-09 DIAGNOSIS — H9122 Sudden idiopathic hearing loss, left ear: Secondary | ICD-10-CM

## 2020-10-14 ENCOUNTER — Other Ambulatory Visit: Payer: Self-pay | Admitting: Family Medicine

## 2020-10-14 DIAGNOSIS — F419 Anxiety disorder, unspecified: Secondary | ICD-10-CM

## 2020-10-14 NOTE — Telephone Encounter (Signed)
Pt stating on last visit taking 2-3 per month.  Pt was given 30 tab on last visit in 3/22. Will give small amt, pt to f/u with new provider regarding this medication.  Dr. Lovena Le

## 2020-10-14 NOTE — Telephone Encounter (Signed)
Pt stating on last visit taking 2-3 per month.  Pt was given 30 tab on last visit in

## 2020-10-29 ENCOUNTER — Encounter (HOSPITAL_COMMUNITY): Payer: Self-pay | Admitting: *Deleted

## 2020-10-29 ENCOUNTER — Other Ambulatory Visit: Payer: Self-pay

## 2020-10-30 ENCOUNTER — Other Ambulatory Visit: Payer: Self-pay

## 2020-10-30 ENCOUNTER — Encounter (HOSPITAL_COMMUNITY): Admission: RE | Disposition: A | Discharge status: Home / Self Care | Payer: Self-pay | Source: Home / Self Care

## 2020-10-30 ENCOUNTER — Ambulatory Visit (HOSPITAL_COMMUNITY): Payer: PRIVATE HEALTH INSURANCE | Admitting: Physician Assistant

## 2020-10-30 ENCOUNTER — Ambulatory Visit (HOSPITAL_COMMUNITY)
Admission: RE | Admit: 2020-10-30 | Discharge: 2020-10-30 | Disposition: A | Discharge status: Home / Self Care | Payer: PRIVATE HEALTH INSURANCE | Attending: Otolaryngology | Admitting: Otolaryngology

## 2020-10-30 ENCOUNTER — Encounter (HOSPITAL_COMMUNITY): Payer: Self-pay

## 2020-10-30 ENCOUNTER — Ambulatory Visit (HOSPITAL_COMMUNITY)
Admission: RE | Admit: 2020-10-30 | Discharge: 2020-10-30 | Disposition: A | Discharge status: Home / Self Care | Payer: PRIVATE HEALTH INSURANCE | Source: Ambulatory Visit | Attending: Otolaryngology | Admitting: Otolaryngology

## 2020-10-30 DIAGNOSIS — J329 Chronic sinusitis, unspecified: Secondary | ICD-10-CM | POA: Insufficient documentation

## 2020-10-30 DIAGNOSIS — H9122 Sudden idiopathic hearing loss, left ear: Secondary | ICD-10-CM | POA: Diagnosis present

## 2020-10-30 DIAGNOSIS — I6782 Cerebral ischemia: Secondary | ICD-10-CM | POA: Insufficient documentation

## 2020-10-30 HISTORY — DX: Anxiety disorder, unspecified: F41.9

## 2020-10-30 HISTORY — PX: RADIOLOGY WITH ANESTHESIA: SHX6223

## 2020-10-30 LAB — BASIC METABOLIC PANEL
Anion gap: 8 (ref 5–15)
BUN: 19 mg/dL (ref 8–23)
CO2: 22 mmol/L (ref 22–32)
Calcium: 9 mg/dL (ref 8.9–10.3)
Chloride: 107 mmol/L (ref 98–111)
Creatinine, Ser: 0.86 mg/dL (ref 0.61–1.24)
GFR, Estimated: >60 mL/min (ref >60)
Glucose, Bld: 131 mg/dL — ABNORMAL HIGH (ref 70–99)
Potassium: 4 mmol/L (ref 3.5–5.1)
Sodium: 137 mmol/L (ref 135–145)

## 2020-10-30 LAB — CBC
HCT: 42.1 % (ref 39.0–52.0)
Hemoglobin: 14.6 g/dL (ref 13.0–17.0)
MCH: 33.6 pg (ref 26.0–34.0)
MCHC: 34.7 g/dL (ref 30.0–36.0)
MCV: 97 fL (ref 80.0–100.0)
Platelets: 174 10*3/uL (ref 150–400)
RBC: 4.34 MIL/uL (ref 4.22–5.81)
RDW: 12 % (ref 11.5–15.5)
WBC: 6.8 10*3/uL (ref 4.0–10.5)
nRBC: 0 % (ref 0.0–0.2)

## 2020-10-30 SURGERY — MRI WITH ANESTHESIA
Anesthesia: General

## 2020-10-30 MED ORDER — CHLORHEXIDINE GLUCONATE 0.12 % MT SOLN: 15.0000 mL | Freq: Once | OROMUCOSAL | Status: AC

## 2020-10-30 MED ORDER — MIDAZOLAM HCL 2 MG/2ML IJ SOLN
INTRAMUSCULAR | Status: AC
  Filled 2020-10-30: qty 2

## 2020-10-30 MED ORDER — GADOBUTROL 1 MMOL/ML IV SOLN
10.0000 mL | Freq: Once | INTRAVENOUS | Status: AC | PRN
  Administered 2020-10-30: 10 mL via INTRAVENOUS

## 2020-10-30 MED ORDER — CHLORHEXIDINE GLUCONATE 0.12 % MT SOLN
OROMUCOSAL | Status: AC
  Administered 2020-10-30: 15 mL via OROMUCOSAL
  Filled 2020-10-30: qty 15

## 2020-10-30 MED ORDER — DEXAMETHASONE SODIUM PHOSPHATE 10 MG/ML IJ SOLN
INTRAMUSCULAR | Status: DC | PRN
  Administered 2020-10-30: 10 mg via INTRAVENOUS

## 2020-10-30 MED ORDER — LIDOCAINE 2% (20 MG/ML) 5 ML SYRINGE
INTRAMUSCULAR | Status: DC | PRN
Start: 2020-10-30 — End: 2020-10-30
  Administered 2020-10-30: 100 mg via INTRAVENOUS

## 2020-10-30 MED ORDER — ONDANSETRON HCL 4 MG/2ML IJ SOLN
INTRAMUSCULAR | Status: DC | PRN
  Administered 2020-10-30: 4 mg via INTRAVENOUS

## 2020-10-30 MED ORDER — PHENYLEPHRINE HCL-NACL 20-0.9 MG/250ML-% IV SOLN
INTRAVENOUS | Status: DC | PRN
  Administered 2020-10-30: 40 ug/min via INTRAVENOUS

## 2020-10-30 MED ORDER — MIDAZOLAM HCL 2 MG/2ML IJ SOLN
INTRAMUSCULAR | Status: DC | PRN
  Administered 2020-10-30: 2 mg via INTRAVENOUS

## 2020-10-30 MED ORDER — PROPOFOL 10 MG/ML IV BOLUS
INTRAVENOUS | Status: DC | PRN
  Administered 2020-10-30: 140 mg via INTRAVENOUS

## 2020-10-30 MED ORDER — ONDANSETRON HCL 4 MG/2ML IJ SOLN: 4.0000 mg | Freq: Once | INTRAMUSCULAR | Status: DC | PRN

## 2020-10-30 MED ORDER — AMISULPRIDE (ANTIEMETIC) 5 MG/2ML IV SOLN: 10.0000 mg | Freq: Once | INTRAVENOUS | Status: DC | PRN

## 2020-10-30 MED ORDER — LACTATED RINGERS IV SOLN: INTRAVENOUS | Status: DC

## 2020-10-30 MED ORDER — ORAL CARE MOUTH RINSE: 15.0000 mL | Freq: Once | OROMUCOSAL | Status: AC

## 2020-10-30 MED ORDER — FENTANYL CITRATE (PF) 250 MCG/5ML IJ SOLN
INTRAMUSCULAR | Status: AC
  Filled 2020-10-30: qty 5

## 2020-10-30 NOTE — Transfer of Care (Signed)
Immediate Anesthesia Transfer of Care Note  Patient: Connor Sanford  Procedure(s) Performed: MRI BRAIN WITH AND WITHOUT CONTRAST  Patient Location: PACU  Anesthesia Type:General  Level of Consciousness: awake and alert   Airway & Oxygen Therapy: Patient Spontanous Breathing and Patient connected to face mask oxygen  Post-op Assessment: Report given to RN and Post -op Vital signs reviewed and stable  Post vital signs: Reviewed and stable  Last Vitals:  Vitals Value Taken Time  BP 126/74 10/30/20 1129  Temp 36.4C 10/30/20 1134  Pulse 62 10/30/20 1134  Resp 12 10/30/20 1133  SpO2 99 % 10/30/20 1134  Vitals shown include unvalidated device data.  Last Pain:  Vitals:   10/30/20 0836  TempSrc:   PainSc: 0-No pain         Complications: No notable events documented.

## 2020-10-30 NOTE — Anesthesia Preprocedure Evaluation (Signed)
Anesthesia Evaluation  Patient identified by MRN, date of birth, ID band Patient awake    Reviewed: Allergy & Precautions, NPO status , Patient's Chart, lab work & pertinent test results, reviewed documented beta blocker date and time   History of Anesthesia Complications Negative for: history of anesthetic complications  Airway Mallampati: II  TM Distance: >3 FB Neck ROM: Full    Dental  (+) Dental Advisory Given, Teeth Intact   Pulmonary neg pulmonary ROS,    Pulmonary exam normal        Cardiovascular hypertension, Pt. on home beta blockers and Pt. on medications + CAD, + Past MI and + Cardiac Stents  Normal cardiovascular exam     Neuro/Psych Anxiety negative neurological ROS     GI/Hepatic negative GI ROS, Neg liver ROS,   Endo/Other  negative endocrine ROS  Renal/GU negative Renal ROS  negative genitourinary   Musculoskeletal negative musculoskeletal ROS (+)   Abdominal   Peds  Hematology ASA/Plavix   Anesthesia Other Findings  Sudden hearing loss  Reproductive/Obstetrics                            Anesthesia Physical Anesthesia Plan  ASA: 3  Anesthesia Plan: General   Post-op Pain Management:    Induction: Intravenous  PONV Risk Score and Plan: 2 and Ondansetron, Dexamethasone, Midazolam and Treatment may vary due to age or medical condition  Airway Management Planned: LMA  Additional Equipment: None  Intra-op Plan:   Post-operative Plan: Extubation in OR  Informed Consent: I have reviewed the patients History and Physical, chart, labs and discussed the procedure including the risks, benefits and alternatives for the proposed anesthesia with the patient or authorized representative who has indicated his/her understanding and acceptance.     Dental advisory given  Plan Discussed with:   Anesthesia Plan Comments:         Anesthesia Quick Evaluation

## 2020-10-30 NOTE — Anesthesia Procedure Notes (Signed)
Procedure Name: LMA Insertion Date/Time: 10/30/2020 10:13 AM Performed by: Reece Agar, CRNA Pre-anesthesia Checklist: Patient identified, Emergency Drugs available, Suction available and Patient being monitored Patient Re-evaluated:Patient Re-evaluated prior to induction Oxygen Delivery Method: Circle System Utilized Preoxygenation: Pre-oxygenation with 100% oxygen Induction Type: IV induction Ventilation: Mask ventilation without difficulty LMA: LMA inserted LMA Size: 5.0 Number of attempts: 1 Airway Equipment and Method: Bite block Placement Confirmation: positive ETCO2 Tube secured with: Tape Dental Injury: Teeth and Oropharynx as per pre-operative assessment

## 2020-10-31 ENCOUNTER — Encounter (HOSPITAL_COMMUNITY): Payer: Self-pay | Admitting: Radiology

## 2020-10-31 NOTE — Anesthesia Postprocedure Evaluation (Signed)
Anesthesia Post Note  Patient: Connor Sanford  Procedure(s) Performed: MRI BRAIN WITH AND WITHOUT CONTRAST     Patient location during evaluation: PACU Anesthesia Type: General Level of consciousness: awake and alert Pain management: pain level controlled Vital Signs Assessment: post-procedure vital signs reviewed and stable Respiratory status: spontaneous breathing, nonlabored ventilation and respiratory function stable Cardiovascular status: blood pressure returned to baseline and stable Postop Assessment: no apparent nausea or vomiting Anesthetic complications: no   No notable events documented.  Last Vitals:  Vitals:   10/30/20 1130 10/30/20 1143  BP: 126/74 116/72  Pulse: 62 61  Resp: 10 14  Temp: (!) 36.4 C 36.5 C  SpO2: 100% 100%    Last Pain:  Vitals:   10/30/20 1143  TempSrc:   PainSc: 0-No pain                 Lidia Collum

## 2021-01-28 ENCOUNTER — Other Ambulatory Visit: Payer: Self-pay | Admitting: Physician Assistant

## 2021-01-28 DIAGNOSIS — I2510 Atherosclerotic heart disease of native coronary artery without angina pectoris: Secondary | ICD-10-CM

## 2021-02-11 ENCOUNTER — Other Ambulatory Visit: Payer: Self-pay

## 2021-02-11 ENCOUNTER — Encounter: Payer: Self-pay | Admitting: Cardiovascular Disease

## 2021-02-11 ENCOUNTER — Ambulatory Visit (INDEPENDENT_AMBULATORY_CARE_PROVIDER_SITE_OTHER): Payer: No Typology Code available for payment source | Admitting: Cardiovascular Disease

## 2021-02-11 DIAGNOSIS — E782 Mixed hyperlipidemia: Secondary | ICD-10-CM | POA: Diagnosis not present

## 2021-02-11 DIAGNOSIS — I2510 Atherosclerotic heart disease of native coronary artery without angina pectoris: Secondary | ICD-10-CM

## 2021-02-11 NOTE — Assessment & Plan Note (Signed)
History of hyperlipidemia on high-dose statin therapy with lipid profile performed 05/02/2020 revealing total cholesterol 115, LDL 62 and HDL 34.

## 2021-02-11 NOTE — Patient Instructions (Signed)

## 2021-02-11 NOTE — Assessment & Plan Note (Signed)
History of CAD status post MI on 01/27/2009 secondary to an occluded second diagonal branch which I stented using 2 overlapping bare-metal stents.  His EF initially was 45 to 50% which normalized over time.  Because of predictable exertional chest pain and a Myoview performed 12/28/2019 that was remarkable for inferolateral ischemia I performed diagnostic cath on him 01/03/2020 revealing a patent diagonal branch stent with a 95% proximal AV groove circumflex stenosis which I stented with a 3 mm x 16 mm long Synergy drug-eluting stent postdilated to 3.3 mm.  He is totally asymptomatic on dual antiplatelet therapy.

## 2021-02-11 NOTE — Progress Notes (Signed)
02/11/2021 Connor Sanford   [birth year]  [id number]  Primary Physician Erven Colla, DO Primary Cardiologist: [name] MD Garret Reddish, [place], [place]  HPI:  Connor Sanford is a 65 y.o.   mildly overweight married Caucasian male, father of 2, who retired on Jun 29 2012 from working at the [employer]. I last saw him in the office 01/15/2020. He has a history of hyperlipidemia as well as a strong family history of heart disease and a father who had bypass surgery age 2. 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 He did have moderate inferolateral hypokinesia with an EF of 45% to 50%, with a peak CPK of 1000 and MB of 143. Echocardiogram performed April 06, 2009, showed normal LV systolic function without segmental wall motion abnormalities, and Myoview showed inferoapical scar without ischemia. He did participate in cardiac .  He did fall down in January and injured his left shoulder and now requiring rotator cuff repair. A Myoview stress test performed on 05/23/13 for preoperative clearance revealed a small area of apical scar with preserved ejection fraction.   He had  developed dyspnea on exertion and predictable exertional chest pain.  He saw Coletta Memos, FNP in the office who ordered a Myoview stress test on 12/28/2019 there is remarkable for inferolateral ischemia.  Because of this he was referred for diagnostic coronary angiography which I performed on 01/03/2020. This showed a patent diagonal branch stent with a 95% proximal AV groove circumflex stenosis which I stented using a synergy 3 mm x 60 mm long drug-eluting stent postdilated to 3.3 mm. His symptoms have subsequently completely resolved.  Since I saw him a year ago he continues to do well.  He does complain of some generalized aches and pains which he attributes to arthritis and getting old.  He denies  chest pain or shortness of breath.  I did increase his atorvastatin when I saw him last which resulted in marked improvement in his lipid profile.   Current Meds  Medication Sig   ALPRAZolam (XANAX) 0.5 MG tablet Take 1 tab p.o. prn anxiety. (Patient taking differently: Take 0.5 mg by mouth at bedtime as needed for anxiety or sleep. Take 1 tab p.o. prn anxiety.)   aspirin EC 81 MG tablet Take 81 mg by mouth every evening.   atorvastatin (LIPITOR) 80 MG tablet TAKE ONE TABLET (80MG  TOTAL) BY MOUTH DAILY (Patient taking differently: Take 80 mg by mouth daily.)   clopidogrel (PLAVIX) 75 MG tablet TAKE ONE (1) TABLET BY MOUTH EVERY DAY. Please keep upcoming appt in December 2022 with Dr. Gwenlyn Found before anymore refills. Thank you   Coenzyme Q10 (COQ10) 100 MG CAPS Take 100 mg by mouth daily.   fexofenadine (ALLEGRA) 180 MG tablet Take 180 mg by mouth daily as needed for allergies or rhinitis.   fluticasone (FLONASE) 50 MCG/ACT nasal spray 2 sprays each nare daily (Patient taking differently: Place 2 sprays into both nostrils daily as needed for allergies.)   Ibuprofen-diphenhydrAMINE HCl (IBUPROFEN PM) 200-25 MG CAPS Take 1-2 tablets by mouth at bedtime as needed (sleep.).   Menthol, Topical Analgesic, (BIOFREEZE) 4 % GEL Apply 1 application topically daily as needed (pain).   metoprolol tartrate (LOPRESSOR) 25 MG tablet TAKE ONE-HALF TABLET (12.5MG  TOTAL) BY MOUTH TWO TIMES DAILY (Patient taking differently: Take 12.5 mg by mouth 2 (two) times daily.)   Multiple Vitamins-Minerals (  MULTIVITAMIN WITH IRON-MINERALS) liquid Take 30 mLs by mouth daily.   nitroGLYCERIN (NITROSTAT) 0.4 MG SL tablet PLACE ONE TABLET (0.4MG  TOTAL) UNDER THETONGUE EVERY FIVE MINUTES AS NEEDED FOR CHEST PAIN (Patient taking differently: Place 0.4 mg under the tongue every 5 (five) minutes as needed for chest pain.)     No Known Allergies  Social History   Socioeconomic History   Marital status: Married    Spouse name: Not on  file   Number of children: Not on file   Years of education: Not on file   Highest education level: Not on file  Occupational History   Not on file  Tobacco Use   Smoking status: Never   Smokeless tobacco: Never  Substance and Sexual Activity   Alcohol use: No   Drug use: No   Sexual activity: Yes    Birth control/protection: None  Other Topics Concern   Not on file  Social History Narrative   Not on file   Social Determinants of Health   Financial Resource Strain: Not on file  Food Insecurity: Not on file  Transportation Needs: Not on file  Physical Activity: Not on file  Stress: Not on file  Social Connections: Not on file  Intimate Partner Violence: Not on file     Review of Systems: General: negative for chills, fever, night sweats or weight changes.  Cardiovascular: negative for chest pain, dyspnea on exertion, edema, orthopnea, palpitations, paroxysmal nocturnal dyspnea or shortness of breath Dermatological: negative for rash Respiratory: negative for cough or wheezing Urologic: negative for hematuria Abdominal: negative for nausea, vomiting, diarrhea, bright red blood per rectum, melena, or hematemesis Neurologic: negative for visual changes, syncope, or dizziness All other systems reviewed and are otherwise negative except as noted above.    Blood pressure 122/60, pulse 62, height 5\' 10"  (1.778 m), weight 33 lb 6.4 oz (15.2 kg), SpO2 97 %.  General appearance: alert and no distress Neck: no adenopathy, no carotid bruit, no JVD, supple, symmetrical, trachea midline, and thyroid not enlarged, symmetric, no tenderness/mass/nodules Lungs: clear to auscultation bilaterally Heart: regular rate and rhythm, S1, S2 normal, no murmur, click, rub or gallop Extremities: extremities normal, atraumatic, no cyanosis or edema Pulses: 2+ and symmetric Skin: Skin color, texture, turgor normal. No rashes or lesions Neurologic: Grossly normal  EKG sinus rhythm at 62 with  rightward axis and low limb voltage.  I personally reviewed this EKG.  ASSESSMENT AND PLAN:   Hyperlipidemia History of hyperlipidemia on high-dose statin therapy with lipid profile performed 05/02/2020 revealing total cholesterol 115, LDL 62 and HDL 34.  CAD (coronary artery disease) History of CAD status post MI on 01/27/2009 secondary to an occluded second diagonal branch which I stented using 2 overlapping bare-metal stents.  His EF initially was 45 to 50% which normalized over time.  Because of predictable exertional chest pain and a Myoview performed 12/28/2019 that was remarkable for inferolateral ischemia I performed diagnostic cath on him 01/03/2020 revealing a patent diagonal branch stent with a 95% proximal AV groove circumflex stenosis which I stented with a 3 mm x 16 mm long Synergy drug-eluting stent postdilated to 3.3 mm.  He is totally asymptomatic on dual antiplatelet therapy.     [name] MD FACP,FACC,FAHA, [facility] 02/11/2021 9:24 AM

## 2021-02-26 ENCOUNTER — Ambulatory Visit: Payer: PRIVATE HEALTH INSURANCE | Admitting: Family Medicine

## 2021-02-28 ENCOUNTER — Other Ambulatory Visit: Payer: Self-pay

## 2021-02-28 ENCOUNTER — Ambulatory Visit
Admission: EM | Admit: 2021-02-28 | Discharge: 2021-02-28 | Disposition: A | Discharge status: Home / Self Care | Payer: PRIVATE HEALTH INSURANCE | Attending: Family Medicine | Admitting: Family Medicine

## 2021-02-28 DIAGNOSIS — H1032 Unspecified acute conjunctivitis, left eye: Secondary | ICD-10-CM | POA: Diagnosis not present

## 2021-02-28 MED ORDER — CIPROFLOXACIN HCL 0.3 % OP SOLN: 2.0000 [drp] | OPHTHALMIC | 0 refills | Status: DC

## 2021-02-28 NOTE — ED Provider Notes (Signed)
[facility]    CSN: [id number] Arrival date & time: 02/28/21  3559      History   Chief Complaint Chief Complaint  Patient presents with   Eye Problem    HPI Connor Sanford is a 65 y.o. male.   Presenting today with 1 day history of left eye redness, irritation, drainage, swelling to the periorbital area.  Denies fever, chills, headache, injury to the eye, foreign body in eye, visual changes, nausea, vomiting.  Does wear contact lenses and states he gets something like this every so often.  Typically resolved with antibiotics.  Has been trying allergy drops and eye flushes with no relief.   Past Medical History:  Diagnosis Date   Anxiety    Coronary artery disease    H/O seasonal allergies    Hyperlipidemia    Hypertension    MI (myocardial infarction) ([place]) 03/01/2008    Patient Active Problem List   Diagnosis Date Noted   COVID-19 virus infection 03/19/2020   Cough 03/19/2020   CAD (coronary artery disease) 01/03/2020   Abnormal nuclear stress test    Hyperlipidemia 06/21/2014   Hx of repair of left rotator cuff 12/27/2013   S/P rotator cuff repair 06/18/2013   S/P shoulder surgery 06/18/2013   Arthritis, shoulder region 06/18/2013   Rotator cuff tear, left 03/29/2013   Complete rotator cuff tear of left shoulder 03/29/2013   Contusion of left shoulder 03/15/2013   Left shoulder pain 03/15/2013   Atherosclerotic heart disease 10/16/2012   Other and unspecified hyperlipidemia 10/16/2012    Past Surgical History:  Procedure Laterality Date   CARDIAC CATHETERIZATION  01/27/2009   Occluded second diagonal branch-stented with a 2.25x76m Integrity Medtronic bare-metal stent at 12 atm (2.38-mm) resulting in reduction of 100% stenosis to 0% residual. Small fairly focal stenosis just distalof stented segment-stented with a 2.25x853mIntegrity Medtronic bare-metal stent at 12 atm, reduced to 0% residual with TIMI3 flow.   CARDIOVASCULAR STRESS TEST   04/16/2009   Mild perfusion defect seen in Mid Anterior, Apical Anterior, and Apical regions - consistent with infarct/scar. No scintigraphic evidence of inducible myocardial ischemia. EKG negative for ischemia. No ECG changes.   COLONOSCOPY N/A 12/27/2012   Procedure: COLONOSCOPY;  Surgeon: NaRogene HoustonMD;  [facility];  Service: Endoscopy;  Laterality: N/A;  730   CORONARY ANGIOPLASTY     2 stents   CORONARY STENT INTERVENTION  01/03/2020   Procedure: CORONARY STENT INTERVENTION;  Surgeon: BeLorretta HarpMD;  [facility];  Service: Cardiovascular;;   LEFT HEART CATH AND CORONARY ANGIOGRAPHY N/A 01/03/2020   Procedure: LEFT HEART CATH AND CORONARY ANGIOGRAPHY;  Surgeon: BeLorretta HarpMD;  [facility];  Service: Cardiovascular;  Laterality: N/A;   RADIOLOGY WITH ANESTHESIA N/A 10/30/2020   Procedure: MRI BRAIN WITH AND WITHOUT CONTRAST;  Surgeon: Radiologist, Medication, MD;  [facility]: Radiology;  Laterality: N/A;   RESECTION DISTAL CLAVICAL Left 06/13/2013   Procedure: RESECTION DISTAL CLAVICAL;  Surgeon: StCarole CivilMD;  Location: [facility];  Service: Orthopedics;  Laterality: Left;   SHOULDER OPEN ROTATOR CUFF REPAIR Left 06/13/2013   Procedure: ROTATOR CUFF REPAIR SHOULDER OPEN;  Surgeon: StCarole CivilMD;  Location: [facility];  Service: Orthopedics;  Laterality: Left;   TRANSTHORACIC ECHOCARDIOGRAM  04/16/2009   EF >55%, no significant valvular abnormalities.       Home Medications    Prior to Admission medications  Medication Sig Start Date End Date Taking? Authorizing Provider  ciprofloxacin (CILOXAN) 0.3 % ophthalmic solution Place 2 drops into the left eye every 4 (four) hours while awake. 02/28/21  Yes Volney American, PA-C  ALPRAZolam Duanne Moron) 0.5 MG tablet Take 1 tab p.o. prn anxiety. Patient taking differently: Take 0.5 mg by mouth at bedtime as needed for anxiety or sleep. Take 1 tab p.o.  prn anxiety. 10/14/20   Erven Colla, DO  aspirin EC 81 MG tablet Take 81 mg by mouth every evening.    [provider]  atorvastatin (LIPITOR) 80 MG tablet TAKE ONE TABLET (80MG TOTAL) BY MOUTH DAILY Patient taking differently: Take 80 mg by mouth daily. 10/02/20   Bhagat, Crista Luria, PA  clopidogrel (PLAVIX) 75 MG tablet TAKE ONE (1) TABLET BY MOUTH EVERY DAY. Please keep upcoming appt in December 2022 with Dr. Gwenlyn Found before anymore refills. Thank you 01/28/21   [name], MD  Coenzyme Q10 (COQ10) 100 MG CAPS Take 100 mg by mouth daily.    [provider]  fexofenadine (ALLEGRA) 180 MG tablet Take 180 mg by mouth daily as needed for allergies or rhinitis.    [provider]  fluticasone (FLONASE) 50 MCG/ACT nasal spray 2 sprays each nare daily Patient taking differently: Place 2 sprays into both nostrils daily as needed for allergies. 07/30/20   Kathyrn Drown, MD  Ibuprofen-diphenhydrAMINE HCl (IBUPROFEN PM) 200-25 MG CAPS Take 1-2 tablets by mouth at bedtime as needed (sleep.).    [provider]  Menthol, Topical Analgesic, (BIOFREEZE) 4 % GEL Apply 1 application topically daily as needed (pain).    [provider]  metoprolol tartrate (LOPRESSOR) 25 MG tablet TAKE ONE-HALF TABLET (12.5MG TOTAL) BY MOUTH TWO TIMES DAILY Patient taking differently: Take 12.5 mg by mouth 2 (two) times daily. 03/26/20   [name], MD  Multiple Vitamins-Minerals (MULTIVITAMIN WITH IRON-MINERALS) liquid Take 30 mLs by mouth daily.    [provider]  nitroGLYCERIN (NITROSTAT) 0.4 MG SL tablet PLACE ONE TABLET (0.4MG TOTAL) UNDER THETONGUE EVERY FIVE MINUTES AS NEEDED FOR CHEST PAIN Patient taking differently: Place 0.4 mg under the tongue every 5 (five) minutes as needed for chest pain. 08/04/20   Erven Colla, DO    Family History Family History  Problem Relation Age of Onset   Heart disease Father    Heart disease Paternal Grandmother     Colon cancer Neg Hx     Social History Social History   Tobacco Use   Smoking status: Never   Smokeless tobacco: Never  Vaping Use   Vaping Use: Never used  Substance Use Topics   Alcohol use: No   Drug use: No     Allergies   Patient has no known allergies.   Review of Systems Review of Systems Per HPI  Physical Exam Triage Vital Signs ED Triage Vitals  Enc Vitals Group     BP 02/28/21 0908 115/70     Pulse Rate 02/28/21 0908 (!) 57     Resp 02/28/21 0908 18     Temp 02/28/21 0908 98 F (36.7 C)     Temp Source 02/28/21 0908 Oral     SpO2 02/28/21 0908 96 %     Weight --      Height --      Head Circumference --      Peak Flow --      Pain Score 02/28/21 0904 1     Pain Loc --  Pain Edu? --      Excl. in [place]? --    No data found.  Updated Vital Signs BP 115/70 (BP Location: Right Arm)    Pulse (!) 57    Temp 98 F (36.7 C) (Oral)    Resp 18    SpO2 96%   Visual Acuity Right Eye Distance:   Left Eye Distance:   Bilateral Distance:    Right Eye Near:   Left Eye Near:    Bilateral Near:     Physical Exam Vitals and nursing note reviewed.  Constitutional:      Appearance: Normal appearance.  HENT:     Head: Atraumatic.  Eyes:     Extraocular Movements: Extraocular movements intact.     Pupils: Pupils are equal, round, and reactive to light.     Comments: Left eye with conjunctival erythema, edema to periorbital region particularly lower lid left eye.  Copious discharge.  Right eye benign  Cardiovascular:     Rate and Rhythm: Normal rate and regular rhythm.  Pulmonary:     Effort: Pulmonary effort is normal.     Breath sounds: Normal breath sounds.  Musculoskeletal:        General: Normal range of motion.     Cervical back: Normal range of motion and neck supple.  Skin:    General: Skin is warm and dry.  Neurological:     General: No focal deficit present.     Mental Status: He is oriented to person, place, and time.  Psychiatric:         Mood and Affect: Mood normal.        Thought Content: Thought content normal.        Judgment: Judgment normal.     UC Treatments / Results  Labs (all labs ordered are listed, but only abnormal results are displayed) Labs Reviewed - No data to display  EKG   Radiology No results found.  Procedures Procedures (including critical care time)  Medications Ordered in UC Medications - No data to display  Initial Impression / Assessment and Plan / UC Course  I have reviewed the triage vital signs and the nursing notes.  Pertinent labs & imaging results that were available during my care of the patient were reviewed by me and considered in my medical decision making (see chart for details).     We will treat for contact lens conjunctivitis with Cipro drops.  Avoid use of contacts until fully resolved.  Warm compresses, lubricating drops also recommended.  Final Clinical Impressions(s) / UC Diagnoses   Final diagnoses:  Acute bacterial conjunctivitis of left eye   Discharge Instructions   None    ED Prescriptions     Medication Sig Dispense Auth. Provider   ciprofloxacin (CILOXAN) 0.3 % ophthalmic solution Place 2 drops into the left eye every 4 (four) hours while awake. 5 mL Volney American, [place]      PDMP not reviewed this encounter.   Merrie Roof [employer], [place] 02/28/21 [phone]

## 2021-02-28 NOTE — ED Triage Notes (Signed)
Patient states he has some type of infection in his left eye since yesterday.  Patient states it has been running fluids  Denies fever.  Denies Injury

## 2021-03-04 ENCOUNTER — Ambulatory Visit (INDEPENDENT_AMBULATORY_CARE_PROVIDER_SITE_OTHER): Payer: PRIVATE HEALTH INSURANCE | Admitting: Family Medicine

## 2021-03-04 ENCOUNTER — Other Ambulatory Visit: Payer: Self-pay

## 2021-03-04 VITALS — BP 131/76 | HR 60 | Temp 98.8°F | Ht 70.0 in | Wt 230.6 lb

## 2021-03-04 DIAGNOSIS — F419 Anxiety disorder, unspecified: Secondary | ICD-10-CM

## 2021-03-04 DIAGNOSIS — J988 Other specified respiratory disorders: Secondary | ICD-10-CM | POA: Diagnosis not present

## 2021-03-04 DIAGNOSIS — N50811 Right testicular pain: Secondary | ICD-10-CM

## 2021-03-04 DIAGNOSIS — W540XXA Bitten by dog, initial encounter: Secondary | ICD-10-CM | POA: Diagnosis not present

## 2021-03-04 DIAGNOSIS — B9789 Other viral agents as the cause of diseases classified elsewhere: Secondary | ICD-10-CM

## 2021-03-04 DIAGNOSIS — E785 Hyperlipidemia, unspecified: Secondary | ICD-10-CM | POA: Insufficient documentation

## 2021-03-04 LAB — POCT URINALYSIS DIPSTICK
Bilirubin, UA: NEGATIVE
Blood, UA: NEGATIVE
Glucose, UA: NEGATIVE
Ketones, UA: NEGATIVE
Leukocytes, UA: NEGATIVE
Nitrite, UA: NEGATIVE
Protein, UA: NEGATIVE
Spec Grav, UA: 1.01 (ref 1.010–1.025)
Urobilinogen, UA: 0.2 E.U./dL
pH, UA: 5.5 (ref 5.0–8.0)

## 2021-03-04 MED ORDER — ALPRAZOLAM 0.5 MG PO TABS: 0.5000 mg | ORAL_TABLET | Freq: Every evening | ORAL | 0 refills | Status: DC | PRN

## 2021-03-04 MED ORDER — AMOXICILLIN-POT CLAVULANATE 875-125 MG PO TABS: 1.0000 | ORAL_TABLET | Freq: Two times a day (BID) | ORAL | 0 refills | Status: DC

## 2021-03-04 NOTE — Patient Instructions (Signed)
Antibiotic as prescribed.  We will be in touch regarding the [place].  Follow up in 6 months or sooner if needed.  Take care  Dr. Lacinda Axon

## 2021-03-04 NOTE — Assessment & Plan Note (Signed)
Exam benign.  Sending for ultrasound for further evaluation.

## 2021-03-04 NOTE — Assessment & Plan Note (Signed)
No evidence of bacterial respiratory infection.  Advised supportive care.

## 2021-03-04 NOTE — Progress Notes (Signed)
Subjective:  Patient ID: Connor Sanford, male    DOB: 1955-06-22  Age: 66 y.o. MRN: [id number]  CC: Chief Complaint  Patient presents with   Groin Pain    Has been going on for about a month. Noticed blood in urine about a month ago. No blood since then. No pain or burning with urination. Has a sore throat and cough Broke up dog fight and got bit on right hand    HPI:  66 year old male with CAD and hyperlipidemia presents for follow-up.  He has 3 issues/concerns today.  Respiratory symptoms Patient reports a 2 to 3-day history of sore throat, stuffy nose.  He now has a mild cough as well.  No documented fever.  No reported sick contacts.  No relieving factors.  Right testicle pain/right groin pain Patient reports intermittent right testicle pain and right groin pain for the past month.  He states that he recently had 1 day of hematuria but this was subsequently resolved.  He states that his pain recently worsened after he broke up a dog fight.  Denies testicular swelling.  Reports of bulge in inguinal region.  Denies recent heavy lifting.  Dog bite Patient reports that Sunday morning he broke up a dog fight and suffered dog bites to the dorsum of the left hand.  He reports that his pain and swelling have improved, however hand is still quite swollen.  No drainage from the wounds.  No fever.   Patient Active Problem List   Diagnosis Date Noted   Hyperlipidemia 03/04/2021   Pain in right testicle 03/04/2021   Viral respiratory infection 03/04/2021   Dog bite 03/04/2021   CAD (coronary artery disease) 01/03/2020   Hx of repair of left rotator cuff 12/27/2013    Social Hx   Social History   Socioeconomic History   Marital status: Married    Spouse name: Not on file   Number of children: Not on file   Years of education: Not on file   Highest education level: Not on file  Occupational History   Not on file  Tobacco Use   Smoking status: Never   Smokeless tobacco:  Never  Vaping Use   Vaping Use: Never used  Substance and Sexual Activity   Alcohol use: No   Drug use: No   Sexual activity: Yes    Birth control/protection: None  Other Topics Concern   Not on file  Social History Narrative   Not on file   Social Determinants of Health   Financial Resource Strain: Not on file  Food Insecurity: Not on file  Transportation Needs: Not on file  Physical Activity: Not on file  Stress: Not on file  Social Connections: Not on file    Review of Systems Per HPI  Objective:  BP 131/76    Pulse 60    Temp 98.8 F (37.1 C) (Oral)    Ht 5\' 10"  (1.778 m)    Wt 230 lb 9.6 oz (104.6 kg)    SpO2 99%    BMI 33.09 kg/m   BP/Weight 03/04/2021 02/28/2021 32/44/0102  Systolic BP 725 366 440  Diastolic BP 76 70 60  Wt. (Lbs) 230.6 - 33.4  BMI 33.09 - 4.79    Physical Exam Vitals and nursing note reviewed.  Constitutional:      General: He is not in acute distress.    Appearance: Normal appearance. He is not ill-appearing.  HENT:     Head: Normocephalic and atraumatic.  Mouth/Throat:     Pharynx: Oropharynx is clear. No oropharyngeal exudate.  Cardiovascular:     Rate and Rhythm: Normal rate and regular rhythm.     Heart sounds: No murmur heard. Pulmonary:     Effort: Pulmonary effort is normal.     Breath sounds: No wheezing, rhonchi or rales.  Abdominal:     Hernia: There is no hernia in the right inguinal area.  Genitourinary:    Testes:        Right: Tenderness present. Mass or swelling not present.  Skin:    Comments: Dorsum of left hand with significant swelling.  Healing wounds noted.  No drainage from wounds.  Neurological:     Mental Status: He is alert.  Psychiatric:        Mood and Affect: Mood normal.        Behavior: Behavior normal.    Lab Results  Component Value Date   WBC 6.8 10/30/2020   HGB 14.6 10/30/2020   HCT 42.1 10/30/2020   PLT 174 10/30/2020   GLUCOSE 131 (H) 10/30/2020   CHOL 115 05/02/2020   TRIG 98  05/02/2020   HDL 34 (L) 05/02/2020   LDLCALC 62 05/02/2020   ALT 48 (H) 05/02/2020   AST 36 05/02/2020   NA 137 10/30/2020   K 4.0 10/30/2020   CL 107 10/30/2020   CREATININE 0.86 10/30/2020   BUN 19 10/30/2020   CO2 22 10/30/2020   TSH 2.161 Test methodology is 3rd generation TSH 01/27/2009   PSA 0.44 10/20/2012   HGBA1C  01/27/2009    5.7 (NOTE) The ADA recommends the following therapeutic goal for glycemic control related to Hgb A1c measurement: Goal of therapy: <6.5 Hgb A1c  Reference: American Diabetes Association: Clinical Practice Recommendations 2010, Diabetes Care, 2010, 33: (Suppl  1).     Assessment & Plan:   Problem List Items Addressed This Visit       Respiratory   Viral respiratory infection    No evidence of bacterial respiratory infection.  Advised supportive care.        Other   Pain in right testicle - Primary    Exam benign.  Sending for ultrasound for further evaluation.      Relevant Orders   US SCROTUM W/DOPPLER   POCT urinalysis dipstick (Completed)   Dog bite    Placing on empiric Augmentin.      Other Visit Diagnoses     Anxiety       Relevant Medications   ALPRAZolam (XANAX) 0.5 MG tablet       Meds ordered this encounter  Medications   amoxicillin-clavulanate (AUGMENTIN) 875-125 MG tablet    Sig: Take 1 tablet by mouth 2 (two) times daily.    Dispense:  14 tablet    Refill:  0   ALPRAZolam (XANAX) 0.5 MG tablet    Sig: Take 1 tablet (0.5 mg total) by mouth at bedtime as needed for anxiety or sleep.    Dispense:  30 tablet    Refill:  0    Follow-up:  6 months  [place] DO [place]

## 2021-03-04 NOTE — Assessment & Plan Note (Signed)
Placing on empiric Augmentin.

## 2021-03-05 ENCOUNTER — Other Ambulatory Visit: Payer: Self-pay | Admitting: Family Medicine

## 2021-03-05 DIAGNOSIS — N50811 Right testicular pain: Secondary | ICD-10-CM

## 2021-03-06 ENCOUNTER — Other Ambulatory Visit (HOSPITAL_COMMUNITY): Payer: Self-pay | Admitting: Internal Medicine

## 2021-03-06 ENCOUNTER — Other Ambulatory Visit: Payer: Self-pay

## 2021-03-06 ENCOUNTER — Ambulatory Visit (HOSPITAL_COMMUNITY)
Admission: RE | Admit: 2021-03-06 | Discharge: 2021-03-06 | Disposition: A | Discharge status: Home / Self Care | Payer: PRIVATE HEALTH INSURANCE | Source: Ambulatory Visit | Attending: Family Medicine | Admitting: Family Medicine

## 2021-03-06 ENCOUNTER — Other Ambulatory Visit (HOSPITAL_COMMUNITY): Payer: Self-pay | Admitting: Family Medicine

## 2021-03-06 ENCOUNTER — Other Ambulatory Visit (HOSPITAL_COMMUNITY): Payer: Self-pay | Admitting: Physician Assistant

## 2021-03-06 DIAGNOSIS — N50811 Right testicular pain: Secondary | ICD-10-CM | POA: Insufficient documentation

## 2021-03-26 ENCOUNTER — Ambulatory Visit (INDEPENDENT_AMBULATORY_CARE_PROVIDER_SITE_OTHER): Payer: PRIVATE HEALTH INSURANCE | Admitting: Nurse Practitioner

## 2021-03-26 ENCOUNTER — Other Ambulatory Visit: Payer: Self-pay

## 2021-03-26 ENCOUNTER — Encounter: Payer: Self-pay | Admitting: Nurse Practitioner

## 2021-03-26 VITALS — BP 139/76 | HR 69 | Temp 97.8°F | Ht 70.0 in | Wt 225.0 lb

## 2021-03-26 DIAGNOSIS — H1033 Unspecified acute conjunctivitis, bilateral: Secondary | ICD-10-CM

## 2021-03-26 DIAGNOSIS — J0191 Acute recurrent sinusitis, unspecified: Secondary | ICD-10-CM

## 2021-03-26 MED ORDER — CIPROFLOXACIN HCL 0.3 % OP SOLN: 1.0000 [drp] | OPHTHALMIC | 0 refills | Status: DC

## 2021-03-26 MED ORDER — DOXYCYCLINE HYCLATE 100 MG PO TABS: 100.0000 mg | ORAL_TABLET | Freq: Two times a day (BID) | ORAL | 0 refills | Status: AC

## 2021-03-26 NOTE — Progress Notes (Signed)
Subjective:    Patient ID: Connor Sanford, male    DOB: February 01, 1956, 66 y.o.   MRN: [id number]  HPI  Patient presents to the clinic today for cough and congestion that been occurring on and off for over the past months.  Patient states that its worsened 2 days ago.  Patient admits to productive cough, sore throat, nasal congestion.  Patient denies fever, chills, body aches, shortness of breath.  He has been taking extra strength Mucinex and Sudafed with little to no benefit.  Patient also states that he developed bilateral eye redness and discharge that started yesterday.  Patient admits to itchiness and burning.  Patient denies any changes to his vision.  Review of Systems  HENT:  Positive for congestion.   Eyes:  Positive for discharge, redness and itching.  Respiratory:  Positive for cough.   All other systems reviewed and are negative.     Objective:   Physical Exam Constitutional:      General: He is not in acute distress.    Appearance: Normal appearance. He is obese. He is not ill-appearing or toxic-appearing.  HENT:     Head: Normocephalic.     Right Ear: Tympanic membrane, ear canal and external ear normal. There is no impacted cerumen.     Left Ear: Tympanic membrane, ear canal and external ear normal. There is no impacted cerumen.     Nose: Congestion and rhinorrhea present.     Mouth/Throat:     Mouth: Mucous membranes are moist.     Pharynx: No oropharyngeal exudate or posterior oropharyngeal erythema.  Eyes:     General:        Right eye: Discharge present.        Left eye: Discharge present.    Extraocular Movements: Extraocular movements intact.     Conjunctiva/sclera:     Right eye: Right conjunctiva is injected. No exudate or hemorrhage.    Left eye: Left conjunctiva is injected. No exudate or hemorrhage.    Pupils: Pupils are equal, round, and reactive to light.  Cardiovascular:     Rate and Rhythm: Normal rate and regular rhythm.     Pulses: Normal  pulses.     Heart sounds: Normal heart sounds. No murmur heard. Pulmonary:     Effort: Pulmonary effort is normal. No respiratory distress.     Breath sounds: No wheezing.  Musculoskeletal:        General: Normal range of motion.     Cervical back: Normal range of motion. No rigidity or tenderness.  Lymphadenopathy:     Cervical: No cervical adenopathy.  Skin:    General: Skin is warm.     Capillary Refill: Capillary refill takes less than 2 seconds.  Neurological:     General: No focal deficit present.     Mental Status: He is alert and oriented to person, place, and time.  Psychiatric:        Mood and Affect: Mood normal.        Behavior: Behavior normal.          Assessment & Plan:   1. Acute recurrent sinusitis, unspecified location -Likely bacterial sinusitis that was not cleared with amoxicillin. - doxycycline (VIBRA-TABS) 100 MG tablet; Take 1 tablet (100 mg total) by mouth 2 (two) times daily for 7 days.  Dispense: 14 tablet; Refill: 0 - COVID-19, Flu A+B and RSV -Return to clinic if symptoms persist or worsen. -Return to clinic if symptoms not better with 3 to  4 days take antibiotics.   2. Acute bacterial conjunctivitis of both eyes -Likely bacterial conjunctivitis - ciprofloxacin (CILOXAN) 0.3 % ophthalmic solution; Place 1 drop into both eyes every 4 (four) hours while awake. Administer 1 drop, every 2 hours, while awake, for 2 days. Then 1 drop, every 4 hours, while awake, for the next 5 days.  Dispense: 5 mL; Refill: 0 -Return to clinic if symptoms not better or if worsening.

## 2021-03-28 LAB — COVID-19, FLU A+B AND RSV
Influenza A, NAA: NOT DETECTED
Influenza B, NAA: NOT DETECTED
RSV, NAA: NOT DETECTED
SARS-CoV-2, NAA: NOT DETECTED

## 2021-03-28 NOTE — Progress Notes (Signed)
COVID, Flu, and RSV negative. I hope you are starting to feel better.  Barbee Shropshire

## 2021-03-31 ENCOUNTER — Other Ambulatory Visit: Payer: Self-pay | Admitting: Cardiovascular Disease

## 2021-03-31 DIAGNOSIS — I2510 Atherosclerotic heart disease of native coronary artery without angina pectoris: Secondary | ICD-10-CM

## 2021-04-07 ENCOUNTER — Telehealth: Payer: Self-pay | Admitting: Cardiovascular Disease

## 2021-04-07 DIAGNOSIS — I2510 Atherosclerotic heart disease of native coronary artery without angina pectoris: Secondary | ICD-10-CM

## 2021-04-07 MED ORDER — ATORVASTATIN CALCIUM 80 MG PO TABS: 80.0000 mg | ORAL_TABLET | Freq: Every day | ORAL | 6 refills | Status: DC

## 2021-04-07 NOTE — Telephone Encounter (Signed)
°*  STAT* If patient is at the pharmacy, call can be transferred to refill team.   1. Which medications need to be refilled? (please list name of each medication and dose if known)  atorvastatin (LIPITOR) 80 MG tablet 30 ds   metoprolol tartrate (LOPRESSOR) 25 MG tablet 90 ds   clopidogrel (PLAVIX) 75 MG tablet 90 ds  2. Which pharmacy/location (including street and city if local pharmacy) is medication to be sent to?  [place], [place]  Phone: [phone] Fax: [phone]  3. Do they need a 30 day or 90 day supply? Listed above

## 2021-04-29 ENCOUNTER — Other Ambulatory Visit: Payer: Self-pay | Admitting: Cardiovascular Disease

## 2021-04-29 DIAGNOSIS — I2510 Atherosclerotic heart disease of native coronary artery without angina pectoris: Secondary | ICD-10-CM

## 2021-07-06 ENCOUNTER — Other Ambulatory Visit: Payer: Self-pay | Admitting: Family Medicine

## 2021-07-24 ENCOUNTER — Ambulatory Visit
Admission: RE | Admit: 2021-07-24 | Discharge: 2021-07-24 | Disposition: A | Discharge status: Home / Self Care | Payer: PRIVATE HEALTH INSURANCE | Source: Ambulatory Visit | Attending: Nurse Practitioner | Admitting: Nurse Practitioner

## 2021-07-24 VITALS — BP 126/74 | HR 59 | Temp 97.8°F | Resp 20

## 2021-07-24 DIAGNOSIS — G4486 Cervicogenic headache: Secondary | ICD-10-CM | POA: Diagnosis not present

## 2021-07-24 MED ORDER — IBUPROFEN 800 MG PO TABS: 800.0000 mg | ORAL_TABLET | Freq: Three times a day (TID) | ORAL | 0 refills | Status: DC

## 2021-07-24 MED ORDER — DEXAMETHASONE SODIUM PHOSPHATE 10 MG/ML IJ SOLN
10.0000 mg | Freq: Once | INTRAMUSCULAR | Status: AC
  Administered 2021-07-24: 10 mg via INTRAMUSCULAR

## 2021-07-24 MED ORDER — KETOROLAC TROMETHAMINE 30 MG/ML IJ SOLN
30.0000 mg | Freq: Once | INTRAMUSCULAR | Status: AC
  Administered 2021-07-24: 30 mg via INTRAMUSCULAR

## 2021-07-24 NOTE — ED Provider Notes (Signed)
[facility]    CSN: [id number] Arrival date & time: 07/24/21  1353      History   Chief Complaint Chief Complaint  Patient presents with   Headache    Entered by patient    HPI Connor Sanford is a 66 y.o. male.   HPI Patient presents with left-sided neck pain and headache for the past 2 weeks.  Patient states that the headache is located in the back of his head.  Patient denies injury or trauma to the neck.  Patient states he is unsure of the main cause of the headache.  States he has had a headache in the past, but it normally last only 2 or 3 days.  Patient also is unsure if the headache is caused by stress as he states his father passed away 1 month ago, but feels that is behind him at this time.  Headache has been more persistent over the past 2 to 3 days.  Patient states he has been unable to sleep due to the headache pain.  Patient denies fever, chills, dizziness, lightheadedness, blurred vision, photophobia, phonophobia, abdominal pain, nausea, vomiting, unsteadiness, or weakness.  Patient states that he did lose his hearing in the right ear on the left side, which concerned him because the pain in his neck is also located on the left side.  Patient states he has been taking ibuprofen, Tylenol, and BC powder for his symptoms without good relief.  Patient also states that he has been going to the chiropractor since his headache started.  Reports it appears that his headache symptoms have worsened since seeing the chiropractor.  Past Medical History:  Diagnosis Date   Anxiety    Coronary artery disease    H/O seasonal allergies    Hyperlipidemia    Hypertension    MI (myocardial infarction) ([place]) 03/01/2008    Patient Active Problem List   Diagnosis Date Noted   Hyperlipidemia 03/04/2021   Pain in right testicle 03/04/2021   Viral respiratory infection 03/04/2021   Dog bite 03/04/2021   CAD (coronary artery disease) 01/03/2020   Hx of repair of left  rotator cuff 12/27/2013    Past Surgical History:  Procedure Laterality Date   CARDIAC CATHETERIZATION  01/27/2009   Occluded second diagonal branch-stented with a 2.25x76m Integrity Medtronic bare-metal stent at 12 atm (2.38-mm) resulting in reduction of 100% stenosis to 0% residual. Small fairly focal stenosis just distalof stented segment-stented with a 2.25x850mIntegrity Medtronic bare-metal stent at 12 atm, reduced to 0% residual with TIMI3 flow.   CARDIOVASCULAR STRESS TEST  04/16/2009   Mild perfusion defect seen in Mid Anterior, Apical Anterior, and Apical regions - consistent with infarct/scar. No scintigraphic evidence of inducible myocardial ischemia. EKG negative for ischemia. No ECG changes.   COLONOSCOPY N/A 12/27/2012   Procedure: COLONOSCOPY;  Surgeon: NaRogene HoustonMD;  [facility]: [facility];  Service: Endoscopy;  Laterality: N/A;  730   CORONARY ANGIOPLASTY     2 stents   CORONARY STENT INTERVENTION  01/03/2020   Procedure: CORONARY STENT INTERVENTION;  Surgeon: BeLorretta HarpMD;  [facility];  Service: Cardiovascular;;   LEFT HEART CATH AND CORONARY ANGIOGRAPHY N/A 01/03/2020   Procedure: LEFT HEART CATH AND CORONARY ANGIOGRAPHY;  Surgeon: BeLorretta HarpMD;  [facility];  Service: Cardiovascular;  Laterality: N/A;   RADIOLOGY WITH ANESTHESIA N/A 10/30/2020   Procedure: MRI BRAIN WITH AND WITHOUT CONTRAST;  Surgeon: Radiologist, Medication, MD;  Location: [facility];  Service: Radiology;  Laterality: N/A;   RESECTION DISTAL CLAVICAL Left 06/13/2013   Procedure: RESECTION DISTAL CLAVICAL;  Surgeon: [name], MD;  Location: [facility];  Service: Orthopedics;  Laterality: Left;   SHOULDER OPEN ROTATOR CUFF REPAIR Left 06/13/2013   Procedure: ROTATOR CUFF REPAIR SHOULDER OPEN;  Surgeon: [name], MD;  Location: [facility];  Service: Orthopedics;  Laterality: Left;   TRANSTHORACIC ECHOCARDIOGRAM  04/16/2009   EF >55%, no  significant valvular abnormalities.       Home Medications    Prior to Admission medications   Medication Sig Start Date End Date Taking? Authorizing Provider  ibuprofen (ADVIL) 800 MG tablet Take 1 tablet (800 mg total) by mouth 3 (three) times daily. 07/24/21  Yes Ulys Favia-Warren, Alda Lea, NP  ALPRAZolam Duanne Moron) 0.5 MG tablet Take 1 tablet (0.5 mg total) by mouth at bedtime as needed for anxiety or sleep. 03/04/21   Coral Spikes, DO  aspirin EC 81 MG tablet Take 81 mg by mouth every evening.    [provider]  atorvastatin (LIPITOR) 80 MG tablet Take 1 tablet (80 mg total) by mouth daily. TAKE ONE TABLET BY MOUTH DAILY 04/07/21   [name], MD  ciprofloxacin (CILOXAN) 0.3 % ophthalmic solution Place 1 drop into both eyes every 4 (four) hours while awake. Administer 1 drop, every 2 hours, while awake, for 2 days. Then 1 drop, every 4 hours, while awake, for the next 5 days. 03/26/21   Ameduite, Trenton Gammon, NP  clopidogrel (PLAVIX) 75 MG tablet TAKE ONE (1) TABLET BY MOUTH EVERY DAY 04/30/21   [name], MD  Coenzyme Q10 (COQ10) 100 MG CAPS Take 100 mg by mouth daily.    [provider]  fexofenadine (ALLEGRA) 180 MG tablet Take 180 mg by mouth daily as needed for allergies or rhinitis.    [provider]  fluticasone (FLONASE) 50 MCG/ACT nasal spray 2 sprays each nare daily Patient taking differently: Place 2 sprays into both nostrils daily as needed for allergies. 07/30/20   Kathyrn Drown, MD  Menthol, Topical Analgesic, (BIOFREEZE) 4 % GEL Apply 1 application topically daily as needed (pain).    [provider]  metoprolol tartrate (LOPRESSOR) 25 MG tablet Take 0.5 tablets (12.5 mg total) by mouth 2 (two) times daily. 03/31/21   [name], MD  Multiple Vitamins-Minerals (MULTIVITAMIN WITH IRON-MINERALS) liquid Take 30 mLs by mouth daily.    [provider]  nitroGLYCERIN (NITROSTAT) 0.4 MG SL tablet PLACE ONE TABLET (0.4MG TOTAL)  UNDER THETONGUE EVERY FIVE MINUTES AS NEEDED FOR CHEST PAIN 07/06/21   Coral Spikes, DO    Family History Family History  Problem Relation Age of Onset   Heart disease Father    Heart disease Paternal Grandmother    Colon cancer Neg Hx     Social History Social History   Tobacco Use   Smoking status: Never   Smokeless tobacco: Never  Vaping Use   Vaping Use: Never used  Substance Use Topics   Alcohol use: No   Drug use: No     Allergies   Patient has no known allergies.   Review of Systems Review of Systems PER HPI  Physical Exam Triage Vital Signs ED Triage Vitals  Enc Vitals Group     BP 07/24/21 1427 126/74     Pulse Rate 07/24/21 1427 (!) 59     Resp 07/24/21 1427 20     Temp  07/24/21 1427 97.8 F (36.6 C)     Temp src --      SpO2 07/24/21 1427 98 %     Weight --      Height --      Head Circumference --      Peak Flow --      Pain Score 07/24/21 1428 8     Pain Loc --      Pain Edu? --      Excl. in [place]? --    No data found.  Updated Vital Signs BP 126/74   Pulse (!) 59   Temp 97.8 F (36.6 C)   Resp 20   SpO2 98%   Visual Acuity Right Eye Distance:   Left Eye Distance:   Bilateral Distance:    Right Eye Near:   Left Eye Near:    Bilateral Near:     Physical Exam Vitals and nursing note reviewed.  Constitutional:      General: He is not in acute distress.    Appearance: He is well-developed.  HENT:     Head: Normocephalic.  Eyes:     Extraocular Movements: Extraocular movements intact.     Pupils: Pupils are equal, round, and reactive to light. Pupils are equal.  Neck:     Comments: Tenderness along the left oblique capitis inferior muscle Cardiovascular:     Rate and Rhythm: Regular rhythm.     Heart sounds: Normal heart sounds.  Pulmonary:     Effort: Pulmonary effort is normal.     Breath sounds: Normal breath sounds.  Abdominal:     General: Bowel sounds are normal.     Palpations: Abdomen is soft.     Tenderness:  There is no abdominal tenderness.  Musculoskeletal:     Cervical back: Normal range of motion. No edema or rigidity. Pain with movement and muscular tenderness present. Normal range of motion.  Skin:    General: Skin is dry.  Neurological:     Mental Status: He is alert and oriented to person, place, and time.     GCS: GCS eye subscore is 4. GCS verbal subscore is 5. GCS motor subscore is 6.     Cranial Nerves: Cranial nerves 2-12 are intact.     Motor: Motor function is intact.     Coordination: Coordination is intact.     Comments: Headache pain present in the left occiput.   Psychiatric:        Mood and Affect: Mood normal.        Speech: Speech normal.        Behavior: Behavior normal.     UC Treatments / Results  Labs (all labs ordered are listed, but only abnormal results are displayed) Labs Reviewed - No data to display  EKG   Radiology No results found.  Procedures Procedures (including critical care time)  Medications Ordered in UC Medications  ketorolac (TORADOL) 30 MG/ML injection 30 mg (has no administration in time range)  dexamethasone (DECADRON) injection 10 mg (has no administration in time range)    Initial Impression / Assessment and Plan / UC Course  I have reviewed the triage vital signs and the nursing notes.  Pertinent labs & imaging results that were available during my care of the patient were reviewed by me and considered in my medical decision making (see chart for details).  Patient presents with complaints of headache and neck pain that been present for the past 2 weeks.  Patient's exam  is reassuring, he does not have any neurological deficits, and he denies light or sound sensitivity.  Patient's vital signs are stable, he is in no acute distress.  It appears headache symptoms are related to his neck pain.  Cannot determine the direct cause of his neck pain, but based on his exam, symptoms appear to be coming from this area.  Patient was given  symptomatic treatment in the office today with Toradol and Decadron.  Patient was prescribed ibuprofen for continued symptomatic treatment.  Would like for the patient to follow-up with his primary care physician within the next week for further evaluation.  Patient was given strict return precautions of when to go to the ER.  Follow-up as needed. Final Clinical Impressions(s) / UC Diagnoses   Final diagnoses:  Cervicogenic headache     Discharge Instructions      Take medication as prescribed. Increase fluids and allow for plenty of rest. Recommend using ice or heat to the neck as needed.  Apply ice for pain or swelling, heat for spasm or stiffness.  Apply for 20 minutes, remove for 1 hour, then repeat. Gentle range of motion to help keep good mobility in the neck. Try doing things that you enjoy to help relieve your stress. I would like for you to follow-up with your primary care physician within the next week for further evaluation if your symptoms do not improve. Follow-up in the ER if you develop slurred speech, change in vision, loss of vision, change in your mental status, weakness, or other concerns.     ED Prescriptions     Medication Sig Dispense Auth. Provider   ibuprofen (ADVIL) 800 MG tablet Take 1 tablet (800 mg total) by mouth 3 (three) times daily. 30 tablet Shawndra Clute-Warren, Alda Lea, NP      PDMP not reviewed this encounter.   Tish Men, NP 07/24/21 [phone]

## 2021-07-24 NOTE — ED Triage Notes (Signed)
Pt presents with upper neck pain and headache for past few weeks

## 2021-07-24 NOTE — Discharge Instructions (Signed)
Take medication as prescribed. Increase fluids and allow for plenty of rest. Recommend using ice or heat to the neck as needed.  Apply ice for pain or swelling, heat for spasm or stiffness.  Apply for 20 minutes, remove for 1 hour, then repeat. Gentle range of motion to help keep good mobility in the neck. Try doing things that you enjoy to help relieve your stress. I would like for you to follow-up with your primary care physician within the next week for further evaluation if your symptoms do not improve. Follow-up in the ER if you develop slurred speech, change in vision, loss of vision, change in your mental status, weakness, or other concerns.

## 2021-07-28 ENCOUNTER — Encounter: Payer: Self-pay | Admitting: Family Medicine

## 2021-07-28 ENCOUNTER — Ambulatory Visit (INDEPENDENT_AMBULATORY_CARE_PROVIDER_SITE_OTHER): Payer: PRIVATE HEALTH INSURANCE | Admitting: Family Medicine

## 2021-07-28 DIAGNOSIS — F419 Anxiety disorder, unspecified: Secondary | ICD-10-CM | POA: Insufficient documentation

## 2021-07-28 DIAGNOSIS — R519 Headache, unspecified: Secondary | ICD-10-CM | POA: Diagnosis not present

## 2021-07-28 MED ORDER — ALPRAZOLAM 0.5 MG PO TABS: 0.5000 mg | ORAL_TABLET | Freq: Every evening | ORAL | 0 refills | Status: DC | PRN

## 2021-07-28 MED ORDER — MELOXICAM 15 MG PO TABS: 15.0000 mg | ORAL_TABLET | Freq: Every day | ORAL | 0 refills | Status: DC | PRN

## 2021-07-28 MED ORDER — BACLOFEN 10 MG PO TABS: 10.0000 mg | ORAL_TABLET | Freq: Three times a day (TID) | ORAL | 0 refills | Status: DC | PRN

## 2021-07-28 NOTE — Patient Instructions (Signed)
Medication as prescribed.  If this continues to persist, please let me know.  Take care  Dr. Lacinda Axon

## 2021-07-28 NOTE — Progress Notes (Signed)
Subjective:  Patient ID: Connor Sanford, male    DOB: 1955-11-03  Age: 66 y.o. MRN: [id number]  CC: Chief Complaint  Patient presents with   Headache    Headache in back of head; last Wednesday became worse. Pt went to Urgent Care 07/24/21. Pt states he was having issues with sleeping but is able to sleep ok with Xanax. No blurred vision, dizziness or chest pain. Father past away about a month ago, ear problems last fall.     HPI:  66 year old male with coronary disease, hyperlipidemia, anxiety presents for evaluation of the above.  Patient reports ongoing posterior/occipital headache tickly on the left side.  Worse as of last Wednesday.  He describes the pain as a dull ache.  He was seen in urgent care and was given Toradol as well as dexamethasone.  He has had improvement but has not had complete resolution.  Seems to be exacerbated by activity.  He believes that this stress may be playing a role as he recently lost his father.  No reports of numbness or paresthesia.  No nausea vomiting.  No other associated symptoms.  No other complaints.  Patient Active Problem List   Diagnosis Date Noted   Occipital headache 07/28/2021   Anxiety 07/28/2021   Hyperlipidemia 03/04/2021   CAD (coronary artery disease) 01/03/2020   Hx of repair of left rotator cuff 12/27/2013    Social Hx   Social History   Socioeconomic History   Marital status: Married    Spouse name: Not on file   Number of children: Not on file   Years of education: Not on file   Highest education level: Not on file  Occupational History   Not on file  Tobacco Use   Smoking status: Never   Smokeless tobacco: Never  Vaping Use   Vaping Use: Never used  Substance and Sexual Activity   Alcohol use: No   Drug use: No   Sexual activity: Yes    Birth control/protection: None  Other Topics Concern   Not on file  Social History Narrative   Not on file   Social Determinants of Health   Financial Resource Strain:  Not on file  Food Insecurity: Not on file  Transportation Needs: Not on file  Physical Activity: Not on file  Stress: Not on file  Social Connections: Not on file    Review of Systems Per HPI  Objective:  BP 132/82   Pulse (!) 50   Temp 97.6 F (36.4 C)   Wt 232 lb 9.6 oz (105.5 kg)   SpO2 98%   BMI 33.37 kg/m      07/28/2021   11:26 AM 07/24/2021    2:27 PM 03/26/2021    3:28 PM  BP/Weight  Systolic BP 517 001 749  Diastolic BP 82 74 76  Wt. (Lbs) 232.6  225  BMI 33.37 kg/m2  32.28 kg/m2    Physical Exam Vitals and nursing note reviewed.  Constitutional:      General: He is not in acute distress.    Appearance: Normal appearance. He is well-developed.  HENT:     Head: Normocephalic and atraumatic.  Eyes:     General:        Right eye: No discharge.        Left eye: No discharge.     Conjunctiva/sclera: Conjunctivae normal.  Cardiovascular:     Rate and Rhythm: Normal rate and regular rhythm.  Pulmonary:     Effort: Pulmonary effort  is normal.     Breath sounds: Normal breath sounds. No wheezing, rhonchi or rales.  Musculoskeletal:     Cervical back: Neck supple.  Neurological:     Mental Status: He is alert.  Psychiatric:        Mood and Affect: Mood normal.        Behavior: Behavior normal.    Assessment & Plan:   Problem List Items Addressed This Visit       Other   Occipital headache    Treating with meloxicam and baclofen.  If continues to persist will refer to neurology.       Relevant Medications   baclofen (LIORESAL) 10 MG tablet   meloxicam (MOBIC) 15 MG tablet   Anxiety    Stable. Xanax refilled.       Relevant Medications   ALPRAZolam (XANAX) 0.5 MG tablet    Meds ordered this encounter  Medications   baclofen (LIORESAL) 10 MG tablet    Sig: Take 1 tablet (10 mg total) by mouth 3 (three) times daily as needed for muscle spasms.    Dispense:  30 each    Refill:  0   meloxicam (MOBIC) 15 MG tablet    Sig: Take 1 tablet (15  mg total) by mouth daily as needed for pain.    Dispense:  14 tablet    Refill:  0   ALPRAZolam (XANAX) 0.5 MG tablet    Sig: Take 1 tablet (0.5 mg total) by mouth at bedtime as needed for anxiety or sleep.    Dispense:  30 tablet    Refill:  0    Follow-up: Physical later this year.  [place]

## 2021-07-28 NOTE — Assessment & Plan Note (Signed)
Treating with meloxicam and baclofen.  If continues to persist will refer to neurology.

## 2021-07-28 NOTE — Assessment & Plan Note (Signed)
Stable. Xanax refilled.  

## 2021-08-13 ENCOUNTER — Ambulatory Visit (INDEPENDENT_AMBULATORY_CARE_PROVIDER_SITE_OTHER): Payer: PRIVATE HEALTH INSURANCE | Admitting: Family Medicine

## 2021-08-13 VITALS — BP 124/78 | HR 57 | Temp 98.0°F | Ht 70.0 in | Wt 222.2 lb

## 2021-08-13 DIAGNOSIS — Z23 Encounter for immunization: Secondary | ICD-10-CM

## 2021-08-13 DIAGNOSIS — E785 Hyperlipidemia, unspecified: Secondary | ICD-10-CM | POA: Diagnosis not present

## 2021-08-13 DIAGNOSIS — Z130 Encounter for screening for diseases of the blood and blood-forming organs and certain disorders involving the immune mechanism: Secondary | ICD-10-CM | POA: Diagnosis not present

## 2021-08-13 DIAGNOSIS — Z0000 Encounter for general adult medical examination without abnormal findings: Secondary | ICD-10-CM | POA: Diagnosis not present

## 2021-08-13 DIAGNOSIS — I2510 Atherosclerotic heart disease of native coronary artery without angina pectoris: Secondary | ICD-10-CM

## 2021-08-13 DIAGNOSIS — Z1211 Encounter for screening for malignant neoplasm of colon: Secondary | ICD-10-CM

## 2021-08-13 DIAGNOSIS — Z125 Encounter for screening for malignant neoplasm of prostate: Secondary | ICD-10-CM

## 2021-08-13 NOTE — Patient Instructions (Addendum)
Referral placed to GI.  Continue your meds.  Labs today.  Tdap given today.  Follow up in 6 months

## 2021-08-14 ENCOUNTER — Other Ambulatory Visit: Payer: Self-pay | Admitting: *Deleted

## 2021-08-14 DIAGNOSIS — R739 Hyperglycemia, unspecified: Secondary | ICD-10-CM

## 2021-08-14 DIAGNOSIS — Z0000 Encounter for general adult medical examination without abnormal findings: Secondary | ICD-10-CM | POA: Insufficient documentation

## 2021-08-14 LAB — LIPID PANEL
Chol/HDL Ratio: 3.1 ratio (ref 0.0–5.0)
Cholesterol, Total: 114 mg/dL (ref 100–199)
HDL: 37 mg/dL — ABNORMAL LOW (ref >39)
LDL Chol Calc (NIH): 61 mg/dL (ref 0–99)
Triglycerides: 79 mg/dL (ref 0–149)
VLDL Cholesterol Cal: 16 mg/dL (ref 5–40)

## 2021-08-14 LAB — CBC
Hematocrit: 45.7 % (ref 37.5–51.0)
Hemoglobin: 16.5 g/dL (ref 13.0–17.7)
MCH: 34 pg — ABNORMAL HIGH (ref 26.6–33.0)
MCHC: 36.1 g/dL — ABNORMAL HIGH (ref 31.5–35.7)
MCV: 94 fL (ref 79–97)
Platelets: 191 10*3/uL (ref 150–450)
RBC: 4.86 x10E6/uL (ref 4.14–5.80)
RDW: 12.6 % (ref 11.6–15.4)
WBC: 6.2 10*3/uL (ref 3.4–10.8)

## 2021-08-14 LAB — CMP14+EGFR
ALT: 34 IU/L (ref 0–44)
AST: 22 IU/L (ref 0–40)
Albumin/Globulin Ratio: 2.3 — ABNORMAL HIGH (ref 1.2–2.2)
Albumin: 4.9 g/dL — ABNORMAL HIGH (ref 3.8–4.8)
Alkaline Phosphatase: 111 IU/L (ref 44–121)
BUN/Creatinine Ratio: 21 (ref 10–24)
BUN: 20 mg/dL (ref 8–27)
Bilirubin Total: 1.1 mg/dL (ref 0.0–1.2)
CO2: 21 mmol/L (ref 20–29)
Calcium: 9.5 mg/dL (ref 8.6–10.2)
Chloride: 101 mmol/L (ref 96–106)
Creatinine, Ser: 0.95 mg/dL (ref 0.76–1.27)
Globulin, Total: 2.1 g/dL (ref 1.5–4.5)
Glucose: 121 mg/dL — ABNORMAL HIGH (ref 70–99)
Potassium: 4.5 mmol/L (ref 3.5–5.2)
Sodium: 141 mmol/L (ref 134–144)
Total Protein: 7 g/dL (ref 6.0–8.5)
eGFR: 89 mL/min/{1.73_m2} (ref >59)

## 2021-08-14 LAB — PSA: Prostate Specific Ag, Serum: 0.6 ng/mL (ref 0.0–4.0)

## 2021-08-14 NOTE — Progress Notes (Signed)
Subjective:  Patient ID: Connor Sanford, male    DOB: January 31, 1956  Age: 66 y.o. MRN: [id number]  CC: Chief Complaint  Patient presents with   wellness exam    No problems or concerns    HPI:  66 year old male with coronary artery disease, anxiety, hyperlipidemia presents for an annual physical exam.  Patient states that he is doing well.  Denies chest pain or shortness of breath.   Preventative Healthcare Colonoscopy: Needs repeat colonoscopy. Will place referral.  Immunizations Tetanus - Needs Tdap today.  Pneumococcal - Planning for pneumococcal vaccine when he turns 71. Shingles - Completed. Prostate cancer screening: PSA today.  Hepatitis C screening - Declines. Labs: Needs labs today.  Exercise: Active - plays cornhole, yardwork. Alcohol use: No.  Smoking/tobacco use: No. STD/HIV testing: Declines.   Patient Active Problem List   Diagnosis Date Noted   Annual physical exam 08/14/2021   Occipital headache 07/28/2021   Anxiety 07/28/2021   Hyperlipidemia 03/04/2021   CAD (coronary artery disease) 01/03/2020   Hx of repair of left rotator cuff 12/27/2013    Social Hx   Social History   Socioeconomic History   Marital status: Married    Spouse name: Not on file   Number of children: Not on file   Years of education: Not on file   Highest education level: Not on file  Occupational History   Not on file  Tobacco Use   Smoking status: Never   Smokeless tobacco: Never  Vaping Use   Vaping Use: Never used  Substance and Sexual Activity   Alcohol use: No   Drug use: No   Sexual activity: Yes    Birth control/protection: None  Other Topics Concern   Not on file  Social History Narrative   Not on file   Social Determinants of Health   Financial Resource Strain: Not on file  Food Insecurity: Not on file  Transportation Needs: Not on file  Physical Activity: Not on file  Stress: Not on file  Social Connections: Not on file    Review of  Systems Per HPI  Objective:  BP 124/78   Pulse (!) 57   Temp 98 F (36.7 C) (Oral)   Ht _0  (1.778 m)   Wt 222 lb 3.2 oz (100.8 kg)   SpO2 97%   BMI 31.88 kg/m      08/13/2021    9:06 AM 07/28/2021   11:26 AM 07/24/2021    2:27 PM  BP/Weight  Systolic BP 324 401 027  Diastolic BP 78 82 74  Wt. (Lbs) 222.2 232.6   BMI 31.88 kg/m2 33.37 kg/m2     Physical Exam Vitals and nursing note reviewed.  Constitutional:      General: He is not in acute distress.    Appearance: Normal appearance. He is not ill-appearing.  HENT:     Head: Normocephalic and atraumatic.     Mouth/Throat:     Pharynx: Oropharynx is clear.  Eyes:     General:        Right eye: No discharge.        Left eye: No discharge.     Conjunctiva/sclera: Conjunctivae normal.  Cardiovascular:     Rate and Rhythm: Normal rate and regular rhythm.     Heart sounds: No murmur heard. Pulmonary:     Effort: Pulmonary effort is normal.     Breath sounds: Normal breath sounds. No wheezing, rhonchi or rales.  Abdominal:     General:  There is no distension.     Palpations: Abdomen is soft. There is no mass.     Tenderness: There is no abdominal tenderness.  Musculoskeletal:     Comments: Patient has a large seborrheic keratosis at the left temporal region.  Neurological:     Mental Status: He is alert.  Psychiatric:        Mood and Affect: Mood normal.        Behavior: Behavior normal.    Assessment & Plan:   Problem List Items Addressed This Visit       Cardiovascular and Mediastinum   CAD (coronary artery disease)   Relevant Orders   CMP14+EGFR (Completed)     Other   Annual physical exam - Primary    Tdap given today. Labs today. Declines HIV and hepatitis C screening. Planning for pneumococcal vaccine at age 66. Patient is doing well at this time.      Hyperlipidemia   Relevant Orders   Lipid panel (Completed)   Other Visit Diagnoses     Screening for deficiency anemia       Relevant  Orders   CBC (Completed)   Prostate cancer screening       Relevant Orders   PSA (Completed)   Need for vaccination       Relevant Orders   Tdap vaccine greater than or equal to 7yo IM (Completed)   Colon cancer screening       Relevant Orders   Ambulatory referral to Gastroenterology      Follow-up:  Return in about 6 months (around 02/12/2022).  [place]

## 2021-08-14 NOTE — Assessment & Plan Note (Signed)
Tdap given today. Labs today. Declines HIV and hepatitis C screening. Planning for pneumococcal vaccine at age 66. Patient is doing well at this time.

## 2021-08-18 ENCOUNTER — Encounter (INDEPENDENT_AMBULATORY_CARE_PROVIDER_SITE_OTHER): Payer: Self-pay | Admitting: *Deleted

## 2021-08-19 ENCOUNTER — Other Ambulatory Visit: Payer: Self-pay | Admitting: Family Medicine

## 2021-08-19 ENCOUNTER — Telehealth: Payer: Self-pay

## 2021-08-19 LAB — HEMOGLOBIN A1C
Est. average glucose Bld gHb Est-mCnc: 146 mg/dL
Hgb A1c MFr Bld: 6.7 % — ABNORMAL HIGH (ref 4.8–5.6)

## 2021-08-19 MED ORDER — METFORMIN HCL 500 MG PO TABS: 500.0000 mg | ORAL_TABLET | Freq: Two times a day (BID) | ORAL | 3 refills | Status: DC

## 2021-08-24 ENCOUNTER — Other Ambulatory Visit: Payer: Self-pay | Admitting: Family Medicine

## 2021-09-21 ENCOUNTER — Encounter (INDEPENDENT_AMBULATORY_CARE_PROVIDER_SITE_OTHER): Payer: Self-pay

## 2021-09-21 ENCOUNTER — Telehealth (INDEPENDENT_AMBULATORY_CARE_PROVIDER_SITE_OTHER): Payer: Self-pay

## 2021-09-21 ENCOUNTER — Other Ambulatory Visit (INDEPENDENT_AMBULATORY_CARE_PROVIDER_SITE_OTHER): Payer: Self-pay

## 2021-09-21 ENCOUNTER — Telehealth: Payer: Self-pay | Admitting: *Deleted

## 2021-09-21 DIAGNOSIS — Z86010 Personal history of colonic polyps: Secondary | ICD-10-CM

## 2021-09-21 MED ORDER — PEG 3350-KCL-NA BICARB-NACL 420 G PO SOLR: 4000.0000 mL | ORAL | 0 refills | Status: DC

## 2021-09-21 NOTE — Telephone Encounter (Signed)
[name] [name] [name], CMA  ?

## 2021-09-21 NOTE — Telephone Encounter (Signed)
   Patient Name: Connor Sanford  DOB: 25-Apr-1955 MRN: [id number]  Primary Cardiologist: Quay Burow, MD  Chart reviewed as part of pre-operative protocol coverage.   66 year old male with a history of CAD s/p BMS x2 overlapping-second diagonal in 2010, DES-LCx in 2021, ICM with improved EF, and hyperlipidemia.   He was last seen in the office on 18 2022 by Dr. Gwenlyn Found and was doing well from a cardiac standpoint.  He denied symptoms concerning for angina.  Received surgical clearance request for colonoscopy scheduled for 10/07/2021 with Dr. Maylon Peppers of  GI with request to hold Plavix for 5 days prior to procedure.  Dr. Gwenlyn Found, given patient's history of overlapping stents, please advise on holding Plavix prior to colonoscopy.  Please route your response to P [facility] DIV PREOP.   Thank you.  Lenna Sciara, NP 09/21/2021, 1:31 PM

## 2021-09-21 NOTE — Telephone Encounter (Signed)
   Pre-operative Risk Assessment    Patient Name: Connor Sanford  DOB: 28-Feb-1956 MRN: [id number]      Request for Surgical Clearance    Procedure:   COLONOSCOPY  Date of Surgery:  Clearance 10/07/21                                 Surgeon:  DR. DANIEL CASTANEDA Surgeon's Group or Practice Name:  [facility] Phone number:  [phone] Fax number:  [phone]   Type of Clearance Requested:   - Medical  - Pharmacy:  Hold Clopidogrel (Plavix) x 5 DAYS PRIOR   Type of Anesthesia:  MAC   Additional requests/questions:    Jiles Prows   09/21/2021, 1:00 PM

## 2021-09-21 NOTE — Telephone Encounter (Signed)
Referring MD/PCP: Dr Lacinda Axon  Procedure: Tcs  Reason/Indication:  hx of tubular Adenoma   Has patient had this procedure before?  Yes 10/26/2012  If so, when, by whom and where?    Is there a family history of colon cancer?  No   Who?  What age when diagnosed?    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  no  Do you have a pacemaker/defibrillator?  no  Has patient ever had endocarditis/atrial fibrillation? no  Does patient use oxygen? no  Has patient had joint replacement within last 12 months?  no  Is patient constipated or do they take laxatives? no  Does patient have a history of alcohol/drug use?  no  Have you had a stroke/heart attack last 6 mths? no  Do you take medicine for weight loss?  no  For male patients,: have you had a hysterectomy n/a                      are you post menopausal n/a                      do you still have your menstrual cycle n/a  Is patient on blood thinner such as Coumadin, Plavix and/or Aspirin? Yes   Medications: AsA 81 mg daily, Plavix 75 mg daily, Atorvastatin 80 mg daily, Metoprolol 25 mg daily, Metformin 500 mg bid, alprazolam 0.5 mg prn, contrazone 10 mg prn, flonase prn    Allergies: NKDA   Medication Adjustment per Dr Jenetta Downer HOLD PLAVIX 5 Ayr MET FORMIN THE EVENING PRIOR OR MORNING OF   Procedure date & time: 10/07/21 AT 830

## 2021-09-23 NOTE — Telephone Encounter (Signed)
Ok to hold Plavix 5 days prior to procedure and restart after.  Dr Debara Pickett (for Dr. Gwenlyn Found)

## 2021-09-24 ENCOUNTER — Telehealth: Payer: Self-pay | Admitting: *Deleted

## 2021-09-24 NOTE — Telephone Encounter (Signed)
Pt tele appt 09/30/21 @ 2:40 pm . Med rec and consent are done.

## 2021-09-24 NOTE — Telephone Encounter (Signed)
Pt tele appt 09/30/21 @ 2:40 pm . Med rec and consent are done.     Patient Consent for Virtual Visit        Connor Sanford has provided verbal consent on 09/24/2021 for a virtual visit (video or telephone).   CONSENT FOR VIRTUAL VISIT FOR:  Connor Sanford  By participating in this virtual visit I agree to the following:  I hereby voluntarily request, consent and authorize [facility] and its employed or contracted physicians, physician assistants, nurse practitioners or other licensed health care professionals (the Practitioner), to provide me with telemedicine health care services (the "Services") as deemed necessary by the treating Practitioner. I acknowledge and consent to receive the Services by the Practitioner via telemedicine. I understand that the telemedicine visit will involve communicating with the Practitioner through live audiovisual communication technology and the disclosure of certain medical information by electronic transmission. I acknowledge that I have been given the opportunity to request an in-person assessment or other available alternative prior to the telemedicine visit and am voluntarily participating in the telemedicine visit.  I understand that I have the right to withhold or withdraw my consent to the use of telemedicine in the course of my care at any time, without affecting my right to future care or treatment, and that the Practitioner or I may terminate the telemedicine visit at any time. I understand that I have the right to inspect all information obtained and/or recorded in the course of the telemedicine visit and may receive copies of available information for a reasonable fee.  I understand that some of the potential risks of receiving the Services via telemedicine include:  Delay or interruption in medical evaluation due to technological equipment failure or disruption; Information transmitted may not be sufficient (e.g. poor resolution of images) to  allow for appropriate medical decision making by the Practitioner; and/or  In rare instances, security protocols could fail, causing a breach of personal health information.  Furthermore, I acknowledge that it is my responsibility to provide information about my medical history, conditions and care that is complete and accurate to the best of my ability. I acknowledge that Practitioner's advice, recommendations, and/or decision may be based on factors not within their control, such as incomplete or inaccurate data provided by me or distortions of diagnostic images or specimens that may result from electronic transmissions. I understand that the practice of medicine is not an exact science and that Practitioner makes no warranties or guarantees regarding treatment outcomes. I acknowledge that a copy of this consent can be made available to me via my patient portal ([facility]), or I can request a printed copy by calling the office of [facility].    I understand that my insurance will be billed for this visit.   I have read or had this consent read to me. I understand the contents of this consent, which adequately explains the benefits and risks of the Services being provided via telemedicine.  I have been provided ample opportunity to ask questions regarding this consent and the Services and have had my questions answered to my satisfaction. I give my informed consent for the services to be provided through the use of telemedicine in my medical care

## 2021-09-24 NOTE — Telephone Encounter (Signed)
   Name: Connor Sanford  DOB: [birth year]  MRN: [id number]  Primary Cardiologist: Quay Burow, MD   Preoperative team, please contact this patient and set up a phone call appointment for further preoperative risk assessment. Please obtain consent and complete medication review. Thank you for your help.  I confirm that guidance regarding antiplatelet and oral anticoagulation therapy has been completed and, if necessary, noted below.  Per office protocol, patient may hold Plavix for 5 days prior to procedure.  Please resume Plavix as soon as possible postprocedure, at the discretion of the surgeon.  Lenna Sciara, NP 09/24/2021, 8:42 AM [facility] [address] [place], [place] [address]

## 2021-09-30 ENCOUNTER — Ambulatory Visit (INDEPENDENT_AMBULATORY_CARE_PROVIDER_SITE_OTHER): Payer: Medicare HMO | Admitting: Physician Assistant

## 2021-09-30 DIAGNOSIS — Z01810 Encounter for preprocedural cardiovascular examination: Secondary | ICD-10-CM

## 2021-09-30 NOTE — Progress Notes (Signed)
Virtual Visit via Telephone Note   Because of CABLE FEARN co-morbid illnesses, he is at least at moderate risk for complications without adequate follow up.  This format is felt to be most appropriate for this patient at this time.  The patient did not have access to video technology/had technical difficulties with video requiring transitioning to audio format only (telephone).  All issues noted in this document were discussed and addressed.  No physical exam could be performed with this format.  Please refer to the patient's chart for his consent to telehealth for [facility].  Evaluation Performed:  Preoperative cardiovascular risk assessment _____________   Date:  09/30/2021   Patient ID:  Connor, Sanford Jun 03, 1955, MRN [id number] Patient Location:  Home Provider location:   Office  Primary Care Provider:  Coral Spikes, DO Primary Cardiologist:  Quay Burow, MD  Chief Complaint / Patient Profile   66 y.o. y/o male with a h/o hyperlipidemia and CAD status post bare-metal stents (2 overlapping) 12/2008 who is pending colonoscopy and presents today for telephonic preoperative cardiovascular risk assessment.  Past Medical History    Past Medical History:  Diagnosis Date   Anxiety    Coronary artery disease    H/O seasonal allergies    Hyperlipidemia    Hypertension    MI (myocardial infarction) ([place]) 03/01/2008   Past Surgical History:  Procedure Laterality Date   CARDIAC CATHETERIZATION  01/27/2009   Occluded second diagonal branch-stented with a 2.25x71m Integrity Medtronic bare-metal stent at 12 atm (2.38-mm) resulting in reduction of 100% stenosis to 0% residual. Small fairly focal stenosis just distalof stented segment-stented with a [id number] Medtronic bare-metal stent at 12 atm, reduced to 0% residual with TIMI3 flow.   CARDIOVASCULAR STRESS TEST  04/16/2009   Mild perfusion defect seen in Mid Anterior, Apical Anterior, and Apical regions  - consistent with infarct/scar. No scintigraphic evidence of inducible myocardial ischemia. EKG negative for ischemia. No ECG changes.   COLONOSCOPY N/A 12/27/2012   Procedure: COLONOSCOPY;  Surgeon: NaRogene HoustonMD;  Location: [facility];  Service: Endoscopy;  Laterality: N/A;  730   CORONARY ANGIOPLASTY     2 stents   CORONARY STENT INTERVENTION  01/03/2020   Procedure: CORONARY STENT INTERVENTION;  Surgeon: BeLorretta HarpMD;  [facility];  Service: Cardiovascular;;   LEFT HEART CATH AND CORONARY ANGIOGRAPHY N/A 01/03/2020   Procedure: LEFT HEART CATH AND CORONARY ANGIOGRAPHY;  Surgeon: BeLorretta HarpMD;  [facility];  Service: Cardiovascular;  Laterality: N/A;   RADIOLOGY WITH ANESTHESIA N/A 10/30/2020   Procedure: MRI BRAIN WITH AND WITHOUT CONTRAST;  Surgeon: Radiologist, Medication, MD;  [facility] Service: Radiology;  Laterality: N/A;   RESECTION DISTAL CLAVICAL Left 06/13/2013   Procedure: RESECTION DISTAL CLAVICAL;  Surgeon: StCarole CivilMD;  Location: [facility] ORS;  Service: Orthopedics;  Laterality: Left;   SHOULDER OPEN ROTATOR CUFF REPAIR Left 06/13/2013   Procedure: ROTATOR CUFF REPAIR SHOULDER OPEN;  Surgeon: StCarole CivilMD;  Location: [facility] ORS;  Service: Orthopedics;  Laterality: Left;   TRANSTHORACIC ECHOCARDIOGRAM  04/16/2009   EF >55%, no significant valvular abnormalities.    Allergies  No Known Allergies  History of Present Illness    Connor SCHERZERs a [age].o. male who presents via audio/video conferencing for a telehealth visit today.  Pt was last seen in cardiology clinic on 02/11/2021 by [name] [place] doing well.  The patient is now pending procedure as outlined above. Since his last visit, he No chests pains or SOB. No issues.  He has not had any issues with his medications.  He remains active and does plenty of walking and has no issues going up and down stairs.  He is  a retired Airline pilot.  He does play corn hole recreationally.  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  Because of this he is scored a 6.36 METS on the DASI.  He meets the minimum of 4 METS required.  Patient may hold Plavix for 5 days prior to procedure.  Please resume Plavix as soon as possible postprocedure, at the discretion of the surgeon.   Reports no shortness of breath nor dyspnea on exertion. Reports no chest pain, pressure, or tightness. No edema, orthopnea, PND. Reports no palpitations.    Home Medications    Prior to Admission medications   Medication Sig Start Date End Date Taking? Authorizing Provider  ALPRAZolam Duanne Moron) 0.5 MG tablet Take 1 tablet (0.5 mg total) by mouth at bedtime as needed for anxiety or sleep. 07/28/21   Coral Spikes, DO  aspirin EC 81 MG tablet Take 81 mg by mouth every evening.    [provider]  atorvastatin (LIPITOR) 80 MG tablet Take 1 tablet (80 mg total) by mouth daily. TAKE ONE TABLET BY MOUTH DAILY 04/07/21   [name], MD  Cetirizine HCl (ZYRTEC ALLERGY PO) Take by mouth.    [provider]  clopidogrel (PLAVIX) 75 MG tablet TAKE ONE (1) TABLET BY MOUTH EVERY DAY 04/30/21   [name], MD  Coenzyme Q10 (COQ10) 100 MG CAPS Take 100 mg by mouth daily.    [provider]  fluticasone (FLONASE) 50 MCG/ACT nasal spray 2 sprays each nare daily Patient taking differently: Place 2 sprays into both nostrils daily as needed for allergies. 07/30/20   Kathyrn Drown, MD  meloxicam (MOBIC) 15 MG tablet Take 1 tablet (15 mg total) by mouth daily as needed for pain. 07/28/21   Coral Spikes, DO  Menthol, Topical Analgesic, (BIOFREEZE) 4 % GEL Apply 1 application topically daily as needed (pain).    [provider]  metFORMIN (GLUCOPHAGE) 500 MG tablet Take 1 tablet (500 mg total) by mouth 2 (two) times daily with a meal. 08/19/21   Thersa Salt G, DO  metoprolol tartrate (LOPRESSOR) 25 MG tablet Take 0.5  tablets (12.5 mg total) by mouth 2 (two) times daily. 03/31/21   [name], MD  Multiple Vitamins-Minerals (MULTIVITAMIN WITH IRON-MINERALS) liquid Take 30 mLs by mouth daily.    [provider]  nitroGLYCERIN (NITROSTAT) 0.4 MG SL tablet PLACE ONE TABLET (0.4MG TOTAL) UNDER THETONGUE EVERY FIVE MINUTES AS NEEDED FOR CHEST PAIN 08/24/21   Coral Spikes, DO  polyethylene glycol-electrolytes (TRILYTE) 420 g solution Take 4,000 mLs by mouth as directed. 09/21/21   Harvel Quale, MD    Physical Exam    Vital Signs:  AGUSTIN SWATEK does not have vital signs available for review today.  Given telephonic nature of communication, physical exam is limited. AAOx3. NAD. Normal affect.  Speech and respirations are unlabored.  Accessory Clinical Findings    None  Assessment & Plan    1.  Preoperative Cardiovascular Risk Assessment:  Mr. Wescoat perioperative risk of a major cardiac event is 0.9% according to the Revised Cardiac Risk Index (RCRI).  Therefore, he is  at low risk for perioperative complications.   His functional capacity is good at 6.36 METs according to the [place] Activity Status Index (DASI). Recommendations: According to ACC/AHA guidelines, no further cardiovascular testing needed.  The patient may proceed to surgery at acceptable risk.   Antiplatelet and/or Anticoagulation Recommendations: Clopidogrel (Plavix) can be held for 5 days prior to his surgery and resumed as soon as possible post op.  A copy of this note will be routed to requesting surgeon.  Time:   Today, I have spent 10 minutes with the patient with telehealth technology discussing medical history, symptoms, and management plan.     Elgie Collard, PA-C  09/30/2021, 2:43 PM

## 2021-10-07 ENCOUNTER — Ambulatory Visit (HOSPITAL_COMMUNITY): Payer: Medicare HMO | Admitting: Anesthesiology

## 2021-10-07 ENCOUNTER — Encounter (HOSPITAL_COMMUNITY): Payer: Self-pay | Admitting: Gastroenterology

## 2021-10-07 ENCOUNTER — Encounter (INDEPENDENT_AMBULATORY_CARE_PROVIDER_SITE_OTHER): Payer: Self-pay | Admitting: *Deleted

## 2021-10-07 ENCOUNTER — Ambulatory Visit (HOSPITAL_COMMUNITY)
Admission: RE | Admit: 2021-10-07 | Discharge: 2021-10-07 | Disposition: A | Discharge status: Home / Self Care | Payer: Medicare HMO | Attending: Gastroenterology | Admitting: Gastroenterology

## 2021-10-07 ENCOUNTER — Ambulatory Visit (HOSPITAL_BASED_OUTPATIENT_CLINIC_OR_DEPARTMENT_OTHER): Payer: Medicare HMO | Admitting: Anesthesiology

## 2021-10-07 ENCOUNTER — Other Ambulatory Visit: Payer: Self-pay

## 2021-10-07 ENCOUNTER — Encounter (HOSPITAL_COMMUNITY)
Admission: RE | Disposition: A | Discharge status: Home / Self Care | Payer: Self-pay | Source: Home / Self Care | Attending: Gastroenterology

## 2021-10-07 DIAGNOSIS — D123 Benign neoplasm of transverse colon: Secondary | ICD-10-CM | POA: Diagnosis not present

## 2021-10-07 DIAGNOSIS — K648 Other hemorrhoids: Secondary | ICD-10-CM | POA: Diagnosis not present

## 2021-10-07 DIAGNOSIS — Z8249 Family history of ischemic heart disease and other diseases of the circulatory system: Secondary | ICD-10-CM | POA: Diagnosis not present

## 2021-10-07 DIAGNOSIS — I2510 Atherosclerotic heart disease of native coronary artery without angina pectoris: Secondary | ICD-10-CM | POA: Insufficient documentation

## 2021-10-07 DIAGNOSIS — Z09 Encounter for follow-up examination after completed treatment for conditions other than malignant neoplasm: Secondary | ICD-10-CM | POA: Diagnosis not present

## 2021-10-07 DIAGNOSIS — E785 Hyperlipidemia, unspecified: Secondary | ICD-10-CM | POA: Diagnosis not present

## 2021-10-07 DIAGNOSIS — I10 Essential (primary) hypertension: Secondary | ICD-10-CM | POA: Diagnosis not present

## 2021-10-07 DIAGNOSIS — D122 Benign neoplasm of ascending colon: Secondary | ICD-10-CM | POA: Diagnosis not present

## 2021-10-07 DIAGNOSIS — K635 Polyp of colon: Secondary | ICD-10-CM

## 2021-10-07 DIAGNOSIS — Z955 Presence of coronary angioplasty implant and graft: Secondary | ICD-10-CM | POA: Insufficient documentation

## 2021-10-07 DIAGNOSIS — Z79899 Other long term (current) drug therapy: Secondary | ICD-10-CM | POA: Insufficient documentation

## 2021-10-07 DIAGNOSIS — Z86010 Personal history of colonic polyps: Secondary | ICD-10-CM | POA: Diagnosis not present

## 2021-10-07 DIAGNOSIS — I252 Old myocardial infarction: Secondary | ICD-10-CM | POA: Insufficient documentation

## 2021-10-07 DIAGNOSIS — F419 Anxiety disorder, unspecified: Secondary | ICD-10-CM | POA: Diagnosis not present

## 2021-10-07 DIAGNOSIS — Z1211 Encounter for screening for malignant neoplasm of colon: Secondary | ICD-10-CM | POA: Diagnosis not present

## 2021-10-07 DIAGNOSIS — D126 Benign neoplasm of colon, unspecified: Secondary | ICD-10-CM

## 2021-10-07 HISTORY — PX: COLONOSCOPY WITH PROPOFOL: SHX5780

## 2021-10-07 HISTORY — PX: POLYPECTOMY: SHX149

## 2021-10-07 LAB — GLUCOSE, CAPILLARY: Glucose-Capillary: 101 mg/dL — ABNORMAL HIGH (ref 70–99)

## 2021-10-07 LAB — HM COLONOSCOPY

## 2021-10-07 SURGERY — COLONOSCOPY WITH PROPOFOL
Anesthesia: General

## 2021-10-07 MED ORDER — LIDOCAINE HCL (CARDIAC) PF 100 MG/5ML IV SOSY
PREFILLED_SYRINGE | INTRAVENOUS | Status: DC | PRN
  Administered 2021-10-07: 50 mg via INTRATRACHEAL

## 2021-10-07 MED ORDER — PROPOFOL 10 MG/ML IV BOLUS
INTRAVENOUS | Status: DC | PRN
  Administered 2021-10-07: 110 mg via INTRAVENOUS

## 2021-10-07 MED ORDER — PROPOFOL 500 MG/50ML IV EMUL
INTRAVENOUS | Status: DC | PRN
  Administered 2021-10-07: 200 ug/kg/min via INTRAVENOUS

## 2021-10-07 MED ORDER — LACTATED RINGERS IV SOLN
INTRAVENOUS | Status: DC
  Administered 2021-10-07: 1000 mL via INTRAVENOUS

## 2021-10-07 NOTE — Anesthesia Postprocedure Evaluation (Signed)
Anesthesia Post Note  Patient: Connor Sanford  Procedure(s) Performed: COLONOSCOPY WITH PROPOFOL POLYPECTOMY INTESTINAL  Patient location during evaluation: Phase II Anesthesia Type: General Level of consciousness: awake and alert and oriented Pain management: pain level controlled Vital Signs Assessment: post-procedure vital signs reviewed and stable Respiratory status: spontaneous breathing, nonlabored ventilation and respiratory function stable Cardiovascular status: blood pressure returned to baseline and stable Postop Assessment: no apparent nausea or vomiting Anesthetic complications: no   No notable events documented.   Last Vitals:  Vitals:   10/07/21 0726 10/07/21 0923  BP: 133/71 (!) 91/44  Pulse: 61 (!) 57  Resp: 14 11  Temp: 36.7 C 36.5 C  SpO2: 96% 95%    Last Pain:  Vitals:   10/07/21 0923  TempSrc: Oral  PainSc: 0-No pain                 Huyen Perazzo C Jode Lippe

## 2021-10-07 NOTE — Transfer of Care (Signed)
Immediate Anesthesia Transfer of Care Note  Patient: Connor Sanford  Procedure(s) Performed: COLONOSCOPY WITH PROPOFOL POLYPECTOMY INTESTINAL  Patient Location: Endoscopy Unit  Anesthesia Type:MAC  Level of Consciousness: sedated and patient cooperative  Airway & Oxygen Therapy: Patient Spontanous Breathing and Patient connected to nasal cannula oxygen  Post-op Assessment: Report given to RN, Post -op Vital signs reviewed and stable and Patient moving all extremities  Post vital signs: Reviewed and stable  Last Vitals:  Vitals Value Taken Time  BP 91/44 10/07/21 0923  Temp 36.5 C 10/07/21 0923  Pulse 57 10/07/21 0923  Resp 11 10/07/21 0923  SpO2 95 % 10/07/21 0923    Last Pain:  Vitals:   10/07/21 0923  TempSrc: Oral  PainSc:       Patients Stated Pain Goal: 6 (28/11/88 6773)  Complications: No notable events documented.

## 2021-10-07 NOTE — H&P (Signed)
Connor Sanford is an 66 y.o. male.   Chief Complaint: History of colonic polyps HPI: 66 year old male with past medical history of anxiety, coronary artery disease on Plavix, hypertension, hyperlipidemia, coming for history of colonic polyps.   The patient denies having any complaints such as melena, hematochezia, abdominal pain or distention, change in her bowel movement consistency or frequency, no changes in his weight recently.  No family history of colorectal cancer.  Last colonoscopy was performed in 2014, was found to have 1 adenomatous polyp in the colon.  Past Medical History:  Diagnosis Date   Anxiety    Coronary artery disease    H/O seasonal allergies    Hyperlipidemia    Hypertension    MI (myocardial infarction) ([place]) 03/01/2008    Past Surgical History:  Procedure Laterality Date   CARDIAC CATHETERIZATION  01/27/2009   Occluded second diagonal branch-stented with a 2.25x1m Integrity Medtronic bare-metal stent at 12 atm (2.38-mm) resulting in reduction of 100% stenosis to 0% residual. Small fairly focal stenosis just distalof stented segment-stented with a 2.25x841mIntegrity Medtronic bare-metal stent at 12 atm, reduced to 0% residual with TIMI3 flow.   CARDIOVASCULAR STRESS TEST  04/16/2009   Mild perfusion defect seen in Mid Anterior, Apical Anterior, and Apical regions - consistent with infarct/scar. No scintigraphic evidence of inducible myocardial ischemia. EKG negative for ischemia. No ECG changes.   COLONOSCOPY N/A 12/27/2012   Procedure: COLONOSCOPY;  Surgeon: NaRogene HoustonMD;  [facility]: [facility];  Service: Endoscopy;  Laterality: N/A;  730   CORONARY ANGIOPLASTY     2 stents   CORONARY STENT INTERVENTION  01/03/2020   Procedure: CORONARY STENT INTERVENTION;  Surgeon: BeLorretta HarpMD;  [facility];  Service: Cardiovascular;;   LEFT HEART CATH AND CORONARY ANGIOGRAPHY N/A 01/03/2020   Procedure: LEFT HEART CATH AND CORONARY  ANGIOGRAPHY;  Surgeon: BeLorretta HarpMD;  [facility];  Service: Cardiovascular;  Laterality: N/A;   RADIOLOGY WITH ANESTHESIA N/A 10/30/2020   Procedure: MRI BRAIN WITH AND WITHOUT CONTRAST;  Surgeon: Radiologist, Medication, MD;  [facility] Service: Radiology;  Laterality: N/A;   RESECTION DISTAL CLAVICAL Left 06/13/2013   Procedure: RESECTION DISTAL CLAVICAL;  Surgeon: StCarole CivilMD;  Location: [facility];  Service: Orthopedics;  Laterality: Left;   SHOULDER OPEN ROTATOR CUFF REPAIR Left 06/13/2013   Procedure: ROTATOR CUFF REPAIR SHOULDER OPEN;  Surgeon: StCarole CivilMD;  Location: [facility];  Service: Orthopedics;  Laterality: Left;   TRANSTHORACIC ECHOCARDIOGRAM  04/16/2009   EF >55%, no significant valvular abnormalities.    Family History  Problem Relation Age of Onset   Heart disease Father    Heart disease Paternal Grandmother    Colon cancer Neg Hx    Social History:  reports that he has never smoked. He has never used smokeless tobacco. He reports that he does not drink alcohol and does not use drugs.  Allergies: No Known Allergies  Medications Prior to Admission  Medication Sig Dispense Refill   ALPRAZolam (XANAX) 0.5 MG tablet Take 1 tablet (0.5 mg total) by mouth at bedtime as needed for anxiety or sleep. 30 tablet 0   aspirin EC 81 MG tablet Take 81 mg by mouth every evening.     atorvastatin (LIPITOR) 80 MG tablet Take 1 tablet (80 mg total) by mouth daily. TAKE ONE TABLET BY MOUTH DAILY 30 tablet 6   cetirizine (ZYRTEC) 10 MG tablet Take 10 mg  by mouth daily as needed (allergies).     clopidogrel (PLAVIX) 75 MG tablet TAKE ONE (1) TABLET BY MOUTH EVERY DAY 90 tablet 2   Coenzyme Q10 (COQ10) 100 MG CAPS Take 100 mg by mouth daily.     fluticasone (FLONASE) 50 MCG/ACT nasal spray 2 sprays each nare daily (Patient taking differently: Place 1 spray into both nostrils daily as needed for allergies.) 15.8 mL 3   Menthol, Topical Analgesic,  (BIOFREEZE) 4 % GEL Apply 1 application topically daily as needed (pain).     metFORMIN (GLUCOPHAGE) 500 MG tablet Take 1 tablet (500 mg total) by mouth 2 (two) times daily with a meal. (Patient taking differently: Take 500 mg by mouth every evening.) 180 tablet 3   metoprolol tartrate (LOPRESSOR) 25 MG tablet Take 0.5 tablets (12.5 mg total) by mouth 2 (two) times daily. 90 tablet 3   Multiple Vitamins-Minerals (MULTIVITAMIN WITH IRON-MINERALS) liquid Take 30 mLs by mouth daily.     polyethylene glycol-electrolytes (TRILYTE) 420 g solution Take 4,000 mLs by mouth as directed. 4000 mL 0   meloxicam (MOBIC) 15 MG tablet Take 1 tablet (15 mg total) by mouth daily as needed for pain. (Patient not taking: Reported on 09/30/2021) 14 tablet 0   nitroGLYCERIN (NITROSTAT) 0.4 MG SL tablet PLACE ONE TABLET (0.4MG TOTAL) UNDER THETONGUE EVERY FIVE MINUTES AS NEEDED FOR CHEST PAIN 25 tablet 0    Results for orders placed or performed during the hospital encounter of 10/07/21 (from the past 48 hour(s))  Glucose, capillary     Status: Abnormal   Collection Time: 10/07/21  7:38 AM  Result Value Ref Range   Glucose-Capillary 101 (H) 70 - 99 mg/dL    Comment: Glucose reference range applies only to samples taken after fasting for at least 8 hours.   No results found.  Review of Systems  All other systems reviewed and are negative.   Blood pressure 133/71, pulse 61, temperature 98 F (36.7 C), temperature source Oral, resp. rate 14, height 5' 10"  (1.778 m), weight 97.5 kg, SpO2 96 %. Physical Exam  GENERAL: The patient is AO x3, in no acute distress. HEENT: Head is normocephalic and atraumatic. EOMI are intact. Mouth is well hydrated and without lesions. NECK: Supple. No masses LUNGS: Clear to auscultation. No presence of rhonchi/wheezing/rales. Adequate chest expansion HEART: RRR, normal s1 and s2. ABDOMEN: Soft, nontender, no guarding, no peritoneal signs, and nondistended. BS +. No  masses.  EXTREMITIES: Without any cyanosis, clubbing, rash, lesions or edema. NEUROLOGIC: AOx3, no focal motor deficit. SKIN: no jaundice, no rashes  Assessment/Plan 66 year old male with past medical history of anxiety, coronary artery disease on Plavix, hypertension, hyperlipidemia, coming for history of colonic polyps.  Will proceed with colonoscopy.  Harvel Quale, MD 10/07/2021, 8:45 AM

## 2021-10-07 NOTE — Discharge Instructions (Addendum)
You are being discharged to home.  Resume your previous diet.  We are waiting for your pathology results.  Your physician has recommended a repeat colonoscopy for surveillance based on pathology results.  Restart Plavix today.

## 2021-10-07 NOTE — Op Note (Signed)
[facility] Patient Name: Connor Sanford Procedure Date: 10/07/2021 8:18 AM MRN: [id number] Date of Birth: [birth year] Attending MD: Maylon Peppers ,  CSN: [id number] Age: 66 Admit Type: Outpatient Procedure:                Colonoscopy Indications:              Surveillance: Personal history of adenomatous                            polyps on last colonoscopy > 5 years ago Providers:                Maylon Peppers, Crystal Page, Everardo Pacific,                            Aram Candela Referring MD:              Medicines:                Monitored Anesthesia Care Complications:            No immediate complications. Estimated Blood Loss:     Estimated blood loss: none. Procedure:                Pre-Anesthesia Assessment:                           - Prior to the procedure, a History and Physical                            was performed, and patient medications, allergies                            and sensitivities were reviewed. The patient's                            tolerance of previous anesthesia was reviewed.                           - The risks and benefits of the procedure and the                            sedation options and risks were discussed with the                            patient. All questions were answered and informed                            consent was obtained.                           After obtaining informed consent, the colonoscope                            was passed under direct vision. Throughout the                            procedure, the patient's blood pressure, pulse, and  oxygen saturations were monitored continuously. The                            [id number]-[id number] ([id number]) scope was introduced through                            the anus and advanced to the the cecum, identified                            by appendiceal orifice and ileocecal valve. The                            colonoscopy was performed  without difficulty. The                            patient tolerated the procedure well. The quality                            of the bowel preparation was adequate. Scope In: 8:54:19 AM Scope Out: 9:20:47 AM Scope Withdrawal Time: 0 hours 18 minutes 53 seconds  Total Procedure Duration: 0 hours 26 minutes 28 seconds  Findings:      The perianal and digital rectal examinations were normal.      Six sessile polyps were found in the transverse colon and ascending       colon. The polyps were 3 to 6 mm in size. These polyps were removed with       a cold snare. Resection and retrieval were complete.      Non-bleeding internal hemorrhoids were found during retroflexion. The       hemorrhoids were small. Impression:               - Six 3 to 6 mm polyps in the transverse colon and                            in the ascending colon, removed with a cold snare.                            Resected and retrieved.                           - Non-bleeding internal hemorrhoids. Moderate Sedation:      Per Anesthesia Care Recommendation:           - Discharge patient to home (ambulatory).                           - Resume previous diet.                           - Await pathology results.                           - Repeat colonoscopy for surveillance based on                            pathology results.                           -  Restart Plavix today. Procedure Code(s):        --- Professional ---                           [phone], Colonoscopy, flexible; with removal of                            tumor(s), polyp(s), or other lesion(s) by snare                            technique Diagnosis Code(s):        --- Professional ---                           Z86.010, Personal history of colonic polyps                           K63.5, Polyp of colon                           K64.8, Other hemorrhoids CPT copyright 2019 American Medical Association. All rights reserved. The codes documented in this  report are preliminary and upon coder review may  be revised to meet current compliance requirements. Maylon Peppers, MD Maylon Peppers,  10/07/2021 9:24:41 AM This report has been signed electronically. Number of Addenda: 0

## 2021-10-07 NOTE — Anesthesia Preprocedure Evaluation (Addendum)
Anesthesia Evaluation  Patient identified by MRN, date of birth, ID band Patient awake    Reviewed: Allergy & Precautions, NPO status , Patient's Chart, lab work & pertinent test results, reviewed documented beta blocker date and time   Airway Mallampati: II  TM Distance: >3 FB Neck ROM: Full    Dental  (+) Dental Advisory Given, Missing   Pulmonary neg pulmonary ROS,    Pulmonary exam normal breath sounds clear to auscultation       Cardiovascular hypertension, Pt. on medications and Pt. on home beta blockers + CAD, + Past MI and + Cardiac Stents  Normal cardiovascular exam Rhythm:Regular Rate:Normal     Neuro/Psych  Headaches, PSYCHIATRIC DISORDERS Anxiety    GI/Hepatic negative GI ROS, Neg liver ROS,   Endo/Other  negative endocrine ROS  Renal/GU negative Renal ROS  negative genitourinary   Musculoskeletal negative musculoskeletal ROS (+)   Abdominal   Peds negative pediatric ROS (+)  Hematology negative hematology ROS (+)   Anesthesia Other Findings   Reproductive/Obstetrics negative OB ROS                            Anesthesia Physical Anesthesia Plan  ASA: 3  Anesthesia Plan: General   Post-op Pain Management: Minimal or no pain anticipated   Induction: Intravenous  PONV Risk Score and Plan: Propofol infusion  Airway Management Planned: Nasal Cannula and Natural Airway  Additional Equipment:   Intra-op Plan:   Post-operative Plan:   Informed Consent: I have reviewed the patients History and Physical, chart, labs and discussed the procedure including the risks, benefits and alternatives for the proposed anesthesia with the patient or authorized representative who has indicated his/her understanding and acceptance.     Dental advisory given  Plan Discussed with: CRNA and Surgeon  Anesthesia Plan Comments:        Anesthesia Quick Evaluation

## 2021-10-08 LAB — SURGICAL PATHOLOGY

## 2021-10-12 ENCOUNTER — Encounter (HOSPITAL_COMMUNITY): Payer: Self-pay | Admitting: Gastroenterology

## 2021-12-16 ENCOUNTER — Other Ambulatory Visit: Payer: Self-pay | Admitting: Cardiovascular Disease

## 2021-12-16 DIAGNOSIS — I2510 Atherosclerotic heart disease of native coronary artery without angina pectoris: Secondary | ICD-10-CM

## 2022-01-19 ENCOUNTER — Ambulatory Visit (INDEPENDENT_AMBULATORY_CARE_PROVIDER_SITE_OTHER): Payer: Medicare HMO | Admitting: Nurse Practitioner

## 2022-01-19 VITALS — BP 106/64 | HR 54 | Temp 97.4°F | Ht 70.0 in | Wt 212.0 lb

## 2022-01-19 DIAGNOSIS — Z0000 Encounter for general adult medical examination without abnormal findings: Secondary | ICD-10-CM | POA: Diagnosis not present

## 2022-01-19 DIAGNOSIS — J329 Chronic sinusitis, unspecified: Secondary | ICD-10-CM | POA: Diagnosis not present

## 2022-01-19 MED ORDER — AMOXICILLIN 875 MG PO TABS: 875.0000 mg | ORAL_TABLET | Freq: Two times a day (BID) | ORAL | 0 refills | Status: AC

## 2022-01-19 NOTE — Patient Instructions (Signed)
Thank you for coming for your annual wellness visit.  Please follow through on any advice that was given to you by today's visit. Remember to maintain compliance with your medications as discussed today.  Also remember it is important to eat a healthy diet and to stay physically active on a daily basis.  Please follow through with any testing or recommended followup office visits as was discussed today. You are due the following test coming up:  Colonoscopy up to date due 10/07/2024 Flu vaccine postponed today until next visit on 02/12/2022 Pneumonia vaccine postponed today until next visit on 02/12/2022      Finally remembered that the annual wellness visit does not take the place of regularly scheduled office visits  chronic health problems such as hypertension/diabetes/cholesterol visits.

## 2022-01-19 NOTE — Progress Notes (Signed)
   Subjective:    Patient ID: Connor Sanford, male    DOB: [birth year], 66 y.o.   MRN: [id number]  HPI AWV- Annual Wellness Visit  The patient was seen for their annual wellness visit. The patient's past medical history, surgical history, and family history were reviewed. Pertinent vaccines were reviewed ( tetanus, pneumonia, shingles, flu) The patient's medication list was reviewed and updated.  The height and weight were entered.  BMI recorded in electronic record elsewhere  Cognitive screening was completed. Outcome of Mini - Cog: 5   Falls /depression screening electronically recorded within record elsewhere  Current tobacco usage:no (All patients who use tobacco were given written and verbal information on quitting)  Recent listing of emergency department/hospitalizations over the past year were reviewed.  current specialist the patient sees on a regular basis: ENT, cardiology   Medicare annual wellness visit patient questionnaire was reviewed.  A written screening schedule for the patient for the next 5-10 years was given. Appropriate discussion of followup regarding next visit was discussed.      Review of Systems  HENT:  Positive for congestion and sinus pressure.        Concerns about sinus pressure x2 months. No fevers, chills, body aches. Using Nettipot, allergy medication, and nasal sprays to help with symptoms.   All other systems reviewed and are negative.      Objective:   Physical Exam Constitutional:      General: He is not in acute distress.    Appearance: Normal appearance. He is normal weight. He is not ill-appearing, toxic-appearing or diaphoretic.  HENT:     Head: Normocephalic and atraumatic.     Right Ear: Tympanic membrane, ear canal and external ear normal.     Left Ear: Tympanic membrane, ear canal and external ear normal.     Nose: Congestion and rhinorrhea present.     Mouth/Throat:     Mouth: Mucous membranes are moist.     Pharynx:  Oropharynx is clear.  Musculoskeletal:     Cervical back: Normal range of motion and neck supple. No rigidity or tenderness.  Lymphadenopathy:     Cervical: No cervical adenopathy.  Neurological:     Mental Status: He is alert.  Psychiatric:        Mood and Affect: Mood normal.        Behavior: Behavior normal.           Assessment & Plan:   1. Medicare annual wellness visit, initial Adult wellness-complete.wellness physical was conducted today. Importance of diet and exercise were discussed in detail.  Importance of stress reduction and healthy living were discussed.  In addition to this a discussion regarding safety was also covered.  We also reviewed over immunizations and gave recommendations regarding current immunization needed for age.   In addition to this additional areas were also touched on including: Preventative health exams needed:  Colonoscopy up to date due 10/07/2024 Flu vaccine postponed today until next visit on 02/12/2022 Pneumonia vaccine postponed today until next visit on 02/12/2022  Patient was advised yearly wellness exam   2. Rhinosinusitis Amoxicillin '875mg'$  BID x7 days

## 2022-02-05 ENCOUNTER — Other Ambulatory Visit: Payer: Self-pay | Admitting: Cardiovascular Disease

## 2022-02-05 DIAGNOSIS — I2510 Atherosclerotic heart disease of native coronary artery without angina pectoris: Secondary | ICD-10-CM

## 2022-02-12 ENCOUNTER — Ambulatory Visit (INDEPENDENT_AMBULATORY_CARE_PROVIDER_SITE_OTHER): Payer: Medicare HMO | Admitting: Family Medicine

## 2022-02-12 DIAGNOSIS — Z130 Encounter for screening for diseases of the blood and blood-forming organs and certain disorders involving the immune mechanism: Secondary | ICD-10-CM | POA: Diagnosis not present

## 2022-02-12 DIAGNOSIS — E785 Hyperlipidemia, unspecified: Secondary | ICD-10-CM | POA: Diagnosis not present

## 2022-02-12 DIAGNOSIS — J320 Chronic maxillary sinusitis: Secondary | ICD-10-CM

## 2022-02-12 DIAGNOSIS — J329 Chronic sinusitis, unspecified: Secondary | ICD-10-CM | POA: Insufficient documentation

## 2022-02-12 DIAGNOSIS — E119 Type 2 diabetes mellitus without complications: Secondary | ICD-10-CM

## 2022-02-12 DIAGNOSIS — I2510 Atherosclerotic heart disease of native coronary artery without angina pectoris: Secondary | ICD-10-CM

## 2022-02-12 MED ORDER — DOXYCYCLINE HYCLATE 100 MG PO TABS: 100.0000 mg | ORAL_TABLET | Freq: Two times a day (BID) | ORAL | 0 refills | Status: DC

## 2022-02-12 NOTE — Assessment & Plan Note (Signed)
Has been stable.  Lipid panel ordered.  Continue atorvastatin.

## 2022-02-12 NOTE — Assessment & Plan Note (Signed)
Stable.  Continue current medications.  Labs ordered.

## 2022-02-12 NOTE — Patient Instructions (Signed)
Take antibiotic with food.  Labs when you can.  Follow up in 6 months.  Take care  Dr. Lacinda Axon

## 2022-02-12 NOTE — Assessment & Plan Note (Signed)
Treating with doxycycline.  If fails to improve or worsens, will need to see ENT.

## 2022-02-12 NOTE — Progress Notes (Signed)
Subjective:  Patient ID: Connor Sanford, male    DOB: 1955-05-03  Age: 66 y.o. MRN: [id number]  CC: Chief Complaint  Patient presents with   Diabetes    Follow up , discuss taking flu shot    Anxiety    Medication refill    HPI:  66 year old male with coronary artery disease, type 2 diabetes which was recently diagnosed, anxiety, hyperlipidemia presents for follow-up.  Coronary artery disease is stable.  Asymptomatic.  Doing well on aspirin, Plavix, metoprolol and atorvastatin.  Needs repeat A1c.  Currently on metformin.  Doing well.  Lipids have been well-controlled on Lipitor.  No reported side effects.  Patient reports ongoing issues with left-sided nasal congestion and left-sided sinus congestion.  This has been going on for the past few months.  Was given amoxicillin previously without significant improvement.  He has been using several over-the-counter antihistamines and Flonase without resolution.  Patient Active Problem List   Diagnosis Date Noted   Type 2 diabetes mellitus without complication, without long-term current use of insulin ([place]) 02/12/2022   Chronic sinusitis 02/12/2022   Annual physical exam 08/14/2021   Occipital headache 07/28/2021   Anxiety 07/28/2021   Hyperlipidemia 03/04/2021   CAD (coronary artery disease) 01/03/2020   Hx of repair of left rotator cuff 12/27/2013    Social Hx   Social History   Socioeconomic History   Marital status: Married    Spouse name: Not on file   Number of children: Not on file   Years of education: Not on file   Highest education level: Not on file  Occupational History   Not on file  Tobacco Use   Smoking status: Never   Smokeless tobacco: Never  Vaping Use   Vaping Use: Never used  Substance and Sexual Activity   Alcohol use: No   Drug use: No   Sexual activity: Yes    Birth control/protection: None  Other Topics Concern   Not on file  Social History Narrative   Not on file   Social  Determinants of Health   Financial Resource Strain: Not on file  Food Insecurity: Not on file  Transportation Needs: Not on file  Physical Activity: Not on file  Stress: Not on file  Social Connections: Not on file    Review of Systems Per HPI  Objective:  BP 132/79   Pulse (!) 57   Temp 98.1 F (36.7 C)   Ht _0  (1.778 m)   Wt 213 lb (96.6 kg)   SpO2 99%   BMI 30.56 kg/m      02/12/2022    2:33 PM 01/19/2022    4:18 PM 10/07/2021    9:23 AM  BP/Weight  Systolic BP 270 786 91  Diastolic BP 79 64 44  Wt. (Lbs) 213 212   BMI 30.56 kg/m2 30.42 kg/m2     Physical Exam Vitals and nursing note reviewed.  Constitutional:      General: He is not in acute distress.    Appearance: Normal appearance.  HENT:     Head: Normocephalic and atraumatic.     Nose: Congestion present.  Eyes:     General:        Right eye: No discharge.        Left eye: No discharge.     Conjunctiva/sclera: Conjunctivae normal.  Cardiovascular:     Rate and Rhythm: Normal rate and regular rhythm.  Pulmonary:     Effort: Pulmonary effort is normal.  Breath sounds: Normal breath sounds. No wheezing, rhonchi or rales.  Neurological:     Mental Status: He is alert.  Psychiatric:        Mood and Affect: Mood normal.        Behavior: Behavior normal.     Assessment & Plan:   Problem List Items Addressed This Visit       Cardiovascular and Mediastinum   CAD (coronary artery disease)    Stable.  Continue current medications.  Labs ordered.        Respiratory   Chronic sinusitis    Treating with doxycycline.  If fails to improve or worsens, will need to see ENT.      Relevant Medications   doxycycline (VIBRA-TABS) 100 MG tablet     Endocrine   Type 2 diabetes mellitus without complication, without long-term current use of insulin (HCC)    Reassessing A1c.  Continue metformin.      Relevant Orders   CMP14+EGFR   Hemoglobin A1c   Microalbumin / creatinine urine ratio      Other   Hyperlipidemia    Has been stable.  Lipid panel ordered.  Continue atorvastatin.      Relevant Orders   Lipid panel   Other Visit Diagnoses     Screening for deficiency anemia       Relevant Orders   CBC       Meds ordered this encounter  Medications   doxycycline (VIBRA-TABS) 100 MG tablet    Sig: Take 1 tablet (100 mg total) by mouth 2 (two) times daily.    Dispense:  20 tablet    Refill:  0    Follow-up:  6 months  [place] DO [place]

## 2022-02-12 NOTE — Assessment & Plan Note (Signed)
Reassessing A1c.  Continue metformin.

## 2022-02-15 DIAGNOSIS — E785 Hyperlipidemia, unspecified: Secondary | ICD-10-CM | POA: Diagnosis not present

## 2022-02-15 DIAGNOSIS — Z130 Encounter for screening for diseases of the blood and blood-forming organs and certain disorders involving the immune mechanism: Secondary | ICD-10-CM | POA: Diagnosis not present

## 2022-02-15 DIAGNOSIS — E119 Type 2 diabetes mellitus without complications: Secondary | ICD-10-CM | POA: Diagnosis not present

## 2022-02-16 LAB — CMP14+EGFR
ALT: 22 IU/L (ref 0–44)
AST: 21 IU/L (ref 0–40)
Albumin/Globulin Ratio: 2.9 — ABNORMAL HIGH (ref 1.2–2.2)
Albumin: 4.7 g/dL (ref 3.9–4.9)
Alkaline Phosphatase: 89 IU/L (ref 44–121)
BUN/Creatinine Ratio: 21 (ref 10–24)
BUN: 21 mg/dL (ref 8–27)
Bilirubin Total: 0.5 mg/dL (ref 0.0–1.2)
CO2: 24 mmol/L (ref 20–29)
Calcium: 9.5 mg/dL (ref 8.6–10.2)
Chloride: 106 mmol/L (ref 96–106)
Creatinine, Ser: 0.98 mg/dL (ref 0.76–1.27)
Globulin, Total: 1.6 g/dL (ref 1.5–4.5)
Glucose: 110 mg/dL — ABNORMAL HIGH (ref 70–99)
Potassium: 4.7 mmol/L (ref 3.5–5.2)
Sodium: 142 mmol/L (ref 134–144)
Total Protein: 6.3 g/dL (ref 6.0–8.5)
eGFR: 86 mL/min/{1.73_m2} (ref >59)

## 2022-02-16 LAB — LIPID PANEL
Chol/HDL Ratio: 2.4 ratio (ref 0.0–5.0)
Cholesterol, Total: 100 mg/dL (ref 100–199)
HDL: 42 mg/dL (ref >39)
LDL Chol Calc (NIH): 47 mg/dL (ref 0–99)
Triglycerides: 42 mg/dL (ref 0–149)
VLDL Cholesterol Cal: 11 mg/dL (ref 5–40)

## 2022-02-16 LAB — CBC
Hematocrit: 42.8 % (ref 37.5–51.0)
Hemoglobin: 14.6 g/dL (ref 13.0–17.7)
MCH: 32.2 pg (ref 26.6–33.0)
MCHC: 34.1 g/dL (ref 31.5–35.7)
MCV: 95 fL (ref 79–97)
Platelets: 178 10*3/uL (ref 150–450)
RBC: 4.53 x10E6/uL (ref 4.14–5.80)
RDW: 11.8 % (ref 11.6–15.4)
WBC: 6.7 10*3/uL (ref 3.4–10.8)

## 2022-02-16 LAB — MICROALBUMIN / CREATININE URINE RATIO
Creatinine, Urine: 106.5 mg/dL
Microalb/Creat Ratio: <3 mg/g creat (ref 0–29)
Microalbumin, Urine: <3 ug/mL

## 2022-02-16 LAB — HEMOGLOBIN A1C
Est. average glucose Bld gHb Est-mCnc: 131 mg/dL
Hgb A1c MFr Bld: 6.2 % — ABNORMAL HIGH (ref 4.8–5.6)

## 2022-03-02 ENCOUNTER — Other Ambulatory Visit: Payer: Self-pay | Admitting: Family Medicine

## 2022-03-02 ENCOUNTER — Telehealth: Payer: Self-pay | Admitting: *Deleted

## 2022-03-02 MED ORDER — NIRMATRELVIR/RITONAVIR (PAXLOVID)TABLET: 3.0000 | ORAL_TABLET | Freq: Two times a day (BID) | ORAL | 0 refills | Status: AC

## 2022-03-02 NOTE — Telephone Encounter (Signed)
Patient called and stated he was having a lot of congestion that started Friday and took a test on Sunday and tested positive for Covid. Patient requested antiviral. Patient states he feels ok except for the congestion and has no fever or SOB  [facility] on [address]

## 2022-03-02 NOTE — Telephone Encounter (Signed)
Patient notified

## 2022-03-02 NOTE — Telephone Encounter (Signed)
Coral Spikes, DO     Rx sent to [facility].

## 2022-03-24 ENCOUNTER — Other Ambulatory Visit: Payer: Self-pay | Admitting: Family Medicine

## 2022-03-24 DIAGNOSIS — F419 Anxiety disorder, unspecified: Secondary | ICD-10-CM

## 2022-04-09 ENCOUNTER — Ambulatory Visit: Payer: Medicare HMO | Attending: Cardiovascular Disease | Admitting: Cardiovascular Disease

## 2022-04-09 ENCOUNTER — Encounter: Payer: Self-pay | Admitting: Cardiovascular Disease

## 2022-04-09 VITALS — BP 108/62 | HR 63 | Ht 70.0 in | Wt 214.8 lb

## 2022-04-09 DIAGNOSIS — E119 Type 2 diabetes mellitus without complications: Secondary | ICD-10-CM | POA: Diagnosis not present

## 2022-04-09 DIAGNOSIS — I2510 Atherosclerotic heart disease of native coronary artery without angina pectoris: Secondary | ICD-10-CM | POA: Diagnosis not present

## 2022-04-09 DIAGNOSIS — E785 Hyperlipidemia, unspecified: Secondary | ICD-10-CM

## 2022-04-09 NOTE — Assessment & Plan Note (Signed)
History of hyperlipidemia on high-dose statin therapy with lipid profile performed 02/15/2022 revealing total cholesterol 100, LDL 47 and HDL 42.

## 2022-04-09 NOTE — Progress Notes (Signed)
04/09/2022 Connor Sanford   01/29/1956  [id number]  Primary Physician Coral Spikes, DO Primary Cardiologist: [name] MD Garret Reddish, [place], [place]  HPI:  Connor Sanford is a 67 y.o.   mildly overweight married Caucasian male, father of 2, who retired on Jun 29 2012 from working at the [employer]. I last saw him in the office 02/11/2021. He has a history of hyperlipidemia as well as a strong family history of heart disease and a father who had bypass surgery age 73. 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 He did have moderate inferolateral hypokinesia with an EF of 45% to 50%, with a peak CPK of 1000 and MB of 143. Echocardiogram performed April 06, 2009, showed normal LV systolic function without segmental wall motion abnormalities, and Myoview showed inferoapical scar without ischemia. He did participate in cardiac .  He did fall down in January and injured his left shoulder and now requiring rotator cuff repair. A Myoview stress test performed on 05/23/13 for preoperative clearance revealed a small area of apical scar with preserved ejection fraction.   He had  developed dyspnea on exertion and predictable exertional chest pain.  He saw Coletta Memos, FNP in the office who ordered a Myoview stress test on 12/28/2019 there is remarkable for inferolateral ischemia.  Because of this he was referred for diagnostic coronary angiography which I performed on 01/03/2020. This showed a patent diagonal branch stent with a 95% proximal AV groove circumflex stenosis which I stented using a synergy 3 mm x 60 mm long drug-eluting stent postdilated to 3.3 mm. His symptoms have subsequently completely resolved.  Since I saw him a year ago he continues to do well.  He denies chest pain or shortness of breath..   Current Meds  Medication Sig   ALPRAZolam (XANAX) 0.5 MG tablet  TAKE ONE TABLET (0.5MG TOTAL) BY MOUTH AT BEDTIME AS NEEDED FOR ANXIETY OR SLEEP   aspirin EC 81 MG tablet Take 81 mg by mouth every evening.   atorvastatin (LIPITOR) 80 MG tablet Take 1 tablet (80 mg total) by mouth daily.   cetirizine (ZYRTEC) 10 MG tablet Take 10 mg by mouth daily as needed (allergies).   clopidogrel (PLAVIX) 75 MG tablet TAKE ONE (1) TABLET BY MOUTH EVERY DAY   Coenzyme Q10 (COQ10) 100 MG CAPS Take 100 mg by mouth daily.   fluticasone (FLONASE) 50 MCG/ACT nasal spray 2 sprays each nare daily (Patient taking differently: Place 1 spray into both nostrils daily as needed for allergies.)   Menthol, Topical Analgesic, (BIOFREEZE) 4 % GEL Apply 1 application topically daily as needed (pain).   metFORMIN (GLUCOPHAGE) 500 MG tablet Take 1 tablet (500 mg total) by mouth 2 (two) times daily with a meal. (Patient taking differently: Take 500 mg by mouth every evening.)   metoprolol tartrate (LOPRESSOR) 25 MG tablet Take 0.5 tablets (12.5 mg total) by mouth 2 (two) times daily.   Multiple Vitamins-Minerals (MULTIVITAMIN WITH IRON-MINERALS) liquid Take 30 mLs by mouth daily.   nitroGLYCERIN (NITROSTAT) 0.4 MG SL tablet PLACE ONE TABLET (0.4MG TOTAL) UNDER THETONGUE EVERY FIVE MINUTES AS NEEDED FOR CHEST PAIN     No Known Allergies  Social History   Socioeconomic History   Marital status: Married    Spouse name: Not on file   Number of children: Not on file   Years of education: Not  on file   Highest education level: Not on file  Occupational History   Not on file  Tobacco Use   Smoking status: Never   Smokeless tobacco: Never  Vaping Use   Vaping Use: Never used  Substance and Sexual Activity   Alcohol use: No   Drug use: No   Sexual activity: Yes    Birth control/protection: None  Other Topics Concern   Not on file  Social History Narrative   Not on file   Social Determinants of Health   Financial Resource Strain: Not on file  Food Insecurity: Not on file   Transportation Needs: Not on file  Physical Activity: Not on file  Stress: Not on file  Social Connections: Not on file  Intimate Partner Violence: Not on file     Review of Systems: General: negative for chills, fever, night sweats or weight changes.  Cardiovascular: negative for chest pain, dyspnea on exertion, edema, orthopnea, palpitations, paroxysmal nocturnal dyspnea or shortness of breath Dermatological: negative for rash Respiratory: negative for cough or wheezing Urologic: negative for hematuria Abdominal: negative for nausea, vomiting, diarrhea, bright red blood per rectum, melena, or hematemesis Neurologic: negative for visual changes, syncope, or dizziness All other systems reviewed and are otherwise negative except as noted above.    Blood pressure 108/62, pulse 63, height 5' 10"$  (1.778 m), weight 214 lb 12.8 oz (97.4 kg), SpO2 93 %.  General appearance: alert and no distress Neck: no adenopathy, no carotid bruit, no JVD, supple, symmetrical, trachea midline, and thyroid not enlarged, symmetric, no tenderness/mass/nodules Lungs: clear to auscultation bilaterally Heart: regular rate and rhythm, S1, S2 normal, no murmur, click, rub or gallop Extremities: extremities normal, atraumatic, no cyanosis or edema Pulses: 2+ and symmetric Skin: Skin color, texture, turgor normal. No rashes or lesions Neurologic: Grossly normal  EKG sinus rhythm at 63 with rightward axis and low limb voltage.  I personally reviewed this EKG.  ASSESSMENT AND PLAN:   CAD (coronary artery disease) History of CAD status post myocardial infarction 01/27/2009 secondary to her occluded large second diagonal branch which I stented using 2 overlapping bare-metal stents.  He had moderate inferolateral hypokinesia with an EF of 45 to 50% and a peak CPK with thousand, and MB of 143.  His EF normalized by echo 04/06/2009.  Because of dyspnea and chest pain a Myoview stress test was performed 12/28/2019 showing  inferolateral ischemia.  Cardiac cath performed by myself 01/03/2020 showed a patent diagonal branch stent with the proximal AV groove circumflex stenosis which I stented using a 3 mm x 16 mm long drug-eluting stent postdilated 3.3 mm.  Symptoms resolved.  He is active and asymptomatic on aspirin and clopidogrel.  Hyperlipidemia History of hyperlipidemia on high-dose statin therapy with lipid profile performed 02/15/2022 revealing total cholesterol 100, LDL 47 and HDL 42.     [name] MD FACP,FACC,FAHA, [facility] 04/09/2022 3:42 PM

## 2022-04-09 NOTE — Assessment & Plan Note (Signed)
History of CAD status post myocardial infarction 01/27/2009 secondary to her occluded large second diagonal branch which I stented using 2 overlapping bare-metal stents.  He had moderate inferolateral hypokinesia with an EF of 45 to 50% and a peak CPK with thousand, and MB of 143.  His EF normalized by echo 04/06/2009.  Because of dyspnea and chest pain a Myoview stress test was performed 12/28/2019 showing inferolateral ischemia.  Cardiac cath performed by myself 01/03/2020 showed a patent diagonal branch stent with the proximal AV groove circumflex stenosis which I stented using a 3 mm x 16 mm long drug-eluting stent postdilated 3.3 mm.  Symptoms resolved.  He is active and asymptomatic on aspirin and clopidogrel.

## 2022-04-09 NOTE — Patient Instructions (Signed)

## 2022-04-29 ENCOUNTER — Encounter: Payer: Self-pay | Admitting: Radiology

## 2022-08-16 DIAGNOSIS — H5213 Myopia, bilateral: Secondary | ICD-10-CM | POA: Diagnosis not present

## 2022-08-16 LAB — HM DIABETES EYE EXAM

## 2022-08-30 DIAGNOSIS — L820 Inflamed seborrheic keratosis: Secondary | ICD-10-CM | POA: Diagnosis not present

## 2022-08-30 DIAGNOSIS — X32XXXA Exposure to sunlight, initial encounter: Secondary | ICD-10-CM | POA: Diagnosis not present

## 2022-08-30 DIAGNOSIS — L570 Actinic keratosis: Secondary | ICD-10-CM | POA: Diagnosis not present

## 2022-09-01 DIAGNOSIS — H5213 Myopia, bilateral: Secondary | ICD-10-CM | POA: Diagnosis not present

## 2022-09-27 DIAGNOSIS — L820 Inflamed seborrheic keratosis: Secondary | ICD-10-CM | POA: Diagnosis not present

## 2022-10-02 ENCOUNTER — Other Ambulatory Visit: Payer: Self-pay | Admitting: Cardiovascular Disease

## 2022-10-02 DIAGNOSIS — I2510 Atherosclerotic heart disease of native coronary artery without angina pectoris: Secondary | ICD-10-CM

## 2022-10-27 DIAGNOSIS — L820 Inflamed seborrheic keratosis: Secondary | ICD-10-CM | POA: Diagnosis not present

## 2022-11-09 ENCOUNTER — Other Ambulatory Visit: Payer: Self-pay | Admitting: Family Medicine

## 2022-12-24 ENCOUNTER — Other Ambulatory Visit: Payer: Self-pay | Admitting: Cardiovascular Disease

## 2022-12-24 DIAGNOSIS — I2510 Atherosclerotic heart disease of native coronary artery without angina pectoris: Secondary | ICD-10-CM

## 2023-02-01 NOTE — Telephone Encounter (Signed)
This encounter was created in error - please disregard.

## 2023-02-02 NOTE — Telephone Encounter (Signed)
Pt has phy on 03/07/22 need blood work order for phy please reach out to patient went blood work is ordered he has been told by [id number] told him twice they would order and change his AWV to a Phy   [phone]

## 2023-02-04 ENCOUNTER — Ambulatory Visit (INDEPENDENT_AMBULATORY_CARE_PROVIDER_SITE_OTHER): Payer: Medicare HMO

## 2023-02-04 ENCOUNTER — Other Ambulatory Visit: Payer: Self-pay

## 2023-02-04 VITALS — Ht 70.0 in | Wt 215.0 lb

## 2023-02-04 DIAGNOSIS — E119 Type 2 diabetes mellitus without complications: Secondary | ICD-10-CM

## 2023-02-04 DIAGNOSIS — Z0000 Encounter for general adult medical examination without abnormal findings: Secondary | ICD-10-CM | POA: Diagnosis not present

## 2023-02-04 DIAGNOSIS — I2510 Atherosclerotic heart disease of native coronary artery without angina pectoris: Secondary | ICD-10-CM

## 2023-02-04 DIAGNOSIS — Z125 Encounter for screening for malignant neoplasm of prostate: Secondary | ICD-10-CM

## 2023-02-04 DIAGNOSIS — E785 Hyperlipidemia, unspecified: Secondary | ICD-10-CM

## 2023-02-04 NOTE — Progress Notes (Signed)
 Because this visit was a virtual/telehealth visit,  certain criteria was not obtained, such a blood pressure, CBG if applicable, and timed get up and go. Any medications not marked as "taking" were not mentioned during the medication reconciliation part of the visit. Any vitals not documented were not able to be obtained due to this being a telehealth visit or patient was unable to self-report a recent blood pressure reading due to a lack of equipment at home via telehealth. Vitals that have been documented are verbally provided by the patient.   Subjective:   Connor Sanford is a 67 y.o. male who presents for Medicare Annual/Subsequent preventive examination.  Visit Complete: Virtual I connected with  Connor Sanford on 02/04/23 by a audio enabled telemedicine application and verified that I am speaking with the correct person using two identifiers.  Patient Location: Home  Provider Location: Home Office  I discussed the limitations of evaluation and management by telemedicine. The patient expressed understanding and agreed to proceed.  Vital Signs: Because this visit was a virtual/telehealth visit, some criteria may be missing or patient reported. Any vitals not documented were not able to be obtained and vitals that have been documented are patient reported.  Patient Medicare AWV questionnaire was completed by the patient on na; I have confirmed that all information answered by patient is correct and no changes since this date.  Cardiac Risk Factors include: advanced age (>45men, >36 women);diabetes mellitus;dyslipidemia;hypertension;male gender;obesity (BMI >30kg/m2)     Objective:    Today's Vitals   02/04/23 1406  Weight: 215 lb (97.5 kg)  Height: 5\' 10"  (1.778 m)   Body mass index is 30.85 kg/m.     02/04/2023    2:09 PM 10/07/2021    7:13 AM 10/30/2020    8:33 AM 01/03/2020   11:55 AM 06/13/2013    6:36 AM 06/08/2013    9:25 AM 12/27/2012    7:19 AM  Advanced Directives   Does Patient Have a Medical Advance Directive? No No No No Patient does not have advance directive;Patient would not like information Patient does not have advance directive;Patient would not like information Patient does not have advance directive;Patient would not like information  Would patient like information on creating a medical advance directive? No - Patient declined No - Patient declined No - Patient declined Yes (MAU/Ambulatory/Procedural Areas - Information given)     Pre-existing out of facility DNR order (yellow form or pink MOST form)      No No    Current Medications (verified) Outpatient Encounter Medications as of 02/04/2023  Medication Sig   ALPRAZolam (XANAX) 0.5 MG tablet TAKE ONE TABLET (0.5MG  TOTAL) BY MOUTH AT BEDTIME AS NEEDED FOR ANXIETY OR SLEEP   aspirin EC 81 MG tablet Take 81 mg by mouth every evening.   atorvastatin (LIPITOR) 80 MG tablet TAKE ONE TABLET (80MG  TOTAL) BY MOUTH DAILY   cetirizine (ZYRTEC) 10 MG tablet Take 10 mg by mouth daily as needed (allergies).   clopidogrel (PLAVIX) 75 MG tablet TAKE ONE (1) TABLET BY MOUTH EVERY DAY   Coenzyme Q10 (COQ10) 100 MG CAPS Take 100 mg by mouth daily.   fluticasone (FLONASE) 50 MCG/ACT nasal spray 2 sprays each nare daily (Patient taking differently: Place 1 spray into both nostrils daily as needed for allergies.)   Menthol, Topical Analgesic, (BIOFREEZE) 4 % GEL Apply 1 application topically daily as needed (pain).   metFORMIN (GLUCOPHAGE) 500 MG tablet Take 1 tablet (500 mg total) by mouth 2 (  two) times daily with a meal. (Patient taking differently: Take 500 mg by mouth every evening.)   metoprolol tartrate (LOPRESSOR) 25 MG tablet TAKE ONE-HALF TABLET (12.5MG  TOTAL) BY MOUTH TWO TIMES DAILY   Multiple Vitamins-Minerals (MULTIVITAMIN WITH IRON-MINERALS) liquid Take 30 mLs by mouth daily.   nitroGLYCERIN (NITROSTAT) 0.4 MG SL tablet PLACE ONE TABLET (0.4MG  TOTAL) UNDER THETONGUE EVERY FIVE MINUTES AS NEEDED FOR  CHEST PAIN   No facility-administered encounter medications on file as of 02/04/2023.    Allergies (verified) Patient has no known allergies.   History: Past Medical History:  Diagnosis Date   Anxiety    Coronary artery disease    H/O seasonal allergies    Hyperlipidemia    Hypertension    MI (myocardial infarction) (HCC) 03/01/2008   Past Surgical History:  Procedure Laterality Date   CARDIAC CATHETERIZATION  01/27/2009   Occluded second diagonal branch-stented with a 2.25x93mm Integrity Medtronic bare-metal stent at 12 atm (2.38-mm) resulting in reduction of 100% stenosis to 0% residual. Small fairly focal stenosis just distalof stented segment-stented with a 2.25x7mm Integrity Medtronic bare-metal stent at 12 atm, reduced to 0% residual with TIMI3 flow.   CARDIOVASCULAR STRESS TEST  04/16/2009   Mild perfusion defect seen in Mid Anterior, Apical Anterior, and Apical regions - consistent with infarct/scar. No scintigraphic evidence of inducible myocardial ischemia. EKG negative for ischemia. No ECG changes.   COLONOSCOPY N/A 12/27/2012   Procedure: COLONOSCOPY;  Surgeon: Malissa Hippo, MD;  Location: [facility];  Service: Endoscopy;  Laterality: N/A;  730   COLONOSCOPY WITH PROPOFOL N/A 10/07/2021   Procedure: COLONOSCOPY WITH PROPOFOL;  Surgeon: Dolores Frame, MD;  Location: [facility];  Service: Gastroenterology;  Laterality: N/A;  830 ASA 2   CORONARY ANGIOPLASTY     2 stents   CORONARY STENT INTERVENTION  01/03/2020   Procedure: CORONARY STENT INTERVENTION;  Surgeon: Runell Gess, MD;  Location: [facility];  Service: Cardiovascular;;   LEFT HEART CATH AND CORONARY ANGIOGRAPHY N/A 01/03/2020   Procedure: LEFT HEART CATH AND CORONARY ANGIOGRAPHY;  Surgeon: Runell Gess, MD;  Location: [facility];  Service: Cardiovascular;  Laterality: N/A;   POLYPECTOMY  10/07/2021   Procedure: POLYPECTOMY INTESTINAL;  Surgeon: Dolores Frame,  MD;  Location: [facility];  Service: Gastroenterology;;   RADIOLOGY WITH ANESTHESIA N/A 10/30/2020   Procedure: MRI BRAIN WITH AND WITHOUT CONTRAST;  Surgeon: Radiologist, Medication, MD;  Location: [facility];  Service: Radiology;  Laterality: N/A;   RESECTION DISTAL CLAVICAL Left 06/13/2013   Procedure: RESECTION DISTAL CLAVICAL;  Surgeon: Vickki Hearing, MD;  Location: [facility];  Service: Orthopedics;  Laterality: Left;   SHOULDER OPEN ROTATOR CUFF REPAIR Left 06/13/2013   Procedure: ROTATOR CUFF REPAIR SHOULDER OPEN;  Surgeon: Vickki Hearing, MD;  Location: [facility];  Service: Orthopedics;  Laterality: Left;   TRANSTHORACIC ECHOCARDIOGRAM  04/16/2009   EF >55%, no significant valvular abnormalities.   Family History  Problem Relation Age of Onset   Heart disease Father    Heart disease Paternal Grandmother    Colon cancer Neg Hx    Social History   Socioeconomic History   Marital status: Married    Spouse name: Not on file   Number of children: Not on file   Years of education: Not on file   Highest education level: Not on file  Occupational History   Not on file  Tobacco Use   Smoking status: Never  Smokeless tobacco: Never  Vaping Use   Vaping status: Never Used  Substance and Sexual Activity   Alcohol use: No   Drug use: No   Sexual activity: Yes    Birth control/protection: None  Other Topics Concern   Not on file  Social History Narrative   Not on file   Social Determinants of Health   Financial Resource Strain: Low Risk  (02/04/2023)   Overall Financial Resource Strain (CARDIA)    Difficulty of Paying Living Expenses: Not hard at all  Food Insecurity: No Food Insecurity (02/04/2023)   Hunger Vital Sign    Worried About Running Out of Food in the Last Year: Never true    Ran Out of Food in the Last Year: Never true  Transportation Needs: No Transportation Needs (02/04/2023)   PRAPARE - Administrator, Civil Service (Medical): No    Lack of  Transportation (Non-Medical): No  Physical Activity: Sufficiently Active (02/04/2023)   Exercise Vital Sign    Days of Exercise per Week: 7 days    Minutes of Exercise per Session: 30 min  Stress: No Stress Concern Present (02/04/2023)   [employer] of Occupational Health - Occupational Stress Questionnaire    Feeling of Stress : Not at all  Social Connections: Socially Integrated (02/04/2023)   Social Connection and Isolation Panel [NHANES]    Frequency of Communication with Friends and Family: More than three times a week    Frequency of Social Gatherings with Friends and Family: More than three times a week    Attends Religious Services: More than 4 times per year    Active Member of [employer] or Organizations: Yes    Attends Engineer, structural: More than 4 times per year    Marital Status: Married    Tobacco Counseling Counseling given: Yes   Clinical Intake:  Pre-visit preparation completed: Yes  Pain : No/denies pain     BMI - recorded: 30.85 Nutritional Status: BMI > 30  Obese Nutritional Risks: None Diabetes: Yes CBG done?: No (telehealth viisit) Did pt. bring in CBG monitor from home?: No  How often do you need to have someone help you when you read instructions, pamphlets, or other written materials from your doctor or pharmacy?: 1 - Never  Interpreter Needed?: No  Information entered by ::  Connor Sanford, CMA   Activities of Daily Living    02/04/2023    2:07 PM  In your present state of health, do you have any difficulty performing the following activities:  Hearing? 1  Comment wears hearing aids  Vision? 0  Comment sees Dr. Charise Killian [facility]  Difficulty concentrating or making decisions? 0  Walking or climbing stairs? 0  Dressing or bathing? 0  Doing errands, shopping? 0  Preparing Food and eating ? N  Using the Toilet? N  In the past six months, have you accidently leaked urine? N  Do you have problems with loss of bowel control? N   Managing your Medications? N  Managing your Finances? N  Housekeeping or managing your Housekeeping? N    Patient Care Team: Tommie Sams, DO as PCP - General (Family Medicine) Runell Gess, MD as PCP - Cardiology (Cardiology)  Indicate any recent Medical Services you may have received from other than [facility] providers in the past year (date may be approximate).     Assessment:   This is a routine wellness examination for Connor Sanford.  Hearing/Vision screen Hearing Screening - Comments:: Wears  hearing aids. Up to date with yearly exams  Vision Screening - Comments:: Wears rx glasses - up to date with routine eye exams  Daisy Lazar w/ [facility]   Goals Addressed             This Visit's Progress    Patient Stated       Lose 10 pounds        Depression Screen    02/04/2023    2:10 PM 02/12/2022    2:38 PM 01/19/2022    4:27 PM 07/30/2020   11:07 AM 05/08/2020   10:17 AM 03/19/2020    1:22 PM 12/08/2017    8:42 AM  PHQ 2/9 Scores  PHQ - 2 Score 0 0 0 0 0 0 0  PHQ- 9 Score 0 0 0        Fall Risk    02/04/2023    2:10 PM 02/12/2022    2:38 PM 01/19/2022    4:27 PM 01/18/2022    6:59 PM 12/08/2017    8:42 AM  Fall Risk   Falls in the past year? 0 0 0 0 No  Number falls in past yr: 0 0 0 0   Injury with Fall? 0 0 0 0   Risk for fall due to : No Fall Risks No Fall Risks No Fall Risks    Follow up Falls prevention discussed Falls evaluation completed Falls evaluation completed      MEDICARE RISK AT HOME: Medicare Risk at Home Any stairs in or around the home?: Yes If so, are there any without handrails?: No Home free of loose throw rugs in walkways, pet beds, electrical cords, etc?: Yes Adequate lighting in your home to reduce risk of falls?: Yes Life alert?: No Use of a cane, walker or w/c?: No Grab bars in the bathroom?: Yes Shower chair or bench in shower?: No Elevated toilet seat or a handicapped toilet?: No  TIMED UP AND GO:  Was the test  performed?  No    Cognitive Function:        02/04/2023    2:10 PM  6CIT Screen  What Year? 0 points  What month? 0 points  What time? 0 points  Count back from 20 0 points  Months in reverse 0 points  Repeat phrase 0 points  Total Score 0 points    Immunizations Immunization History  Administered Date(s) Administered   Influenza,inj,Quad PF,6+ Mos 11/30/2016, 12/08/2017   Influenza-Unspecified 11/30/2012, 11/29/2013, 12/18/2014, 12/09/2018, 01/17/2020   Moderna Sars-Covid-2 Vaccination 06/13/2019, 07/13/2019   Tdap 12/09/2010, 08/13/2021   Zoster Recombinant(Shingrix) 12/17/2016, 04/12/2017    TDAP status: Up to date  Flu Vaccine status: Due, Education has been provided regarding the importance of this vaccine. Advised may receive this vaccine at local pharmacy or Health Dept. Aware to provide a copy of the vaccination record if obtained from local pharmacy or Health Dept. Verbalized acceptance and understanding.  Pneumococcal vaccine status: Due, Education has been provided regarding the importance of this vaccine. Advised may receive this vaccine at local pharmacy or Health Dept. Aware to provide a copy of the vaccination record if obtained from local pharmacy or Health Dept. Verbalized acceptance and understanding.  Covid-19 vaccine status: Completed vaccines  Qualifies for Shingles Vaccine? Yes   Zostavax completed No   Shingrix Completed?: Yes  Screening Tests Health Maintenance  Topic Date Due   Pneumonia Vaccine 73+ Years old (1 of 2 - PCV) Never done   FOOT EXAM  Never  done   OPHTHALMOLOGY EXAM  Never done   HEMOGLOBIN A1C  08/17/2022   INFLUENZA VACCINE  09/30/2022   Medicare Annual Wellness (AWV)  01/20/2023   Diabetic kidney evaluation - eGFR measurement  02/16/2023   Diabetic kidney evaluation - Urine ACR  02/16/2023   Colonoscopy  10/07/2024   DTaP/Tdap/Td (3 - Td or Tdap) 08/14/2031   Zoster Vaccines- Shingrix  Completed   HPV VACCINES  Aged Out    COVID-19 Vaccine  Discontinued   Hepatitis C Screening  Discontinued    Health Maintenance  Health Maintenance Due  Topic Date Due   Pneumonia Vaccine 84+ Years old (1 of 2 - PCV) Never done   FOOT EXAM  Never done   OPHTHALMOLOGY EXAM  Never done   HEMOGLOBIN A1C  08/17/2022   INFLUENZA VACCINE  09/30/2022   Medicare Annual Wellness (AWV)  01/20/2023   Diabetic kidney evaluation - eGFR measurement  02/16/2023   Diabetic kidney evaluation - Urine ACR  02/16/2023    Colorectal cancer screening: Type of screening: Colonoscopy. Completed 10/07/2021. Repeat every 3 years  Lung Cancer Screening: (Low Dose CT Chest recommended if Age 49-80 years, 20 pack-year currently smoking OR have quit w/in 15years.) does not qualify.   Lung Cancer Screening Referral: na  Additional Screening:  Hepatitis C Screening: does not qualify; Completed   Vision Screening: Recommended annual ophthalmology exams for early detection of glaucoma and other disorders of the eye. Is the patient up to date with their annual eye exam?  Yes  Who is the provider or what is the name of the office in which the patient attends annual eye exams? Dr. Daisy Lazar [facility] If pt is not established with a provider, would they like to be referred to a provider to establish care? No .   Dental Screening: Recommended annual dental exams for proper oral hygiene  Diabetic Foot Exam: Diabetic Foot Exam: Overdue, Pt has been advised about the importance in completing this exam. Pt is scheduled for diabetic foot exam on provider made aware.  Community Resource Referral / Chronic Care Management: CRR required this visit?  No   CCM required this visit?  No     Plan:     I have personally reviewed and noted the following in the patient's chart:   Medical and social history Use of alcohol, tobacco or illicit drugs  Current medications and supplements including opioid prescriptions. Patient is not currently  taking opioid prescriptions. Functional ability and status Nutritional status Physical activity Advanced directives List of other physicians Hospitalizations, surgeries, and ER visits in previous 12 months Vitals Screenings to include cognitive, depression, and falls Referrals and appointments  In addition, I have reviewed and discussed with patient certain preventive protocols, quality metrics, and best practice recommendations. A written personalized care plan for preventive services as well as general preventive health recommendations were provided to patient.     Jordan Hawks Vaunda Gutterman, CMA   02/04/2023   After Visit Summary: (MyChart) Due to this being a telephonic visit, the after visit summary with patients personalized plan was offered to patient via MyChart   Nurse Notes: see routing comment

## 2023-02-04 NOTE — Patient Instructions (Signed)
Connor Sanford , Thank you for taking time to come for your Medicare Wellness Visit. I appreciate your ongoing commitment to your health goals. Please review the following plan we discussed and let me know if I can assist you in the future.     Referrals/Orders/Follow-Ups/Clinician Recommendations:  Next Medicare Annual Wellness Visit: February 10, 2024 at 1:40 pm virtual visit  You are due for the vaccines checked below. You may have these done at your preferred pharmacy. Please have them fax the office proof of the vaccines so that we can update your chart.   [x]  Flu (due annually)  Recommended this fall either at PCP office or through your local pharmacy. The flu season starts August 1 of each year.   []  Shingrix (Shingles vaccine): CDC recommends 2 doses of Shingrix separated by 2-6 months for aged 42 years and older:  [x]  Pneumonia Vaccines: Recommended for adults 65 years or older  []  TDAP (Tetanus) Vaccine every 10 years:Recommended every 10 years; Please call your insurance company to determine your out of pocket expense. You also receive this vaccine at your local pharmacy or Health Dept.  []  Covid-19: Available now at any [facility] pharmacy (see info below)  You may also get your vaccines at any [facility] (locations listed below.) Vaccine hours are Monday - Friday 9:00 - 4:00. No appointments are required. Most insurances are accepted including Medicaid. Anyone can use the community pharmacies, and people are not required to have a [facility] provider.  Community Pharmacy Locations offering vaccines:   Sport and exercise psychologist   [facility]  10 vaccines are offered at the [employer]: Covid, flu, Tdap, shingles, RSV, pneumonia, meningococcal, hepatitis A, hepatitis B, and HPV.    This is a list of the screening recommended for you and due dates:  Health  Maintenance  Topic Date Due   Pneumonia Vaccine (1 of 2 - PCV) Never done   Complete foot exam   Never done   Eye exam for diabetics  Never done   Hemoglobin A1C  08/17/2022   Flu Shot  09/30/2022   Yearly kidney function blood test for diabetes  02/16/2023   Yearly kidney health urinalysis for diabetes  02/16/2023   Medicare Annual Wellness Visit  02/04/2024   Colon Cancer Screening  10/07/2024   DTaP/Tdap/Td vaccine (3 - Td or Tdap) 08/14/2031   Zoster (Shingles) Vaccine  Completed   HPV Vaccine  Aged Out   COVID-19 Vaccine  Discontinued   Hepatitis C Screening  Discontinued    Advanced directives: (ACP Link)Information on Advanced Care Planning can be found at [facility] Advance Health Care Directives Advance Health Care Directives ([email or web address]   Next Medicare Annual Wellness Visit scheduled for next year: Yes  Preventive Care 65 Years and Older, Male Preventive care refers to lifestyle choices and visits with your health care provider that can promote health and wellness. Preventive care visits are also called wellness exams. What can I expect for my preventive care visit? Counseling During your preventive care visit, your health care provider may ask about your: Medical history, including: Past medical problems. Family medical history. History of falls. Current health, including: Emotional well-being. Home life and relationship well-being. Sexual activity. Memory and ability to understand (cognition). Lifestyle, including: Alcohol, nicotine or tobacco, and drug use. Access to firearms. Diet, exercise, and sleep habits. Work and work Astronomer.  Sunscreen use. Safety issues such as seatbelt and bike helmet use. Physical exam Your health care provider will check your: Height and weight. These may be used to calculate your BMI (body mass index). BMI is a measurement that tells if you are at a healthy weight. Waist circumference. This measures the  distance around your waistline. This measurement also tells if you are at a healthy weight and may help predict your risk of certain diseases, such as type 2 diabetes and high blood pressure. Heart rate and blood pressure. Body temperature. Skin for abnormal spots. What immunizations do I need?  Vaccines are usually given at various ages, according to a schedule. Your health care provider will recommend vaccines for you based on your age, medical history, and lifestyle or other factors, such as travel or where you work. What tests do I need? Screening Your health care provider may recommend screening tests for certain conditions. This may include: Lipid and cholesterol levels. Diabetes screening. This is done by checking your blood sugar (glucose) after you have not eaten for a while (fasting). Hepatitis C test. Hepatitis B test. HIV (human immunodeficiency virus) test. STI (sexually transmitted infection) testing, if you are at risk. Lung cancer screening. Colorectal cancer screening. Prostate cancer screening. Abdominal aortic aneurysm (AAA) screening. You may need this if you are a current or former smoker. Talk with your health care provider about your test results, treatment options, and if necessary, the need for more tests. Follow these instructions at home: Eating and drinking  Eat a diet that includes fresh fruits and vegetables, whole grains, lean protein, and low-fat dairy products. Limit your intake of foods with high amounts of sugar, saturated fats, and salt. Take vitamin and mineral supplements as recommended by your health care provider. Do not drink alcohol if your health care provider tells you not to drink. If you drink alcohol: Limit how much you have to 0-2 drinks a day. Know how much alcohol is in your drink. In the [place] one drink equals one 12 oz bottle of beer (355 mL), one 5 oz glass of wine (148 mL), or one 1 oz glass of hard liquor (44 mL). Lifestyle Brush  your teeth every morning and night with fluoride toothpaste. Floss one time each day. Exercise for at least 30 minutes 5 or more days each week. Do not use any products that contain nicotine or tobacco. These products include cigarettes, chewing tobacco, and vaping devices, such as e-cigarettes. If you need help quitting, ask your health care provider. Do not use drugs. If you are sexually active, practice safe sex. Use a condom or other form of protection to prevent STIs. Take aspirin only as told by your health care provider. Make sure that you understand how much to take and what form to take. Work with your health care provider to find out whether it is safe and beneficial for you to take aspirin daily. Ask your health care provider if you need to take a cholesterol-lowering medicine (statin). Find healthy ways to manage stress, such as: Meditation, yoga, or listening to music. Journaling. Talking to a trusted person. Spending time with friends and family. Safety Always wear your seat belt while driving or riding in a vehicle. Do not drive: If you have been drinking alcohol. Do not ride with someone who has been drinking. When you are tired or distracted. While texting. If you have been using any mind-altering substances or drugs. Wear a helmet and other protective equipment during sports activities.  If you have firearms in your house, make sure you follow all gun safety procedures. Minimize exposure to UV radiation to reduce your risk of skin cancer. What's next? Visit your health care provider once a year for an annual wellness visit. Ask your health care provider how often you should have your eyes and teeth checked. Stay up to date on all vaccines. This information is not intended to replace advice given to you by your health care provider. Make sure you discuss any questions you have with your health care provider. Document Revised: 08/13/2020 Document Reviewed:  08/13/2020 Elsevier Patient Education  2024 [employer]. Understanding Your Risk for Falls Millions of people have serious injuries from falls each year. It is important to understand your risk of falling. Talk with your health care provider about your risk and what you can do to lower it. If you do have a serious fall, make sure to tell your provider. Falling once raises your risk of falling again. How can falls affect me? Serious injuries from falls are common. These include: Broken bones, such as hip fractures. Head injuries, such as traumatic brain injuries (TBI) or concussions. A fear of falling can cause you to avoid activities and stay at home. This can make your muscles weaker and raise your risk for a fall. What can increase my risk? There are a number of risk factors that increase your risk for falling. The more risk factors you have, the higher your risk of falling. Serious injuries from a fall happen most often to people who are older than 66 years old. Teenagers and young adults ages 70-29 are also at higher risk. Common risk factors include: Weakness in the lower body. Being generally weak or confused due to long-term (chronic) illness. Dizziness or balance problems. Poor vision. Medicines that cause dizziness or drowsiness. These may include: Medicines for your blood pressure, heart, anxiety, insomnia, or swelling (edema). Pain medicines. Muscle relaxants. Other risk factors include: Drinking alcohol. Having had a fall in the past. Having foot pain or wearing improper footwear. Working at a dangerous job. Having any of the following in your home: Tripping hazards, such as floor clutter or loose rugs. Poor lighting. Pets. Having dementia or memory loss. What actions can I take to lower my risk of falling?     Physical activity Stay physically fit. Do strength and balance exercises. Consider taking a regular class to build strength and balance. Yoga and tai chi are  good options. Vision Have your eyes checked every year and your prescription for glasses or contacts updated as needed. Shoes and walking aids Wear non-skid shoes. Wear shoes that have rubber soles and low heels. Do not wear high heels. Do not walk around the house in socks or slippers. Use a cane or walker as told by your provider. Home safety Attach secure railings on both sides of your stairs. Install grab bars for your bathtub, shower, and toilet. Use a non-skid mat in your bathtub or shower. Attach bath mats securely with double-sided, non-slip rug tape. Use good lighting in all rooms. Keep a flashlight near your bed. Make sure there is a clear path from your bed to the bathroom. Use night-lights. Do not use throw rugs. Make sure all carpeting is taped or tacked down securely. Remove all clutter from walkways and stairways, including extension cords. Repair uneven or broken steps and floors. Avoid walking on icy or slippery surfaces. Walk on the grass instead of on icy or slick sidewalks. Use ice  melter to get rid of ice on walkways in the winter. Use a cordless phone. Questions to ask your health care provider Can you help me check my risk for a fall? Do any of my medicines make me more likely to fall? Should I take a vitamin D supplement? What exercises can I do to improve my strength and balance? Should I make an appointment to have my vision checked? Do I need a bone density test to check for weak bones (osteoporosis)? Would it help to use a cane or a walker? Where to find more information Centers for Disease Control and Prevention, STEADI: TonerPromos.no Community-Based Fall Prevention Programs: TonerPromos.no [employer] on Aging: BaseRingTones.pl Contact a health care provider if: You fall at home. You are afraid of falling at home. You feel weak, drowsy, or dizzy. This information is not intended to replace advice given to you by your health care provider. Make sure you discuss  any questions you have with your health care provider. Document Revised: 10/19/2021 Document Reviewed: 10/19/2021 Elsevier Patient Education  2024 [employer].

## 2023-02-09 ENCOUNTER — Other Ambulatory Visit: Payer: Self-pay | Admitting: Cardiovascular Disease

## 2023-02-09 ENCOUNTER — Other Ambulatory Visit: Payer: Self-pay | Admitting: Family Medicine

## 2023-02-09 DIAGNOSIS — I2510 Atherosclerotic heart disease of native coronary artery without angina pectoris: Secondary | ICD-10-CM

## 2023-03-03 DIAGNOSIS — I2510 Atherosclerotic heart disease of native coronary artery without angina pectoris: Secondary | ICD-10-CM | POA: Diagnosis not present

## 2023-03-03 DIAGNOSIS — E785 Hyperlipidemia, unspecified: Secondary | ICD-10-CM | POA: Diagnosis not present

## 2023-03-03 DIAGNOSIS — E119 Type 2 diabetes mellitus without complications: Secondary | ICD-10-CM | POA: Diagnosis not present

## 2023-03-04 LAB — CBC WITH DIFFERENTIAL/PLATELET
Basophils Absolute: 0 10*3/uL (ref 0.0–0.2)
Basos: 1 %
EOS (ABSOLUTE): 0.3 10*3/uL (ref 0.0–0.4)
Eos: 4 %
Hematocrit: 43.1 % (ref 37.5–51.0)
Hemoglobin: 14.6 g/dL (ref 13.0–17.7)
Immature Grans (Abs): 0 10*3/uL (ref 0.0–0.1)
Immature Granulocytes: 0 %
Lymphocytes Absolute: 3 10*3/uL (ref 0.7–3.1)
Lymphs: 41 %
MCH: 33.2 pg — ABNORMAL HIGH (ref 26.6–33.0)
MCHC: 33.9 g/dL (ref 31.5–35.7)
MCV: 98 fL — ABNORMAL HIGH (ref 79–97)
Monocytes Absolute: 0.6 10*3/uL (ref 0.1–0.9)
Monocytes: 9 %
Neutrophils Absolute: 3.2 10*3/uL (ref 1.4–7.0)
Neutrophils: 45 %
Platelets: 213 10*3/uL (ref 150–450)
RBC: 4.4 x10E6/uL (ref 4.14–5.80)
RDW: 12.3 % (ref 11.6–15.4)
WBC: 7.2 10*3/uL (ref 3.4–10.8)

## 2023-03-04 LAB — COMPREHENSIVE METABOLIC PANEL
ALT: 21 [IU]/L (ref 0–44)
AST: 25 [IU]/L (ref 0–40)
Albumin: 4.8 g/dL (ref 3.9–4.9)
Alkaline Phosphatase: 99 [IU]/L (ref 44–121)
BUN/Creatinine Ratio: 21 (ref 10–24)
BUN: 23 mg/dL (ref 8–27)
Bilirubin Total: 0.8 mg/dL (ref 0.0–1.2)
CO2: 23 mmol/L (ref 20–29)
Calcium: 9.5 mg/dL (ref 8.6–10.2)
Chloride: 102 mmol/L (ref 96–106)
Creatinine, Ser: 1.08 mg/dL (ref 0.76–1.27)
Globulin, Total: 1.8 g/dL (ref 1.5–4.5)
Glucose: 104 mg/dL — ABNORMAL HIGH (ref 70–99)
Potassium: 4.7 mmol/L (ref 3.5–5.2)
Sodium: 139 mmol/L (ref 134–144)
Total Protein: 6.6 g/dL (ref 6.0–8.5)
eGFR: 76 mL/min/{1.73_m2} (ref >59)

## 2023-03-04 LAB — HEMOGLOBIN A1C
Est. average glucose Bld gHb Est-mCnc: 137 mg/dL
Hgb A1c MFr Bld: 6.4 % — ABNORMAL HIGH (ref 4.8–5.6)

## 2023-03-04 LAB — MICROALBUMIN / CREATININE URINE RATIO
Creatinine, Urine: 75.8 mg/dL
Microalb/Creat Ratio: <4 mg/g{creat} (ref 0–29)
Microalbumin, Urine: <3 ug/mL

## 2023-03-04 LAB — PSA: Prostate Specific Ag, Serum: 0.7 ng/mL (ref 0.0–4.0)

## 2023-03-04 LAB — LIPID PANEL
Chol/HDL Ratio: 2.7 {ratio} (ref 0.0–5.0)
Cholesterol, Total: 110 mg/dL (ref 100–199)
HDL: 41 mg/dL (ref >39)
LDL Chol Calc (NIH): 56 mg/dL (ref 0–99)
Triglycerides: 61 mg/dL (ref 0–149)
VLDL Cholesterol Cal: 13 mg/dL (ref 5–40)

## 2023-03-08 ENCOUNTER — Ambulatory Visit (INDEPENDENT_AMBULATORY_CARE_PROVIDER_SITE_OTHER): Payer: Medicare Other | Admitting: Family Medicine

## 2023-03-08 VITALS — BP 121/75 | Ht 70.0 in | Wt 219.4 lb

## 2023-03-08 DIAGNOSIS — Z0000 Encounter for general adult medical examination without abnormal findings: Secondary | ICD-10-CM

## 2023-03-08 DIAGNOSIS — Z23 Encounter for immunization: Secondary | ICD-10-CM

## 2023-03-08 MED ORDER — CLOBETASOL PROPIONATE 0.05 % EX OINT: 1.0000 | TOPICAL_OINTMENT | Freq: Two times a day (BID) | CUTANEOUS | 0 refills | Status: AC | PRN

## 2023-03-08 MED ORDER — NITROGLYCERIN 0.4 MG SL SUBL
0.4000 mg | SUBLINGUAL_TABLET | SUBLINGUAL | 0 refills | Status: DC | PRN
Start: 2023-03-08 — End: 2023-06-28

## 2023-03-08 NOTE — Patient Instructions (Signed)
Continue your medication.  Follow-up in 6 months  Take care  Dr. Cook  

## 2023-03-09 NOTE — Assessment & Plan Note (Signed)
 Patient is doing well at this time.  Preventative care updated and pneumococcal and flu vaccines were given today.  Nitroglycerin was refilled.  Patient requested clobetasol and this was sent in as well.  Follow-up in 6 months.

## 2023-03-09 NOTE — Progress Notes (Signed)
 Subjective:  Patient ID: Connor Sanford, male    DOB: 16-May-1955  Age: 68 y.o. MRN: [id number]  CC:   Chief Complaint  Patient presents with   Annual Exam    Had wellness 01/2023    HPI:  68 year old male with the below mentioned medical problems presents for an annual physical exam.  Patient states that overall he is doing well.  No chest pain or shortness of breath.  He is in need of pneumococcal vaccine as well as flu vaccine.  He is amenable to these today.  He has had recent labs and I reviewed them with him today.  Lipids at goal.  A1c at goal.  Patient states that he needs a refill on his nitroglycerin .  He states that his tablets recently got wet.  Patient reports that occasionally he gets shaking rash between his inner thighs.  Response to clobetasol .  He would like a prescription for this.  Patient Active Problem List   Diagnosis Date Noted   Type 2 diabetes mellitus without complication, without long-term current use of insulin (HCC) 02/12/2022   Chronic sinusitis 02/12/2022   Annual physical exam 08/14/2021   Occipital headache 07/28/2021   Anxiety 07/28/2021   Hyperlipidemia 03/04/2021   CAD (coronary artery disease) 01/03/2020   Hx of repair of left rotator cuff 12/27/2013    Social Hx   Social History   Socioeconomic History   Marital status: Married    Spouse name: Not on file   Number of children: Not on file   Years of education: Not on file   Highest education level: Not on file  Occupational History   Not on file  Tobacco Use   Smoking status: Never   Smokeless tobacco: Never  Vaping Use   Vaping status: Never Used  Substance and Sexual Activity   Alcohol use: No   Drug use: No   Sexual activity: Yes    Birth control/protection: None  Other Topics Concern   Not on file  Social History Narrative   Not on file   Social Drivers of Health   Financial Resource Strain: Low Risk  (02/04/2023)   Overall Financial Resource Strain (CARDIA)     Difficulty of Paying Living Expenses: Not hard at all  Food Insecurity: No Food Insecurity (02/04/2023)   Hunger Vital Sign    Worried About Running Out of Food in the Last Year: Never true    Ran Out of Food in the Last Year: Never true  Transportation Needs: No Transportation Needs (02/04/2023)   PRAPARE - Administrator, Civil Service (Medical): No    Lack of Transportation (Non-Medical): No  Physical Activity: Sufficiently Active (02/04/2023)   Exercise Vital Sign    Days of Exercise per Week: 7 days    Minutes of Exercise per Session: 30 min  Stress: No Stress Concern Present (02/04/2023)   [employer] of Occupational Health - Occupational Stress Questionnaire    Feeling of Stress : Not at all  Social Connections: Socially Integrated (02/04/2023)   Social Connection and Isolation Panel [NHANES]    Frequency of Communication with Friends and Family: More than three times a week    Frequency of Social Gatherings with Friends and Family: More than three times a week    Attends Religious Services: More than 4 times per year    Active Member of [employer] or Organizations: Yes    Attends Banker Meetings: More than 4 times per year  Marital Status: Married    Review of Systems Per HPI  Objective:  BP 121/75   Ht 5' 10 (1.778 m)   Wt 219 lb 6.4 oz (99.5 kg)   BMI 31.48 kg/m      03/08/2023    2:28 PM 02/04/2023    2:06 PM 04/09/2022    3:04 PM  BP/Weight  Systolic BP 121 -- 108  Diastolic BP 75 -- 62  Wt. (Lbs) 219.4 215 214.8  BMI 31.48 kg/m2 30.85 kg/m2 30.82 kg/m2    Physical Exam Vitals and nursing note reviewed.  Constitutional:      General: He is not in acute distress.    Appearance: Normal appearance.  HENT:     Head: Normocephalic and atraumatic.  Eyes:     General:        Right eye: No discharge.        Left eye: No discharge.     Conjunctiva/sclera: Conjunctivae normal.  Cardiovascular:     Rate and Rhythm: Normal rate and  regular rhythm.  Pulmonary:     Effort: Pulmonary effort is normal.     Breath sounds: Normal breath sounds. No wheezing, rhonchi or rales.  Neurological:     Mental Status: He is alert.  Psychiatric:        Mood and Affect: Mood normal.        Behavior: Behavior normal.     Assessment & Plan:   Problem List Items Addressed This Visit       Other   Annual physical exam - Primary   Patient is doing well at this time.  Preventative care updated and pneumococcal and flu vaccines were given today.  Nitroglycerin  was refilled.  Patient requested clobetasol  and this was sent in as well.  Follow-up in 6 months.      Other Visit Diagnoses       Immunization due       Relevant Orders   Pneumococcal conjugate vaccine 20-valent (Prevnar 20) (Completed)   Flu Vaccine Trivalent High Dose (Fluad) (Completed)       Meds ordered this encounter  Medications   nitroGLYCERIN  (NITROSTAT ) 0.4 MG SL tablet    Sig: Place 1 tablet (0.4 mg total) under the tongue every 5 (five) minutes as needed for chest pain.    Dispense:  25 tablet    Refill:  0   clobetasol  ointment (TEMOVATE ) 0.05 %    Sig: Apply 1 Application topically 2 (two) times daily as needed.    Dispense:  60 g    Refill:  0    Follow-up:  No follow-ups on file.  Jacqulyn Ahle DO [facility]

## 2023-03-28 ENCOUNTER — Telehealth: Payer: Self-pay

## 2023-03-28 ENCOUNTER — Encounter: Payer: Self-pay | Admitting: Physician Assistant

## 2023-03-28 ENCOUNTER — Ambulatory Visit (INDEPENDENT_AMBULATORY_CARE_PROVIDER_SITE_OTHER): Payer: Medicare Other | Admitting: Physician Assistant

## 2023-03-28 VITALS — BP 128/86 | Temp 98.4°F | Ht 70.0 in | Wt 219.0 lb

## 2023-03-28 DIAGNOSIS — J208 Acute bronchitis due to other specified organisms: Secondary | ICD-10-CM

## 2023-03-28 MED ORDER — ALBUTEROL SULFATE HFA 108 (90 BASE) MCG/ACT IN AERS: 2.0000 | INHALATION_SPRAY | Freq: Four times a day (QID) | RESPIRATORY_TRACT | 0 refills | Status: DC | PRN

## 2023-03-28 MED ORDER — PROMETHAZINE-DM 6.25-15 MG/5ML PO SYRP: 5.0000 mL | ORAL_SOLUTION | Freq: Four times a day (QID) | ORAL | 0 refills | Status: DC | PRN

## 2023-03-28 MED ORDER — PREDNISONE 20 MG PO TABS: ORAL_TABLET | ORAL | 0 refills | Status: AC

## 2023-03-28 NOTE — Progress Notes (Signed)
Acute Office Visit  Subjective:     Patient ID: Connor Sanford, male    DOB: 10/30/1955, 68 y.o.   MRN: [id number]   HPI Patient is in today for Upper Respiratory Infection: Patient complains of symptoms of a URI. Symptoms include congestion. Onset of symptoms was 3 days ago, gradually improving since that time. He also c/o non productive cough with minor chest tightness for the past 3 days . He denies fevers. He is drinking plenty of fluids. Evaluation to date: none. Treatment to date: decongestants.    Review of Systems  Constitutional:  Negative for chills, fever and malaise/fatigue.  HENT:  Positive for congestion. Negative for ear pain and sore throat.   Respiratory:  Positive for cough. Negative for shortness of breath.   Cardiovascular:  Negative for chest pain and palpitations.  Musculoskeletal:  Negative for myalgias.  Neurological:  Negative for headaches.        Objective:     BP 128/86   Temp 98.4 F (36.9 C) (Oral)   Ht 5\' 10"  (1.778 m)   Wt 219 lb (99.3 kg)   BMI 31.42 kg/m   Physical Exam Vitals reviewed.  Constitutional:      General: He is not in acute distress.    Appearance: Normal appearance.  HENT:     Right Ear: Tympanic membrane normal.     Left Ear: Tympanic membrane normal.     Nose: Congestion present.     Mouth/Throat:     Mouth: Mucous membranes are moist.     Pharynx: Oropharynx is clear.  Eyes:     Extraocular Movements: Extraocular movements intact.     Conjunctiva/sclera: Conjunctivae normal.  Cardiovascular:     Rate and Rhythm: Normal rate and regular rhythm.     Heart sounds: No murmur heard.    No friction rub. No gallop.  Pulmonary:     Effort: Pulmonary effort is normal.     Breath sounds: No stridor. Wheezing (minimal) present. No rhonchi or rales.  Musculoskeletal:        General: Normal range of motion.  Lymphadenopathy:     Cervical: No cervical adenopathy.  Skin:    General: Skin is warm and dry.      Capillary Refill: Capillary refill takes less than 2 seconds.  Neurological:     General: No focal deficit present.     Mental Status: He is alert and oriented to person, place, and time.  Psychiatric:        Mood and Affect: Mood normal.        Behavior: Behavior normal.     No results found for any visits on 03/28/23.      Assessment & Plan:  Acute viral bronchitis -     predniSONE; Take 2 tablets (40 mg total) by mouth daily for 4 days, THEN 1 tablet (20 mg total) daily for 4 days, THEN 0.5 tablets (10 mg total) daily for 4 days.  Dispense: 14 tablet; Refill: 0 -     Albuterol Sulfate HFA; Inhale 2 puffs into the lungs every 6 (six) hours as needed for wheezing or shortness of breath.  Dispense: 8.5 g; Refill: 0 -     Promethazine-DM; Take 5 mLs by mouth 4 (four) times daily as needed for cough.  Dispense: 118 mL; Refill: 0  Patient appears stable today. Rather benign exam, minimal wheezing heard bilaterally. Likely self-resolving viral infection. Supportive care reviewed with patient. Prednisone, cough syrup, and albuterol inhaler for chest  tightness and cough. Discussed with patient that there are no indications for antibiotics at this time, and viral respiratory illness can be persistent in duration.Tylenol for pain or fever as needed.  May continue with OTC cold medications. Patient instructed to return to clinic if worsening shortness of breath, chest pain, hypoxia, or other concerns. Patient agreeable to plan.     Return if symptoms worsen or fail to improve.  Toni Amend Chrles Selley, PA-C

## 2023-03-28 NOTE — Telephone Encounter (Signed)
Reason for CRM: Wanted to get in today but no openings, needs possible antibiotic for cough and congestion, worried about it turning into bronchitis please call [phone]   Called pt and schedule him appt with Courney Grooms PA for today @3 :20

## 2023-04-18 ENCOUNTER — Other Ambulatory Visit: Payer: Self-pay | Admitting: Family Medicine

## 2023-05-17 ENCOUNTER — Other Ambulatory Visit: Payer: Self-pay | Admitting: Cardiovascular Disease

## 2023-05-17 DIAGNOSIS — I2510 Atherosclerotic heart disease of native coronary artery without angina pectoris: Secondary | ICD-10-CM

## 2023-05-19 ENCOUNTER — Other Ambulatory Visit: Payer: Self-pay | Admitting: Family Medicine

## 2023-05-19 DIAGNOSIS — E119 Type 2 diabetes mellitus without complications: Secondary | ICD-10-CM

## 2023-06-21 ENCOUNTER — Other Ambulatory Visit: Payer: Self-pay | Admitting: Cardiovascular Disease

## 2023-06-21 DIAGNOSIS — I2510 Atherosclerotic heart disease of native coronary artery without angina pectoris: Secondary | ICD-10-CM

## 2023-06-25 IMAGING — US US SCROTUM W/ DOPPLER COMPLETE
1 series · 13 of 25 positions shown · non-contrast
Comparison: None.

CLINICAL DATA: Right testicle pain

EXAM:
SCROTAL ULTRASOUND
DOPPLER ULTRASOUND OF THE TESTICLES
TECHNIQUE: Complete ultrasound examination of the testicles, epididymis, and
other scrotal structures was performed. Color and spectral Doppler
ultrasound were also utilized to evaluate blood flow to the
testicles.

[Series 1: us scrotum w/doppler · 13 of 96 slices shown]
[im 1/96]
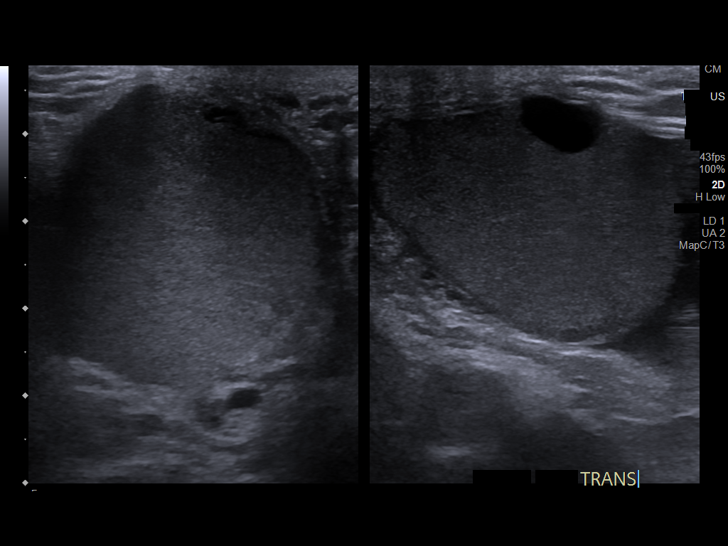
[im 8/96]
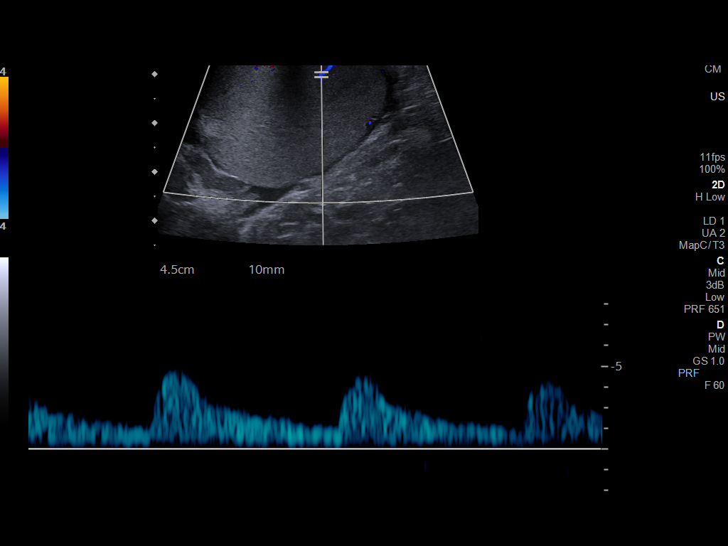
[im 16/96]
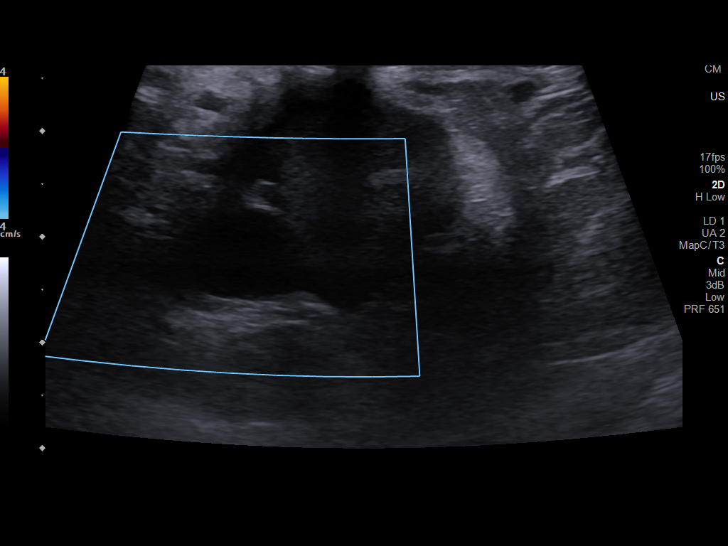
[im 24/96]
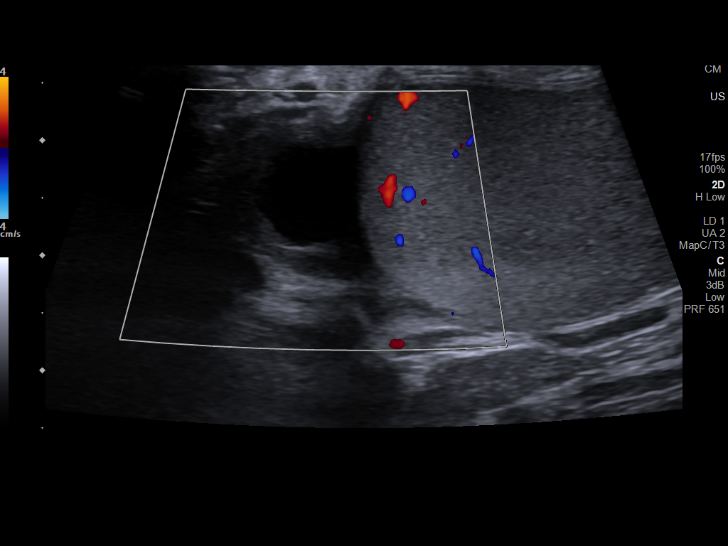
[im 32/96]
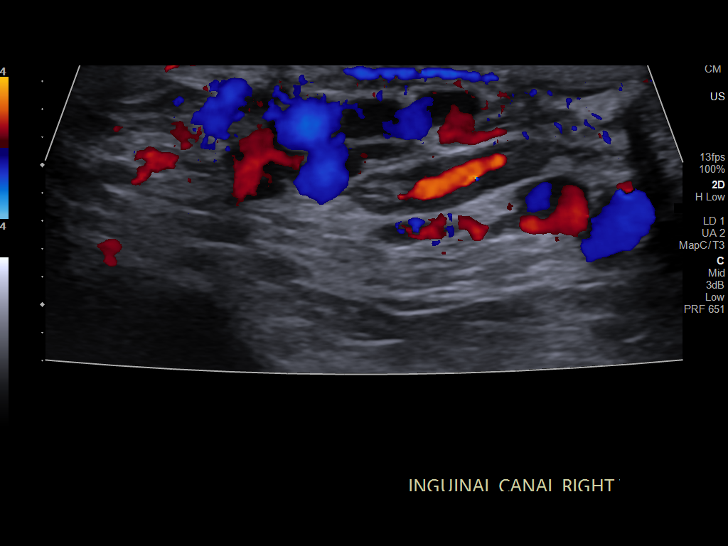
[im 40/96]
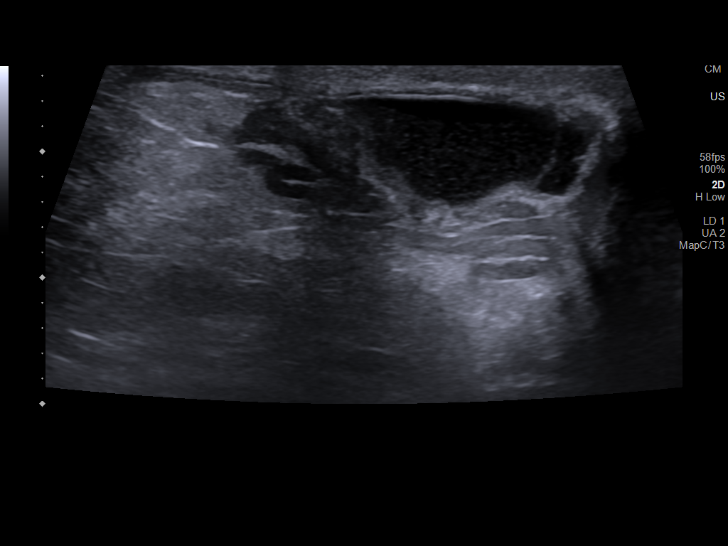
[im 48/96]
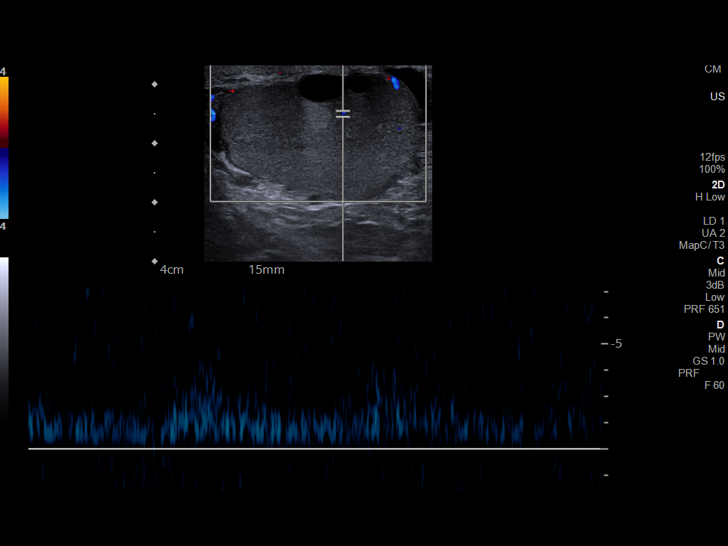
[im 56/96]
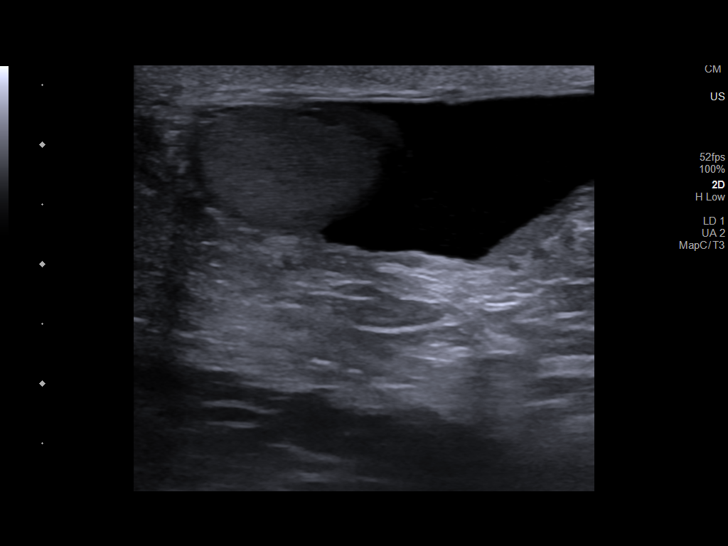
[im 64/96]
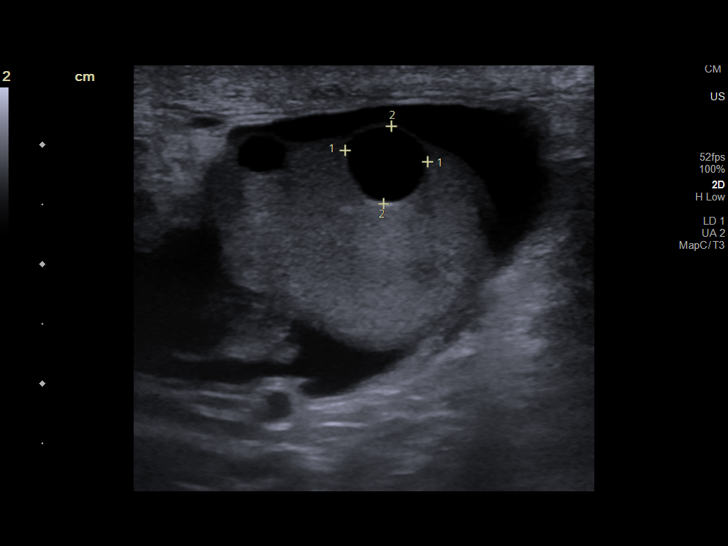
[im 72/96]
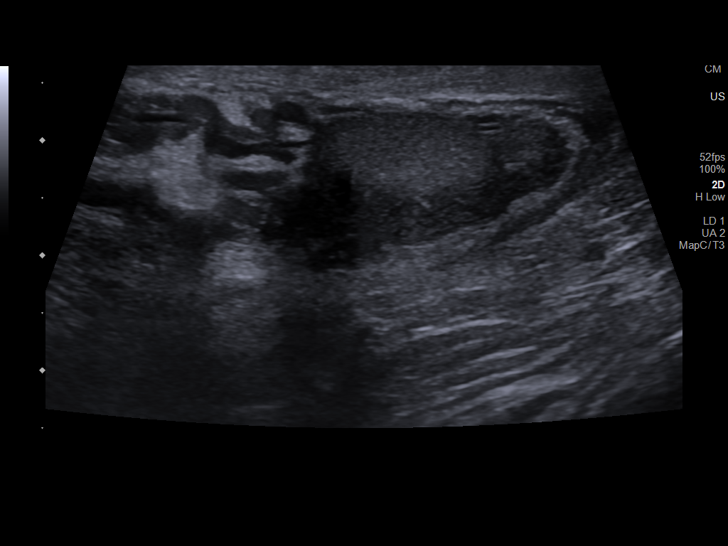
[im 80/96]
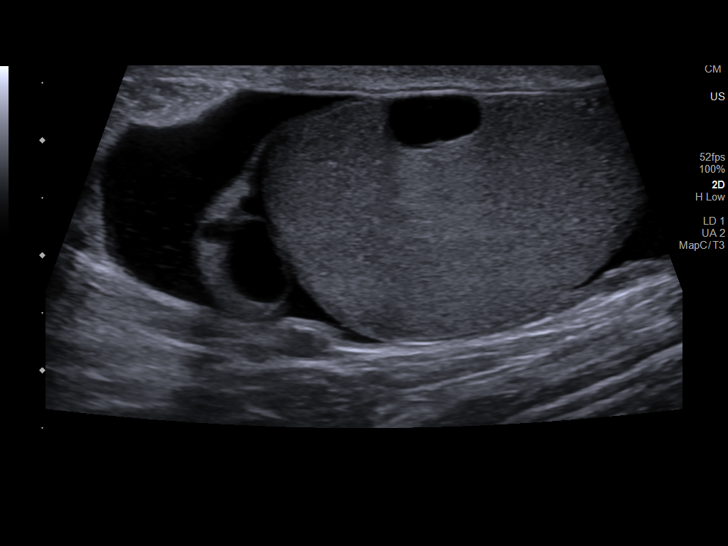
[im 88/96]
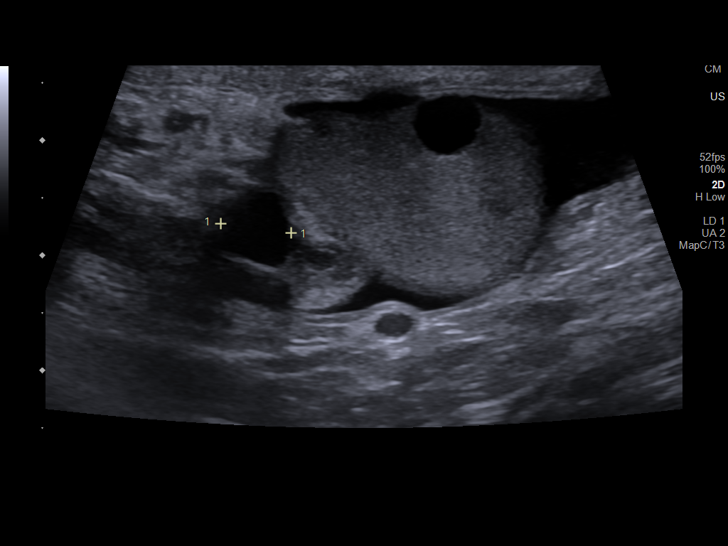
[im 96/96]
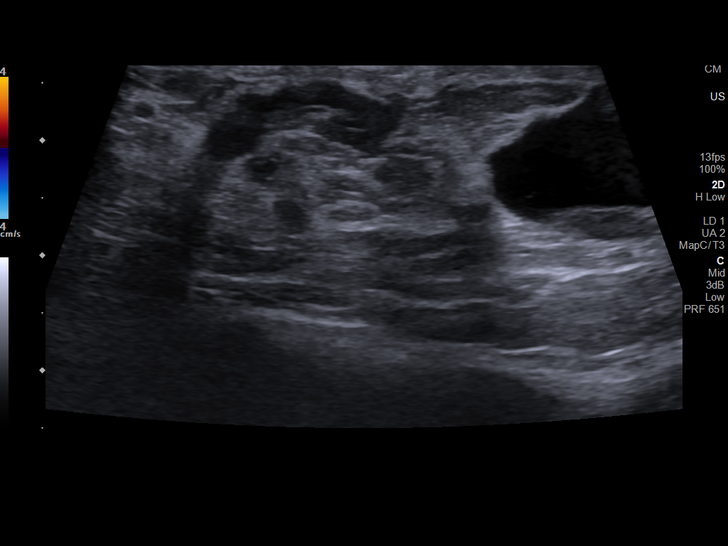

[13 of 25 positions shown; findings below may reference images not displayed]

FINDINGS: Right testicle

Measurements: 4.9 x 1.9 x 3 cm. No mass or microlithiasis
visualized.

Left testicle

Measurements: 4.3 x 2.1 x 3.2 cm. A few anechoic well-defined
testicular cysts identified measuring up to 7 x 7 mm in size. No
suspicious mass or microlithiasis visualized.

Right epididymis:  1.1 cm epididymal head cyst.

Left epididymis: Small epididymal head cysts measuring up to 0.7 cm.

Hydrocele: Small left hydrocele with floating low-level internal
echoes.

Varicocele:  None visualized.

Pulsed Doppler interrogation of both testes demonstrates normal low
resistance arterial and venous waveforms bilaterally.
IMPRESSION: 1. No testicular torsion or suspicious mass identified.
2. Simple appearing left testicular cysts and small bilateral
epididymal head cysts.
3. Small left hydrocele with low-level internal echoes.

## 2023-06-28 ENCOUNTER — Ambulatory Visit: Attending: Cardiovascular Disease | Admitting: Cardiovascular Disease

## 2023-06-28 ENCOUNTER — Encounter: Payer: Self-pay | Admitting: Cardiovascular Disease

## 2023-06-28 VITALS — BP 124/62 | HR 58 | Ht 70.0 in | Wt 219.8 lb

## 2023-06-28 DIAGNOSIS — E785 Hyperlipidemia, unspecified: Secondary | ICD-10-CM

## 2023-06-28 DIAGNOSIS — I2510 Atherosclerotic heart disease of native coronary artery without angina pectoris: Secondary | ICD-10-CM | POA: Diagnosis not present

## 2023-06-28 MED ORDER — NITROGLYCERIN 0.4 MG SL SUBL: 0.4000 mg | SUBLINGUAL_TABLET | SUBLINGUAL | 3 refills | Status: AC | PRN

## 2023-06-28 MED ORDER — CLOPIDOGREL BISULFATE 75 MG PO TABS: 75.0000 mg | ORAL_TABLET | Freq: Every day | ORAL | 3 refills | Status: AC

## 2023-06-28 NOTE — Assessment & Plan Note (Signed)
 History of CAD status post bare-metal stenting of a large second diagonal branch that was occluded by myself 01/27/2009 in the setting of myocardial infarction.  His EF at that time was 45 to 50%.  His EF ultimately normalized.  He did have scar on Myoview  stress testing.  I recath him because of exertional chest pain and an abnormal Myoview  stress test 11//21 revealing a patent diagonal branch with a 95% proximal AV groove circumflex which I stented using a Synergy 3 mm x 16 mm long drug-eluting stent postdilated to 3.3 mm.  His symptoms resolved.  He remains on dual antiplatelet therapy.  He denies chest pain or shortness of breath.

## 2023-06-28 NOTE — Assessment & Plan Note (Signed)
 History of hyperlipidemia on high dose atorvastatin  with lipid profile performed 03/03/2023 revealing total cholesterol 110, LDL 56 and HDL 41.

## 2023-06-28 NOTE — Progress Notes (Signed)
 06/28/2023 KAZIMIERZ RODI   Feb 04, 1956  [id number]  Primary Physician Myrna Ast, DO Primary Cardiologist: Avanell Leigh MD Lathan Poke, [place], [place]  HPI:  Connor Sanford is a 68 y.o.  mildly overweight married Caucasian male, father of 2, who retired on Jun 29 2012 from working at the [employer]. I last saw him in the office 04/09/2022. He has a history of hyperlipidemia as well as a strong family history of heart disease and a father who had bypass surgery age 18. 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 He did have moderate inferolateral hypokinesia with an EF of 45% to 50%, with a peak CPK of 1000 and MB of 143. Echocardiogram performed April 06, 2009, showed normal LV systolic function without segmental wall motion abnormalities, and Myoview  showed inferoapical scar without ischemia. He did participate in cardiac .  He did fall down in January and injured his left shoulder and now requiring rotator cuff repair. A Myoview  stress test performed on 05/23/13 for preoperative clearance revealed a small area of apical scar with preserved ejection fraction.   He had  developed dyspnea on exertion and predictable exertional chest pain.  He saw Lawana Pray, FNP in the office who ordered a Myoview  stress test on 12/28/2019 there is remarkable for inferolateral ischemia.  Because of this he was referred for diagnostic coronary angiography which I performed on 01/03/2020. This showed a patent diagonal branch stent with a 95% proximal AV groove circumflex stenosis which I stented using a synergy 3 mm x 60 mm long drug-eluting stent postdilated to 3.3 mm. His symptoms have subsequently completely resolved.  Since I saw him a year ago he continues to do well.  He denies chest pain or shortness of breath..   Current Meds  Medication Sig   aspirin  EC 81 MG tablet Take 81 mg  by mouth every evening.   atorvastatin  (LIPITOR) 80 MG tablet TAKE ONE TABLET (80MG  TOTAL) BY MOUTH DAILY   cetirizine (ZYRTEC) 10 MG tablet Take 10 mg by mouth daily as needed (allergies).   clobetasol  ointment (TEMOVATE ) 0.05 % Apply 1 Application topically 2 (two) times daily as needed.   Coenzyme Q10 (COQ10) 100 MG CAPS Take 100 mg by mouth daily.   fluticasone  (FLONASE ) 50 MCG/ACT nasal spray 2 sprays each nare daily (Patient taking differently: Place 1 spray into both nostrils daily as needed for allergies.)   Menthol, Topical Analgesic, (BIOFREEZE) 4 % GEL Apply 1 application topically daily as needed (pain).   metFORMIN  (GLUCOPHAGE ) 500 MG tablet TAKE ONE TABLET (500MG  TOTAL) BY MOUTH TWO TIMES DAILY WITH A MEAL   metoprolol  tartrate (LOPRESSOR ) 25 MG tablet TAKE ONE-HALF TABLET (12.5MG  TOTAL) BY MOUTH TWO TIMES DAILY   Multiple Vitamins-Minerals (MULTIVITAMIN WITH IRON-MINERALS) liquid Take 30 mLs by mouth daily.   [DISCONTINUED] clopidogrel  (PLAVIX ) 75 MG tablet TAKE ONE (1) TABLET BY MOUTH EVERY DAY   [DISCONTINUED] nitroGLYCERIN  (NITROSTAT ) 0.4 MG SL tablet Place 1 tablet (0.4 mg total) under the tongue every 5 (five) minutes as needed for chest pain.     No Known Allergies  Social History   Socioeconomic History   Marital status: Married    Spouse name: Not on file   Number of children: Not on file   Years of education: Not on file   Highest education level: Not on file  Occupational History   Not  on file  Tobacco Use   Smoking status: Never   Smokeless tobacco: Never  Vaping Use   Vaping status: Never Used  Substance and Sexual Activity   Alcohol use: No   Drug use: No   Sexual activity: Yes    Birth control/protection: None  Other Topics Concern   Not on file  Social History Narrative   Not on file   Social Drivers of Health   Financial Resource Strain: Low Risk  (02/04/2023)   Overall Financial Resource Strain (CARDIA)    Difficulty of Paying Living  Expenses: Not hard at all  Food Insecurity: No Food Insecurity (02/04/2023)   Hunger Vital Sign    Worried About Running Out of Food in the Last Year: Never true    Ran Out of Food in the Last Year: Never true  Transportation Needs: No Transportation Needs (02/04/2023)   PRAPARE - Administrator, Civil Service (Medical): No    Lack of Transportation (Non-Medical): No  Physical Activity: Sufficiently Active (02/04/2023)   Exercise Vital Sign    Days of Exercise per Week: 7 days    Minutes of Exercise per Session: 30 min  Stress: No Stress Concern Present (02/04/2023)   [employer] of Occupational Health - Occupational Stress Questionnaire    Feeling of Stress : Not at all  Social Connections: Socially Integrated (02/04/2023)   Social Connection and Isolation Panel [NHANES]    Frequency of Communication with Friends and Family: More than three times a week    Frequency of Social Gatherings with Friends and Family: More than three times a week    Attends Religious Services: More than 4 times per year    Active Member of [employer] or Organizations: Yes    Attends Engineer, structural: More than 4 times per year    Marital Status: Married  Catering manager Violence: Not At Risk (02/04/2023)   Humiliation, Afraid, Rape, and Kick questionnaire    Fear of Current or Ex-Partner: No    Emotionally Abused: No    Physically Abused: No    Sexually Abused: No     Review of Systems: General: negative for chills, fever, night sweats or weight changes.  Cardiovascular: negative for chest pain, dyspnea on exertion, edema, orthopnea, palpitations, paroxysmal nocturnal dyspnea or shortness of breath Dermatological: negative for rash Respiratory: negative for cough or wheezing Urologic: negative for hematuria Abdominal: negative for nausea, vomiting, diarrhea, bright red blood per rectum, melena, or hematemesis Neurologic: negative for visual changes, syncope, or dizziness All  other systems reviewed and are otherwise negative except as noted above.    Blood pressure 124/62, pulse (!) 58, height 5\' 10"  (1.778 m), weight 219 lb 12.8 oz (99.7 kg), SpO2 98%.  General appearance: alert and no distress Neck: no adenopathy, no carotid bruit, no JVD, supple, symmetrical, trachea midline, and thyroid not enlarged, symmetric, no tenderness/mass/nodules Lungs: clear to auscultation bilaterally Heart: regular rate and rhythm, S1, S2 normal, no murmur, click, rub or gallop Extremities: extremities normal, atraumatic, no cyanosis or edema Pulses: 2+ and symmetric Skin: Skin color, texture, turgor normal. No rashes or lesions Neurologic: Grossly normal  EKG EKG Interpretation Date/Time:  Tuesday June 28 2023 10:09:12 EDT Ventricular Rate:  58 PR Interval:  176 QRS Duration:  88 QT Interval:  400 QTC Calculation: 392 R Axis:   108  Text Interpretation: Sinus bradycardia Rightward axis When compared with ECG of 04-Jan-2020 05:20, Fusion complexes are no longer Present Premature ventricular complexes are  no longer Present Confirmed by Lauro Portal [phone]) on 06/28/2023 10:18:50 AM    ASSESSMENT AND PLAN:   CAD (coronary artery disease) History of CAD status post bare-metal stenting of a large second diagonal branch that was occluded by myself 01/27/2009 in the setting of myocardial infarction.  His EF at that time was 45 to 50%.  His EF ultimately normalized.  He did have scar on Myoview  stress testing.  I recath him because of exertional chest pain and an abnormal Myoview  stress test 11//21 revealing a patent diagonal branch with a 95% proximal AV groove circumflex which I stented using a Synergy 3 mm x 16 mm long drug-eluting stent postdilated to 3.3 mm.  His symptoms resolved.  He remains on dual antiplatelet therapy.  He denies chest pain or shortness of breath.  Hyperlipidemia History of hyperlipidemia on high dose atorvastatin  with lipid profile performed 03/03/2023  revealing total cholesterol 110, LDL 56 and HDL 41.     Avanell Leigh MD FACP,FACC,FAHA, [facility] 06/28/2023 10:23 AM

## 2023-06-28 NOTE — Patient Instructions (Signed)
 Medication Instructions:  Your physician recommends that you continue on your current medications as directed. Please refer to the Current Medication list given to you today.  *If you need a refill on your cardiac medications before your next appointment, please call your pharmacy*  Follow-Up: At [facility], you and your health needs are our priority.  As part of our continuing mission to provide you with exceptional heart care, our providers are all part of one team.  This team includes your primary Cardiologist (physician) and Advanced Practice Providers or APPs (Physician Assistants and Nurse Practitioners) who all work together to provide you with the care you need, when you need it.  Your next appointment:   12 month(s)  Provider:   Lauro Portal, MD    We recommend signing up for the patient portal called "MyChart".  Sign up information is provided on this After Visit Summary.  MyChart is used to connect with patients for Virtual Visits (Telemedicine).  Patients are able to view lab/test results, encounter notes, upcoming appointments, etc.  Non-urgent messages can be sent to your provider as well.   To learn more about what you can do with MyChart, go to [email or web address].

## 2023-08-17 ENCOUNTER — Telehealth: Payer: Self-pay | Admitting: Pharmacist

## 2023-08-17 NOTE — Telephone Encounter (Signed)
   This patient is appearing on a report for being at risk of failing the adherence measure for diabetes medications this calendar year.   Medication: metformin  Last fill date: 08/16/23 for 60 day supply  Insurance report was not up to date. No action needed at this time.  Consider XR/ER formulation if GI upset On statin (compliant) On DAPT (sees cards)   Marvell Slider, PharmD, BCACP, CPP Clinical Pharmacist, [facility]

## 2023-09-05 ENCOUNTER — Ambulatory Visit: Payer: Self-pay | Admitting: Family Medicine

## 2023-09-09 ENCOUNTER — Ambulatory Visit: Admitting: Family Medicine

## 2023-09-09 VITALS — BP 130/72 | HR 62 | Temp 97.9°F | Ht 70.0 in | Wt 219.6 lb

## 2023-09-09 DIAGNOSIS — L03011 Cellulitis of right finger: Secondary | ICD-10-CM

## 2023-09-09 MED ORDER — DOXYCYCLINE HYCLATE 100 MG PO TABS: 100.0000 mg | ORAL_TABLET | Freq: Two times a day (BID) | ORAL | 0 refills | Status: DC

## 2023-09-09 MED ORDER — PENICILLIN V POTASSIUM 500 MG PO TABS: ORAL_TABLET | ORAL | 0 refills | Status: DC

## 2023-09-09 NOTE — Progress Notes (Signed)
   Subjective:    Patient ID: Connor Sanford, male    DOB: [birth year], 68 y.o.   MRN: [id number]  HPI Patient with abscess on his finger.  He relates pain discomfort on the index finger.  Present for the past several days.  Red swollen tender Denies any injury Also has a bad tooth he is going to see the dentist next week No high fever chills or sweats   Review of Systems     Objective:   Physical Exam Patient does have abscess no cellulitis of the finger Shared discussion Area was cleansed with Betadine Sprayed with numbing spray Lidocaine  without epi was injected 11.  Blade was used to make a small cut drained a large amount of pus        Assessment & Plan:  Finger abscess with cellulitis doxycycline  twice daily take with a snack tall glass of water  for 7 days Follow-up if progressive troubles or worse Toothache with tooth infection recommend penicillin  VK 4 times daily for 5 to 7 days see dentist

## 2023-10-19 ENCOUNTER — Other Ambulatory Visit: Payer: Self-pay | Admitting: Cardiovascular Disease

## 2023-10-19 DIAGNOSIS — I2510 Atherosclerotic heart disease of native coronary artery without angina pectoris: Secondary | ICD-10-CM

## 2023-10-21 NOTE — Progress Notes (Signed)
 Pharmacy Quality Measure Review  This patient is appearing on a report for being at risk of failing the adherence measure for cholesterol (statin) medications this calendar year.   Medication: atorvastatin  80 mg daily Last fill date: 10/19/23 for 90 day supply  Insurance report was not up to date. No action needed at this time.   Woodie Jock, PharmD PGY1 Pharmacy Resident

## 2023-11-07 ENCOUNTER — Telehealth: Payer: Self-pay | Admitting: Pharmacist

## 2023-11-07 NOTE — Telephone Encounter (Signed)
   This patient is appearing on a report for being at risk of failing the adherence measure for cholesterol (statin), diabetes, and hypertension (ACEi/ARB) medications this calendar year.   Reviewed metformin  and statin  Insurance report was not up to date. No action needed at this time.    [name] [name], PharmD, BCACP, CPP Clinical Pharmacist, [facility]

## 2023-12-23 NOTE — Telephone Encounter (Signed)
 [name]

## 2024-01-20 ENCOUNTER — Other Ambulatory Visit: Payer: Self-pay | Admitting: Cardiovascular Disease

## 2024-01-20 DIAGNOSIS — I2510 Atherosclerotic heart disease of native coronary artery without angina pectoris: Secondary | ICD-10-CM

## 2024-01-24 ENCOUNTER — Other Ambulatory Visit: Payer: Self-pay | Admitting: Cardiovascular Disease

## 2024-01-24 DIAGNOSIS — I2510 Atherosclerotic heart disease of native coronary artery without angina pectoris: Secondary | ICD-10-CM

## 2024-01-24 MED ORDER — ATORVASTATIN CALCIUM 80 MG PO TABS: 80.0000 mg | ORAL_TABLET | Freq: Every day | ORAL | 1 refills | Status: AC

## 2024-02-25 ENCOUNTER — Other Ambulatory Visit: Payer: Self-pay | Admitting: Family Medicine

## 2024-02-25 DIAGNOSIS — E119 Type 2 diabetes mellitus without complications: Secondary | ICD-10-CM

## 2024-03-16 ENCOUNTER — Ambulatory Visit (INDEPENDENT_AMBULATORY_CARE_PROVIDER_SITE_OTHER)

## 2024-03-16 VITALS — Ht 70.0 in | Wt 219.0 lb

## 2024-03-16 DIAGNOSIS — Z0000 Encounter for general adult medical examination without abnormal findings: Secondary | ICD-10-CM | POA: Diagnosis not present

## 2024-03-16 NOTE — Progress Notes (Signed)
 "  Chief Complaint  Patient presents with   Medicare Wellness     Subjective:   Connor Sanford is a 69 y.o. male who presents for a Medicare Annual Wellness Visit.  Visit info / Clinical Intake: Medicare Wellness Visit Type:: Subsequent Annual Wellness Visit Persons participating in visit and providing information:: patient Medicare Wellness Visit Mode:: Telephone If telephone:: video declined Since this visit was completed virtually, some vitals may be partially provided or unavailable. Missing vitals are due to the limitations of the virtual format.: Documented vitals are patient reported If Telephone or Video please confirm:: I connected with patient using audio/video enable telemedicine. I verified patient identity with two identifiers, discussed telehealth limitations, and patient agreed to proceed. Patient Location:: home Provider Location:: office Interpreter Needed?: No Pre-visit prep was completed: yes AWV questionnaire completed by patient prior to visit?: no Living arrangements:: lives with spouse/significant other Patient's Overall Health Status Rating: very good Typical amount of pain: some Does pain affect daily life?: no Are you currently prescribed opioids?: no  Dietary Habits and Nutritional Risks How many meals a day?: 3 Eats fruit and vegetables daily?: yes Most meals are obtained by: having others provide food; eating out In the last 2 weeks, have you had any of the following?: none Diabetic:: (!) yes Any non-healing wounds?: no How often do you check your BS?: as needed Would you like to be referred to a Nutritionist or for Diabetic Management? : no  Functional Status Activities of Daily Living (to include ambulation/medication): Independent Ambulation: Independent Medication Administration: Independent Home Management (perform basic housework or laundry): Independent Manage your own finances?: yes Primary transportation is: driving Concerns about  vision?: no *vision screening is required for WTM* Concerns about hearing?: no  Fall Screening Falls in the past year?: 0 Number of falls in past year: 0 Was there an injury with Fall?: 0 Fall Risk Category Calculator: 0 Patient Fall Risk Level: Low Fall Risk  Fall Risk Patient at Risk for Falls Due to: No Fall Risks Fall risk Follow up: Falls evaluation completed; Education provided; Falls prevention discussed  Home and Transportation Safety: All rugs have non-skid backing?: yes All stairs or steps have railings?: yes Grab bars in the bathtub or shower?: yes Have non-skid surface in bathtub or shower?: yes Good home lighting?: yes Regular seat belt use?: yes Hospital stays in the last year:: no  Cognitive Assessment Difficulty concentrating, remembering, or making decisions? : no Will 6CIT or Mini Cog be Completed: no 6CIT or Mini Cog Declined: patient alert, oriented, able to answer questions appropriately and recall recent events  Advance Directives (For Healthcare) Does Patient Have a Medical Advance Directive?: No Would patient like information on creating a medical advance directive?: Yes (MAU/Ambulatory/Procedural Areas - Information given)  Reviewed/Updated  Reviewed/Updated: Reviewed All (Medical, Surgical, Family, Medications, Allergies, Care Teams, Patient Goals)    Allergies (verified) Patient has no known allergies.   Current Medications (verified) Outpatient Encounter Medications as of 03/16/2024  Medication Sig   aspirin  EC 81 MG tablet Take 81 mg by mouth every evening.   atorvastatin  (LIPITOR) 80 MG tablet Take 1 tablet (80 mg total) by mouth daily.   cetirizine (ZYRTEC) 10 MG tablet Take 10 mg by mouth daily as needed (allergies).   clobetasol  ointment (TEMOVATE ) 0.05 % Apply 1 Application topically 2 (two) times daily as needed.   clopidogrel  (PLAVIX ) 75 MG tablet Take 1 tablet (75 mg total) by mouth daily.   Coenzyme Q10 (COQ10) 100 MG CAPS  Take 100  mg by mouth daily.   fluticasone  (FLONASE ) 50 MCG/ACT nasal spray 2 sprays each nare daily (Patient taking differently: Place 1 spray into both nostrils daily as needed for allergies.)   Menthol, Topical Analgesic, (BIOFREEZE) 4 % GEL Apply 1 application topically daily as needed (pain).   metFORMIN  (GLUCOPHAGE ) 500 MG tablet TAKE ONE TABLET (500MG  TOTAL) BY MOUTH TWO TIMES DAILY WITH A MEAL   metoprolol  tartrate (LOPRESSOR ) 25 MG tablet TAKE ONE-HALF TABLET (12.5MG  TOTAL) BY MOUTH TWO TIMES DAILY   Multiple Vitamins-Minerals (MULTIVITAMIN WITH IRON-MINERALS) liquid Take 30 mLs by mouth daily.   nitroGLYCERIN  (NITROSTAT ) 0.4 MG SL tablet Place 1 tablet (0.4 mg total) under the tongue every 5 (five) minutes as needed for chest pain.   [DISCONTINUED] doxycycline  (VIBRA -TABS) 100 MG tablet Take 1 tablet (100 mg total) by mouth 2 (two) times daily.   [DISCONTINUED] penicillin  v potassium (VEETID) 500 MG tablet One qid for 7 days for tooth infection   No facility-administered encounter medications on file as of 03/16/2024.    History: Past Medical History:  Diagnosis Date   Allergy    Anxiety    Coronary artery disease    H/O seasonal allergies    Hyperlipidemia    Hypertension    MI (myocardial infarction) (HCC) 03/01/2008   Past Surgical History:  Procedure Laterality Date   CARDIAC CATHETERIZATION  01/27/2009   Occluded second diagonal branch-stented with a 2.25x46mm Integrity Medtronic bare-metal stent at 12 atm (2.38-mm) resulting in reduction of 100% stenosis to 0% residual. Small fairly focal stenosis just distalof stented segment-stented with a 2.25x59mm Integrity Medtronic bare-metal stent at 12 atm, reduced to 0% residual with TIMI3 flow.   CARDIOVASCULAR STRESS TEST  04/16/2009   Mild perfusion defect seen in Mid Anterior, Apical Anterior, and Apical regions - consistent with infarct/scar. No scintigraphic evidence of inducible myocardial ischemia. EKG negative for ischemia. No ECG  changes.   COLONOSCOPY N/A 12/27/2012   Procedure: COLONOSCOPY;  Surgeon: Claudis RAYMOND Rivet, MD;  Location: [facility];  Service: Endoscopy;  Laterality: N/A;  730   COLONOSCOPY WITH PROPOFOL  N/A 10/07/2021   Procedure: COLONOSCOPY WITH PROPOFOL ;  Surgeon: Eartha Angelia Sieving, MD;  Location: [facility];  Service: Gastroenterology;  Laterality: N/A;  830 ASA 2   CORONARY ANGIOPLASTY     2 stents   CORONARY STENT INTERVENTION  01/03/2020   Procedure: CORONARY STENT INTERVENTION;  Surgeon: Court Dorn PARAS, MD;  Location: [facility];  Service: Cardiovascular;;   LEFT HEART CATH AND CORONARY ANGIOGRAPHY N/A 01/03/2020   Procedure: LEFT HEART CATH AND CORONARY ANGIOGRAPHY;  Surgeon: Court Dorn PARAS, MD;  Location: [facility];  Service: Cardiovascular;  Laterality: N/A;   POLYPECTOMY  10/07/2021   Procedure: POLYPECTOMY INTESTINAL;  Surgeon: Eartha Angelia Sieving, MD;  Location: [facility];  Service: Gastroenterology;;   RADIOLOGY WITH ANESTHESIA N/A 10/30/2020   Procedure: MRI BRAIN WITH AND WITHOUT CONTRAST;  Surgeon: Radiologist, Medication, MD;  Location: [facility] OR;  Service: Radiology;  Laterality: N/A;   RESECTION DISTAL CLAVICAL Left 06/13/2013   Procedure: RESECTION DISTAL CLAVICAL;  Surgeon: Taft FORBES Minerva, MD;  Location: [facility];  Service: Orthopedics;  Laterality: Left;   SHOULDER OPEN ROTATOR CUFF REPAIR Left 06/13/2013   Procedure: ROTATOR CUFF REPAIR SHOULDER OPEN;  Surgeon: Taft FORBES Minerva, MD;  Location: [facility];  Service: Orthopedics;  Laterality: Left;   TRANSTHORACIC ECHOCARDIOGRAM  04/16/2009   EF >55%, no significant valvular abnormalities.   Family  History  Problem Relation Age of Onset   Heart disease Father    Heart disease Paternal Grandmother    Cancer Mother    Colon cancer Neg Hx    Social History   Occupational History   Not on file  Tobacco Use   Smoking status: Never   Smokeless tobacco: Never  Vaping Use   Vaping status: Never  Used  Substance and Sexual Activity   Alcohol use: No   Drug use: No   Sexual activity: Yes    Birth control/protection: None   Tobacco Counseling Counseling given: Not Answered  SDOH Screenings   Food Insecurity: No Food Insecurity (03/16/2024)  Housing: Low Risk (03/16/2024)  Transportation Needs: No Transportation Needs (03/16/2024)  Utilities: Not At Risk (03/16/2024)  Alcohol Screen: Low Risk (02/04/2023)  Depression (PHQ2-9): Low Risk (03/16/2024)  Financial Resource Strain: Low Risk (09/09/2023)  Physical Activity: Sufficiently Active (03/16/2024)  Social Connections: Socially Integrated (03/16/2024)  Stress: No Stress Concern Present (03/16/2024)  Tobacco Use: Low Risk (03/16/2024)  Health Literacy: Adequate Health Literacy (03/16/2024)   See flowsheets for full screening details  Depression Screen PHQ 2 & 9 Depression Scale- Over the past 2 weeks, how often have you been bothered by any of the following problems? Little interest or pleasure in doing things: 0 Feeling down, depressed, or hopeless (PHQ Adolescent also includes...irritable): 0 PHQ-2 Total Score: 0 Trouble falling or staying asleep, or sleeping too much: 0 Feeling tired or having little energy: 0 Poor appetite or overeating (PHQ Adolescent also includes...weight loss): 0 Feeling bad about yourself - or that you are a failure or have let yourself or your family down: 0 Trouble concentrating on things, such as reading the newspaper or watching television (PHQ Adolescent also includes...like school work): 0 Moving or speaking so slowly that other people could have noticed. Or the opposite - being so fidgety or restless that you have been moving around a lot more than usual: 0 Thoughts that you would be better off dead, or of hurting yourself in some way: 0 PHQ-9 Total Score: 0     Goals Addressed             This Visit's Progress    Maintain health and independence   On track            Objective:     Today's Vitals   03/16/24 1150  Weight: 219 lb (99.3 kg)  Height: 5' 10 (1.778 m)   Body mass index is 31.42 kg/m.  Hearing/Vision screen Hearing Screening - Comments:: Patient is able to hear conversational tones without difficulty. No issues reported.   Vision Screening - Comments:: Wears rx glasses - up to date with routine eye exams with Dr. Darroll  Immunizations and Health Maintenance Health Maintenance  Topic Date Due   OPHTHALMOLOGY EXAM  08/16/2023   HEMOGLOBIN A1C  08/31/2023   Influenza Vaccine  09/30/2023   Diabetic kidney evaluation - eGFR measurement  03/02/2024   Diabetic kidney evaluation - Urine ACR  03/02/2024   FOOT EXAM  03/07/2024   Colonoscopy  10/07/2024   Medicare Annual Wellness (AWV)  03/16/2025   DTaP/Tdap/Td (3 - Td or Tdap) 08/14/2031   Pneumococcal Vaccine: 50+ Years  Completed   Zoster Vaccines- Shingrix  Completed   Meningococcal B Vaccine  Aged Out   COVID-19 Vaccine  Discontinued   Hepatitis C Screening  Discontinued        Assessment/Plan:  This is a routine wellness examination for Connor Sanford.  Patient Care Team: Cook, Jayce G, DO as PCP - General (Family Medicine) Court Dorn PARAS, MD as PCP - Cardiology (Cardiology) Darroll Anes, DO [facility])  I have personally reviewed and noted the following in the patients chart:   Medical and social history Use of alcohol, tobacco or illicit drugs  Current medications and supplements including opioid prescriptions. Functional ability and status Nutritional status Physical activity Advanced directives List of other physicians Hospitalizations, surgeries, and ER visits in previous 12 months Vitals Screenings to include cognitive, depression, and falls Referrals and appointments  No orders of the defined types were placed in this encounter.  In addition, I have reviewed and discussed with patient certain preventive protocols, quality metrics, and best practice recommendations. A  written personalized care plan for preventive services as well as general preventive health recommendations were provided to patient.   Connor Charmaine Browner, LPN   8/83/7973   Return in 1 year (on 03/16/2025).  After Visit Summary: (MyChart) Due to this being a telephonic visit, the after visit summary with patients personalized plan was offered to patient via MyChart   Nurse Notes: No voiced or noted concerns at this time  "

## 2024-03-16 NOTE — Patient Instructions (Signed)
 Connor Sanford,  Thank you for taking the time for your Medicare Wellness Visit. I appreciate your continued commitment to your health goals. Please review the care plan we discussed, and feel free to reach out if I can assist you further.  Please note that Annual Wellness Visits do not include a physical exam. Some assessments may be limited, especially if the visit was conducted virtually. If needed, we may recommend an in-person follow-up with your provider.  Ongoing Care Seeing your primary care provider every 3 to 6 months helps us  monitor your health and provide consistent, personalized care.   Referrals If a referral was made during today's visit and you haven't received any updates within two weeks, please contact the referred provider directly to check on the status.  Recommended Screenings:  Health Maintenance  Topic Date Due   Eye exam for diabetics  08/16/2023   Hemoglobin A1C  08/31/2023   Flu Shot  09/30/2023   Yearly kidney function blood test for diabetes  03/02/2024   Kidney health urinalysis for diabetes  03/02/2024   Complete foot exam   03/07/2024   Colon Cancer Screening  10/07/2024   Medicare Annual Wellness Visit  03/16/2025   DTaP/Tdap/Td vaccine (3 - Td or Tdap) 08/14/2031   Pneumococcal Vaccine for age over 1  Completed   Zoster (Shingles) Vaccine  Completed   Meningitis B Vaccine  Aged Out   COVID-19 Vaccine  Discontinued   Hepatitis C Screening  Discontinued       03/16/2024   11:51 AM  Advanced Directives  Does Patient Have a Medical Advance Directive? No  Would patient like information on creating a medical advance directive? Yes (MAU/Ambulatory/Procedural Areas - Information given)   Information on Advanced Care Planning can be found at [place]  Secretary of [facility] Advance Health Care Directives Advance Health Care Directives ([email or web address]   Vision: Annual vision screenings are recommended for early detection of glaucoma, cataracts, and  diabetic retinopathy. These exams can also reveal signs of chronic conditions such as diabetes and high blood pressure.  Dental: Annual dental screenings help detect early signs of oral cancer, gum disease, and other conditions linked to overall health, including heart disease and diabetes.  Please see the attached documents for additional preventive care recommendations.
